# Patient Record
Sex: Male | Born: 1935 | Race: White | Hispanic: No | Marital: Married | Smoking: Former smoker
Health system: Southern US, Community
[De-identification: ages and names within clinical notes are randomized; demographics above are authoritative.]

## PROBLEM LIST (undated history)

## (undated) DIAGNOSIS — K210 Gastro-esophageal reflux disease with esophagitis: Secondary | ICD-10-CM

## (undated) DIAGNOSIS — M1990 Unspecified osteoarthritis, unspecified site: Secondary | ICD-10-CM

## (undated) DIAGNOSIS — Z8546 Personal history of malignant neoplasm of prostate: Secondary | ICD-10-CM

## (undated) DIAGNOSIS — I2510 Atherosclerotic heart disease of native coronary artery without angina pectoris: Secondary | ICD-10-CM

## (undated) DIAGNOSIS — E538 Deficiency of other specified B group vitamins: Secondary | ICD-10-CM

## (undated) DIAGNOSIS — L570 Actinic keratosis: Secondary | ICD-10-CM

## (undated) DIAGNOSIS — I10 Essential (primary) hypertension: Secondary | ICD-10-CM

## (undated) DIAGNOSIS — M1710 Unilateral primary osteoarthritis, unspecified knee: Secondary | ICD-10-CM

## (undated) DIAGNOSIS — M159 Polyosteoarthritis, unspecified: Secondary | ICD-10-CM

## (undated) DIAGNOSIS — K259 Gastric ulcer, unspecified as acute or chronic, without hemorrhage or perforation: Secondary | ICD-10-CM

## (undated) DIAGNOSIS — I872 Venous insufficiency (chronic) (peripheral): Secondary | ICD-10-CM

## (undated) DIAGNOSIS — E1159 Type 2 diabetes mellitus with other circulatory complications: Secondary | ICD-10-CM

## (undated) DIAGNOSIS — H3530 Unspecified macular degeneration: Secondary | ICD-10-CM

## (undated) DIAGNOSIS — Z86010 Personal history of colonic polyps: Secondary | ICD-10-CM

## (undated) DIAGNOSIS — G309 Alzheimer's disease, unspecified: Secondary | ICD-10-CM

## (undated) DIAGNOSIS — M542 Cervicalgia: Secondary | ICD-10-CM

## (undated) DIAGNOSIS — H409 Unspecified glaucoma: Secondary | ICD-10-CM

## (undated) DIAGNOSIS — F0280 Dementia in other diseases classified elsewhere without behavioral disturbance: Secondary | ICD-10-CM

## (undated) DIAGNOSIS — G40909 Epilepsy, unspecified, not intractable, without status epilepticus: Secondary | ICD-10-CM

## (undated) DIAGNOSIS — E785 Hyperlipidemia, unspecified: Secondary | ICD-10-CM

## (undated) DIAGNOSIS — H9190 Unspecified hearing loss, unspecified ear: Secondary | ICD-10-CM

## (undated) DIAGNOSIS — F329 Major depressive disorder, single episode, unspecified: Secondary | ICD-10-CM

## (undated) DIAGNOSIS — D649 Anemia, unspecified: Secondary | ICD-10-CM

## (undated) DIAGNOSIS — E559 Vitamin D deficiency, unspecified: Secondary | ICD-10-CM

## (undated) DIAGNOSIS — Z9181 History of falling: Secondary | ICD-10-CM

## (undated) HISTORY — DX: Epilepsy, unspecified, not intractable, without status epilepticus: G40.909

## (undated) HISTORY — DX: Unspecified hearing loss, unspecified ear: H91.90

## (undated) HISTORY — PX: MYRINGOTOMY: SHX2060

## (undated) HISTORY — DX: Gastric ulcer, unspecified as acute or chronic, without hemorrhage or perforation: K25.9

## (undated) HISTORY — DX: Essential (primary) hypertension: I10

## (undated) HISTORY — DX: Gastro-esophageal reflux disease with esophagitis: K21.0

## (undated) HISTORY — DX: Actinic keratosis: L57.0

## (undated) HISTORY — DX: Unilateral primary osteoarthritis, unspecified knee: M17.10

## (undated) HISTORY — DX: Type 2 diabetes mellitus with other circulatory complications: E11.59

## (undated) HISTORY — DX: History of falling: Z91.81

## (undated) HISTORY — DX: Cervicalgia: M54.2

## (undated) HISTORY — DX: Personal history of colonic polyps: Z86.010

## (undated) HISTORY — DX: Unspecified osteoarthritis, unspecified site: M19.90

## (undated) HISTORY — DX: Anemia, unspecified: D64.9

## (undated) HISTORY — DX: Deficiency of other specified B group vitamins: E53.8

## (undated) HISTORY — DX: Hyperlipidemia, unspecified: E78.5

## (undated) HISTORY — DX: Dementia in other diseases classified elsewhere without behavioral disturbance: F02.80

## (undated) HISTORY — DX: Polyosteoarthritis, unspecified: M15.9

## (undated) HISTORY — DX: Unspecified macular degeneration: H35.30

## (undated) HISTORY — DX: Major depressive disorder, single episode, unspecified: F32.9

## (undated) HISTORY — DX: Unspecified glaucoma: H40.9

## (undated) HISTORY — DX: Venous insufficiency (chronic) (peripheral): I87.2

## (undated) HISTORY — DX: Alzheimer's disease, unspecified: G30.9

## (undated) HISTORY — DX: Vitamin D deficiency, unspecified: E55.9

## (undated) HISTORY — DX: Atherosclerotic heart disease of native coronary artery without angina pectoris: I25.10

## (undated) HISTORY — DX: Personal history of malignant neoplasm of prostate: Z85.46

---
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
## 1996-07-22 DIAGNOSIS — M542 Cervicalgia: Secondary | ICD-10-CM

## 1996-07-22 HISTORY — DX: Cervicalgia: M54.2

## 1998-05-24 ENCOUNTER — Ambulatory Visit (HOSPITAL_COMMUNITY): Admission: RE | Admit: 1998-05-24 | Discharge: 1998-05-24 | Payer: Self-pay | Admitting: Family Medicine

## 2000-07-22 DIAGNOSIS — K2100 Gastro-esophageal reflux disease with esophagitis, without bleeding: Secondary | ICD-10-CM

## 2000-07-22 DIAGNOSIS — I2510 Atherosclerotic heart disease of native coronary artery without angina pectoris: Secondary | ICD-10-CM

## 2000-07-22 HISTORY — DX: Gastro-esophageal reflux disease with esophagitis, without bleeding: K21.00

## 2000-07-22 HISTORY — DX: Atherosclerotic heart disease of native coronary artery without angina pectoris: I25.10

## 2000-08-26 ENCOUNTER — Inpatient Hospital Stay (HOSPITAL_COMMUNITY): Admission: EM | Admit: 2000-08-26 | Discharge: 2000-08-28 | Payer: Self-pay | Admitting: Emergency Medicine

## 2000-08-26 ENCOUNTER — Encounter: Payer: Self-pay | Admitting: Emergency Medicine

## 2000-08-27 ENCOUNTER — Encounter: Payer: Self-pay | Admitting: Cardiology

## 2000-09-11 ENCOUNTER — Encounter: Payer: Self-pay | Admitting: Cardiology

## 2000-09-11 ENCOUNTER — Encounter: Admission: RE | Admit: 2000-09-11 | Discharge: 2000-09-11 | Payer: Self-pay | Admitting: Cardiology

## 2002-05-12 ENCOUNTER — Encounter: Payer: Self-pay | Admitting: Gastroenterology

## 2003-07-23 HISTORY — PX: CARPAL TUNNEL RELEASE: SHX101

## 2003-09-12 ENCOUNTER — Encounter: Admission: RE | Admit: 2003-09-12 | Discharge: 2003-09-12 | Payer: Self-pay | Admitting: Orthopedic Surgery

## 2003-09-13 ENCOUNTER — Ambulatory Visit (HOSPITAL_BASED_OUTPATIENT_CLINIC_OR_DEPARTMENT_OTHER): Admission: RE | Admit: 2003-09-13 | Discharge: 2003-09-13 | Payer: Self-pay | Admitting: Orthopedic Surgery

## 2003-09-13 ENCOUNTER — Ambulatory Visit (HOSPITAL_COMMUNITY): Admission: RE | Admit: 2003-09-13 | Discharge: 2003-09-13 | Payer: Self-pay | Admitting: Orthopedic Surgery

## 2003-11-08 ENCOUNTER — Ambulatory Visit: Admission: RE | Admit: 2003-11-08 | Discharge: 2003-11-08 | Payer: Self-pay | Admitting: Orthopedic Surgery

## 2003-11-08 ENCOUNTER — Ambulatory Visit (HOSPITAL_BASED_OUTPATIENT_CLINIC_OR_DEPARTMENT_OTHER): Admission: RE | Admit: 2003-11-08 | Discharge: 2003-11-08 | Payer: Self-pay | Admitting: Orthopedic Surgery

## 2006-07-22 DIAGNOSIS — I10 Essential (primary) hypertension: Secondary | ICD-10-CM

## 2006-07-22 HISTORY — DX: Essential (primary) hypertension: I10

## 2008-05-26 ENCOUNTER — Encounter: Admission: RE | Admit: 2008-05-26 | Discharge: 2008-05-26 | Payer: Self-pay | Admitting: Family Medicine

## 2008-07-22 DIAGNOSIS — E559 Vitamin D deficiency, unspecified: Secondary | ICD-10-CM

## 2008-07-22 HISTORY — DX: Vitamin D deficiency, unspecified: E55.9

## 2008-09-13 DIAGNOSIS — F0280 Dementia in other diseases classified elsewhere without behavioral disturbance: Secondary | ICD-10-CM

## 2008-09-13 HISTORY — DX: Dementia in other diseases classified elsewhere, unspecified severity, without behavioral disturbance, psychotic disturbance, mood disturbance, and anxiety: F02.80

## 2008-10-06 ENCOUNTER — Encounter: Payer: Self-pay | Admitting: Internal Medicine

## 2009-04-20 ENCOUNTER — Encounter: Admission: RE | Admit: 2009-04-20 | Discharge: 2009-04-20 | Payer: Self-pay | Admitting: Family Medicine

## 2009-05-18 ENCOUNTER — Ambulatory Visit: Payer: Self-pay

## 2009-05-18 ENCOUNTER — Encounter: Payer: Self-pay | Admitting: Internal Medicine

## 2009-05-18 ENCOUNTER — Ambulatory Visit (HOSPITAL_COMMUNITY): Admission: RE | Admit: 2009-05-18 | Discharge: 2009-05-18 | Payer: Self-pay | Admitting: Internal Medicine

## 2009-05-18 ENCOUNTER — Ambulatory Visit: Payer: Self-pay | Admitting: Cardiovascular Disease

## 2009-05-24 ENCOUNTER — Ambulatory Visit: Payer: Self-pay | Admitting: Internal Medicine

## 2009-05-24 DIAGNOSIS — R413 Other amnesia: Secondary | ICD-10-CM

## 2009-05-24 DIAGNOSIS — E1159 Type 2 diabetes mellitus with other circulatory complications: Secondary | ICD-10-CM

## 2009-05-24 DIAGNOSIS — R55 Syncope and collapse: Secondary | ICD-10-CM

## 2009-05-24 DIAGNOSIS — E119 Type 2 diabetes mellitus without complications: Secondary | ICD-10-CM

## 2009-05-24 DIAGNOSIS — I10 Essential (primary) hypertension: Secondary | ICD-10-CM

## 2009-05-24 HISTORY — DX: Type 2 diabetes mellitus with other circulatory complications: E11.59

## 2009-05-29 ENCOUNTER — Ambulatory Visit: Payer: Self-pay | Admitting: Internal Medicine

## 2009-06-07 ENCOUNTER — Telehealth: Payer: Self-pay | Admitting: Internal Medicine

## 2009-06-21 ENCOUNTER — Encounter (INDEPENDENT_AMBULATORY_CARE_PROVIDER_SITE_OTHER): Payer: Self-pay | Admitting: *Deleted

## 2009-07-04 ENCOUNTER — Telehealth: Payer: Self-pay | Admitting: Internal Medicine

## 2009-07-11 ENCOUNTER — Telehealth (INDEPENDENT_AMBULATORY_CARE_PROVIDER_SITE_OTHER): Payer: Self-pay | Admitting: *Deleted

## 2009-07-22 DIAGNOSIS — Z8546 Personal history of malignant neoplasm of prostate: Secondary | ICD-10-CM

## 2009-07-22 HISTORY — DX: Personal history of malignant neoplasm of prostate: Z85.46

## 2009-08-21 ENCOUNTER — Encounter: Admission: RE | Admit: 2009-08-21 | Discharge: 2009-08-21 | Payer: Self-pay | Admitting: Family Medicine

## 2009-08-21 ENCOUNTER — Encounter: Payer: Self-pay | Admitting: Gastroenterology

## 2009-10-02 ENCOUNTER — Encounter: Payer: Self-pay | Admitting: Gastroenterology

## 2009-10-11 ENCOUNTER — Encounter: Payer: Self-pay | Admitting: Gastroenterology

## 2009-10-12 ENCOUNTER — Telehealth: Payer: Self-pay | Admitting: Gastroenterology

## 2009-11-21 ENCOUNTER — Encounter: Admission: RE | Admit: 2009-11-21 | Discharge: 2009-11-21 | Payer: Self-pay | Admitting: Neurology

## 2010-01-10 ENCOUNTER — Ambulatory Visit (HOSPITAL_COMMUNITY): Admission: RE | Admit: 2010-01-10 | Discharge: 2010-01-10 | Payer: Self-pay | Admitting: Urology

## 2010-01-18 ENCOUNTER — Ambulatory Visit (HOSPITAL_COMMUNITY): Admission: RE | Admit: 2010-01-18 | Discharge: 2010-01-18 | Payer: Self-pay | Admitting: Urology

## 2010-02-12 ENCOUNTER — Ambulatory Visit: Admission: RE | Admit: 2010-02-12 | Discharge: 2010-05-13 | Payer: Self-pay | Admitting: Radiation Oncology

## 2010-02-26 ENCOUNTER — Encounter: Payer: Self-pay | Admitting: Gastroenterology

## 2010-05-14 ENCOUNTER — Ambulatory Visit
Admission: RE | Admit: 2010-05-14 | Discharge: 2010-07-12 | Payer: Self-pay | Source: Home / Self Care | Attending: Radiation Oncology | Admitting: Radiation Oncology

## 2010-07-21 ENCOUNTER — Emergency Department (HOSPITAL_COMMUNITY)
Admission: EM | Admit: 2010-07-21 | Discharge: 2010-07-22 | Payer: Self-pay | Source: Home / Self Care | Admitting: Emergency Medicine

## 2010-07-22 DIAGNOSIS — D649 Anemia, unspecified: Secondary | ICD-10-CM

## 2010-07-22 DIAGNOSIS — Z860100 Personal history of colon polyps, unspecified: Secondary | ICD-10-CM

## 2010-07-22 DIAGNOSIS — K259 Gastric ulcer, unspecified as acute or chronic, without hemorrhage or perforation: Secondary | ICD-10-CM

## 2010-07-22 DIAGNOSIS — E538 Deficiency of other specified B group vitamins: Secondary | ICD-10-CM

## 2010-07-22 HISTORY — DX: Personal history of colon polyps, unspecified: Z86.0100

## 2010-07-22 HISTORY — DX: Anemia, unspecified: D64.9

## 2010-07-22 HISTORY — DX: Deficiency of other specified B group vitamins: E53.8

## 2010-07-22 HISTORY — DX: Gastric ulcer, unspecified as acute or chronic, without hemorrhage or perforation: K25.9

## 2010-07-22 HISTORY — DX: Personal history of colonic polyps: Z86.010

## 2010-07-30 ENCOUNTER — Encounter
Admission: RE | Admit: 2010-07-30 | Discharge: 2010-07-30 | Payer: Self-pay | Source: Home / Self Care | Attending: Family Medicine | Admitting: Family Medicine

## 2010-07-30 ENCOUNTER — Encounter: Payer: Self-pay | Admitting: Gastroenterology

## 2010-08-06 ENCOUNTER — Encounter: Payer: Self-pay | Admitting: Gastroenterology

## 2010-08-13 ENCOUNTER — Encounter
Admission: RE | Admit: 2010-08-13 | Discharge: 2010-08-13 | Payer: Self-pay | Source: Home / Self Care | Attending: Family Medicine | Admitting: Family Medicine

## 2010-08-13 ENCOUNTER — Encounter: Payer: Self-pay | Admitting: Gastroenterology

## 2010-08-14 ENCOUNTER — Encounter: Payer: Self-pay | Admitting: Gastroenterology

## 2010-08-16 ENCOUNTER — Encounter: Payer: Self-pay | Admitting: Gastroenterology

## 2010-08-17 ENCOUNTER — Encounter: Payer: Self-pay | Admitting: Gastroenterology

## 2010-08-21 NOTE — Progress Notes (Signed)
Summary: ? Colonoscopy vs Hemocults - Dr. Arnoldo Morale  Phone Note From Other Clinic Call back at [phone]   Caller: Nurse-Sarah Dr. Arnoldo Morale  Summary of Call: Dr. Duaine Dredge would like to know what kind of screening Dr Jarold Motto would recommend at this time for [name]. He is not currently having any problems. Patients last colon was 2003 (chart requested to your desk). Dr. Arnoldo Morale would like to know if patient needs yearly hemocults or an office visit with Dr. Jarold Motto to discuss possible colonoscopy. Initial call taken by: Harlow Mares CMA Duncan Dull),  October 12, 2009 4:30 PM  Follow-up for Phone Call        Dr. Jarold Motto reviewed pt's chart.  Pt does not need colonoscopy unless he is having a problem. Follow-up by: Ashok Cordia RN,  October 13, 2009 4:21 PM  Additional Follow-up for Phone Call Additional follow up Details #1::        Sarah notified. Additional Follow-up by: Ashok Cordia RN,  October 13, 2009 4:25 PM

## 2010-08-22 ENCOUNTER — Ambulatory Visit: Payer: Medicare Other | Attending: Radiation Oncology | Admitting: Radiation Oncology

## 2010-08-23 NOTE — Letter (Signed)
Summary: New Patient letter  [facility]  [address] [place], [place] [address]   Phone: [phone]  Fax: [phone]       08/16/2010 MRN: [id number]  [facility] [address] [place], [place]  [address]  Dear [name],  Welcome to the Gastroenterology Division at [facility].    You are scheduled to see Dr.  Jarold Motto on 09-18-10 at 1:30pm  on the 3rd floor at [facility], [address]  We ask that you try to arrive at our office 15 minutes prior to your appointment time to allow for check-in.  We would like you to complete the enclosed self-administered evaluation form prior to your visit and bring it with you on the day of your appointment.  We will review it with you.  Also, please bring a complete list of all your medications or, if you prefer, bring the medication bottles and we will list them.  Please bring your insurance card so that we may make a copy of it.  If your insurance requires a referral to see a specialist, please bring your referral form from your primary care physician.  Co-payments are due at the time of your visit and may be paid by cash, check or credit card.     Your office visit will consist of a consult with your physician (includes a physical exam), any laboratory testing he/she may order, scheduling of any necessary diagnostic testing (e.g. x-ray, ultrasound, CT-scan), and scheduling of a procedure (e.g. Endoscopy, Colonoscopy) if required.  Please allow enough time on your schedule to allow for any/all of these possibilities.    If you cannot keep your appointment, please call [phone] to cancel or reschedule prior to your appointment date.  This allows [place] the opportunity to schedule an appointment for another patient in need of care.  If you do not cancel or reschedule by 5 p.m. the business day prior to your appointment date, you will be charged a $50.00 late cancellation/no-show fee.    Thank you for choosing  [facility] for your medical needs.  We appreciate the opportunity to care for you.  Please visit [place] at our website  to learn more about our practice.                     Sincerely,                                                             The Gastroenterology Division

## 2010-08-28 ENCOUNTER — Encounter: Payer: Self-pay | Admitting: Gastroenterology

## 2010-09-13 DIAGNOSIS — Z860100 Personal history of colon polyps, unspecified: Secondary | ICD-10-CM | POA: Insufficient documentation

## 2010-09-13 DIAGNOSIS — D649 Anemia, unspecified: Secondary | ICD-10-CM | POA: Insufficient documentation

## 2010-09-13 DIAGNOSIS — E559 Vitamin D deficiency, unspecified: Secondary | ICD-10-CM | POA: Insufficient documentation

## 2010-09-13 DIAGNOSIS — N402 Nodular prostate without lower urinary tract symptoms: Secondary | ICD-10-CM | POA: Insufficient documentation

## 2010-09-13 DIAGNOSIS — H9190 Unspecified hearing loss, unspecified ear: Secondary | ICD-10-CM | POA: Insufficient documentation

## 2010-09-13 DIAGNOSIS — R569 Unspecified convulsions: Secondary | ICD-10-CM | POA: Insufficient documentation

## 2010-09-13 DIAGNOSIS — I10 Essential (primary) hypertension: Secondary | ICD-10-CM | POA: Insufficient documentation

## 2010-09-13 DIAGNOSIS — I2510 Atherosclerotic heart disease of native coronary artery without angina pectoris: Secondary | ICD-10-CM | POA: Insufficient documentation

## 2010-09-18 ENCOUNTER — Ambulatory Visit (INDEPENDENT_AMBULATORY_CARE_PROVIDER_SITE_OTHER): Payer: Medicare Other | Admitting: Gastroenterology

## 2010-09-18 ENCOUNTER — Encounter: Payer: Self-pay | Admitting: Gastroenterology

## 2010-09-18 ENCOUNTER — Encounter (INDEPENDENT_AMBULATORY_CARE_PROVIDER_SITE_OTHER): Payer: Self-pay | Admitting: *Deleted

## 2010-09-18 DIAGNOSIS — D509 Iron deficiency anemia, unspecified: Secondary | ICD-10-CM | POA: Insufficient documentation

## 2010-09-18 NOTE — Procedures (Signed)
Summary: Colonoscopy   Colonoscopy  Procedure date:  05/12/2002  Findings:      Location:  [facility].   Patient Name: [name], Strauch MRN: [id number] Procedure Procedures: Colonoscopy CPT: [phone].    with polypectomy. CPT: [id number].  Personnel: Endoscopist: Vania Rea. Jarold Motto, MD.  Exam Location: Exam performed in Outpatient Clinic. Outpatient  Patient Consent: Procedure, Alternatives, Risks and Benefits discussed, consent obtained, from patient. Consent was obtained by the RN.  Indications  Average Risk Screening Routine.  History  Pre-Exam Physical: Performed May 12, 2002. Cardio-pulmonary exam, Rectal exam, Abdominal exam, Extremity exam, Mental status exam WNL.  Exam Exam: Extent of exam reached: Cecum, extent intended: Cecum.  The cecum was identified by appendiceal orifice and IC valve. Patient position: on left side. Duration of exam: 30 minutes. Colon retroflexion performed. Images taken. ASA Classification: I. Tolerance: excellent.  Monitoring: Pulse and BP monitoring, Oximetry used. Supplemental O2 given.  Colon Prep Used Golytely for colon prep. Prep results: excellent.  Sedation Meds: Patient assessed and found to be appropriate for moderate (conscious) sedation. Fentanyl 75 mcg. given IV. Versed 5 mg. given IV.  Instrument(s): CF 140L. Serial [id number].  Findings - MULTIPLE POLYPS: Transverse Colon to Descending Colon. minimum size 2 mm, maximum size 5 mm. Procedure:  snare with cautery, removed, Polyp retrieved, ICD9: Colon Polyps: 211.3.  - DIVERTICULOSIS: Descending Colon to Sigmoid Colon. Not bleeding. ICD9: Diverticulosis, Colon: 562.10.   Assessment  Diagnoses: 211.3: Colon Polyps.  562.10: Diverticulosis, Colon.   Events  Unplanned Interventions: No intervention was required.  Plans  Post Exam Instructions: No aspirin or non-steroidal containing medications: 2 weeks.  Medication Plan: Continue current  medications.  Patient Education: Patient given standard instructions for: Polyps. Diverticulosis. Patient instructed to get routine colonoscopy every 3 years.  Disposition: After procedure patient sent to recovery. After recovery patient sent home.  Scheduling/Referral: Follow-Up prn. Await pathology to schedule patient.    CC: Dahlia Client, MD  This report was created from the original endoscopy report, which was reviewed and signed by the above listed endoscopist.

## 2010-09-27 NOTE — Letter (Signed)
Summary: [facility] Instructions  [facility]  [address] [place], [place] [address]   Phone: [phone]  Fax: [phone]       [name]    1922-01-20    MRN: [id number]        Procedure Day /Date: Monday 10/22/2010     Arrival Time: 9:30am     Procedure Time: 10:30am     Location of Procedure:                    [facility] (4th Floor)  PREPARATION FOR COLONOSCOPY WITH MOVIPREP   Starting 5 days prior to your procedure 10/17/2010 do not eat nuts, seeds, popcorn, corn, beans, peas,  salads, or any raw vegetables.  Do not take any fiber supplements (e.g. Metamucil, Citrucel, and Benefiber).  THE DAY BEFORE YOUR PROCEDURE        Sunday 10/21/2010  1.  Drink clear liquids the entire day-NO SOLID FOOD  2.  Do not drink anything colored red or purple.  Avoid juices with pulp.  No orange juice.  3.  Drink at least 64 oz. (8 glasses) of fluid/clear liquids during the day to prevent dehydration and help the prep work efficiently.  CLEAR LIQUIDS INCLUDE: Water Jello Ice Popsicles Tea (sugar ok, no milk/cream) Powdered fruit flavored drinks Coffee (sugar ok, no milk/cream) Gatorade Juice: apple, white grape, white cranberry  Lemonade Clear bullion, consomm, broth Carbonated beverages (any kind) Strained chicken noodle soup Hard Candy                             4.  In the morning, mix first dose of MoviPrep solution:    Empty 1 Pouch A and 1 Pouch B into the disposable container    Add lukewarm drinking water to the top line of the container. Mix to dissolve    Refrigerate (mixed solution should be used within 24 hrs)  5.  Begin drinking the prep at 5:00 p.m. The MoviPrep container is divided by 4 marks.   Every 15 minutes drink the solution down to the next mark (approximately 8 oz) until the full liter is complete.   6.  Follow completed prep with 16 oz of clear liquid of your choice (Nothing red or purple).  Continue to drink  clear liquids until bedtime.  7.  Before going to bed, mix second dose of MoviPrep solution:    Empty 1 Pouch A and 1 Pouch B into the disposable container    Add lukewarm drinking water to the top line of the container. Mix to dissolve    Refrigerate  THE DAY OF YOUR PROCEDURE      Monday 10/22/2010  Beginning at 5:30am (5 hours before procedure):         1. Every 15 minutes, drink the solution down to the next mark (approx 8 oz) until the full liter is complete.  2. Follow completed prep with 16 oz. of clear liquid of your choice.    3. You may drink clear liquids until 8:30am (2 HOURS BEFORE PROCEDURE).   MEDICATION INSTRUCTIONS  Unless otherwise instructed, you should take regular prescription medications with a small sip of water   as early as possible the morning of your procedure.          OTHER INSTRUCTIONS  You will need a responsible adult at least 75 years of age to accompany you and drive you home.  This person must remain in the waiting room during your procedure.  Wear loose fitting clothing that is easily removed.  Leave jewelry and other valuables at home.  However, you may wish to bring a book to read or  an iPod/MP3 player to listen to music as you wait for your procedure to start.  Remove all body piercing jewelry and leave at home.  Total time from sign-in until discharge is approximately 2-3 hours.  You should go home directly after your procedure and rest.  You can resume normal activities the  day after your procedure.  The day of your procedure you should not:   Drive   Make legal decisions   Operate machinery   Drink alcohol   Return to work  You will receive specific instructions about eating, activities and medications before you leave.    The above instructions have been reviewed and explained to me by   _______________________    I fully understand and can verbalize these instructions _____________________________ Date  _________

## 2010-09-27 NOTE — Letter (Signed)
Summary: [facility]   Imported By: Lennie Odor 09/20/2010 12:29:07  _____________________________________________________________________  External Attachment:    Type:   Image     Comment:   External Document

## 2010-09-27 NOTE — Assessment & Plan Note (Signed)
Summary: HEM POS STOOLS/SCHED W-KRISTI [phone]/INS MEDICARE/MAILER SE...   History of Present Illness Visit Type: Initial Consult Primary GI MD: Sheryn Bison MD FACP FAGA Primary Provider: Mosetta Putt, MD Requesting Provider: Mosetta Putt, MD Chief Complaint: Anemia- hem + stool History of Present Illness:   Extremity Pleasant 77 year old Caucasian male resident of [facility]. He is referred for evaluation of guaiac positive stools, iron deficiency anemia with hemoglobin 10.7 with normal white cell count and platelet count. The patient denies any GI complaints whatsoever, specifically irregular bowel habits, melena, hematochezia, upper gastrointestinal or hepatobiliary symptoms. I did do a screening colonoscopy on this patient in October 2003, and he had diverticulosis and several small adenomatous polyps in the transverse colon that were excised. Followup colonoscopy was not completed in 2006 as requested.  Recent excellent workup by Dr. Mosetta Putt showed that the patient's hemoglobin has been as low as 9.8 normal B12 and folate levels. Patient has had prostate cancer, and was treated with external beam radiation therapy that was completed in December of 2011. He denies any genitourinary complaints at this time.He has some memory problems but does not have severe dementia. His interview and exam today was in the presence of his son Arlys John. He has not had previous abdominal surgical procedures. Family history is remarkable for longevity in both parents to the age of 78. There is some question as to whether or not his father may have had colon carcinoma.   GI Review of Systems      Denies abdominal pain, acid reflux, belching, bloating, chest pain, dysphagia with liquids, dysphagia with solids, heartburn, loss of appetite, nausea, vomiting, vomiting blood, weight loss, and  weight gain.      Reports heme positive stool.     Denies anal fissure, black tarry stools,  change in bowel habit, constipation, diarrhea, diverticulosis, fecal incontinence, hemorrhoids, irritable bowel syndrome, jaundice, light color stool, liver problems, rectal bleeding, and  rectal pain.    Current Medications (verified): 1)  Altace 10 Mg Caps (Ramipril) .... Take One Tablet Once Daily 2)  Lipitor 10 Mg Tabs (Atorvastatin Calcium) .... Take One Tablet Once Daily 3)  Toprol Xl 100 Mg Xr24h-Tab (Metoprolol Succinate) .... Once Daily 4)  Amlodipine Besylate 2.5 Mg Tabs (Amlodipine Besylate) .... Take One Tablet Once Daily 5)  Ferrous Sulfate 325 (65 Fe) Mg Tabs (Ferrous Sulfate) .... Take 1 Tablet By Mouth Once Daily  Allergies (verified): No Known Drug Allergies  Past History:  Past medical, surgical, family and social histories (including risk factors) reviewed for relevance to current acute and chronic problems.  Past Medical History: Current Problems:  VITAMIN D DEFICIENCY (ICD-268.9) ANEMIA (ICD-285.9) SEIZURE DISORDER (ICD-780.39) OSTEOARTHRITIS (ICD-715.90) NODULAR PROSTATE WITHOUT URINARY OBSTRUCTION (ICD-600.10) HYPERTENSION (ICD-401.9) COLONIC POLYPS, ADENOMATOUS, HX OF (ICD-V12.72) HEARING LOSS (ICD-389.9) DEMENTIA (ICD-294.8) CAD (ICD-414.00) SYNCOPE (ICD-780.2) MEMORY LOSS (ICD-780.93) DM (ICD-250.00) HYPERTENSION, BENIGN (ICD-401.1) Hyperlipidemia  Past Surgical History: Reviewed history from 05/24/2009 and no changes required.  History of appendectomy in the 1940s tonsillectomy Carpal Tunnel Release  Family History: Reviewed history from 09/13/2010 and no changes required. Both of his parents are deceased at the age of 76 and died of natural causes Family History of Heart Disease: mother, sister  Social History: Reviewed history from 05/18/2009 and no changes required. 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 -quit many years ago, 20+ Alcohol Use - yes-every evening 1-2 glasses of scotch at dinner Drug Use - no Daily  Caffeine Use  Review of Systems  The patient complains of confusion and hearing problems.  The patient denies allergy/sinus, anemia, anxiety-new, arthritis/joint pain, back pain, blood in urine, breast changes/lumps, change in vision, cough, coughing up blood, depression-new, fainting, fatigue, fever, headaches-new, heart murmur, heart rhythm changes, itching, menstrual pain, muscle pains/cramps, night sweats, nosebleeds, pregnancy symptoms, shortness of breath, skin rash, sleeping problems, sore throat, swelling of feet/legs, swollen lymph glands, thirst - excessive , urination - excessive , urination changes/pain, urine leakage, vision changes, and voice change.    Vital Signs:  Patient profile:   77 year old male Height:      70.5 inches Weight:      177.50 pounds BMI:     25.20 Pulse rate:   56 / minute Pulse rhythm:   regular BP sitting:   100 / 52  (left arm) Cuff size:   regular  Vitals Entered By: June McMurray CMA Duncan Dull) (September 18, 2010 1:22 PM)  Physical Exam  General:  Well developed, well nourished, no acute distress.Pale appearing male who is hard of hearing and has difficulty with ambulation. Head:  Normocephalic and atraumatic. Eyes:  PERRLA, no icterus.exam deferred to patient's ophthalmologist.   Neck:  Supple; no masses or thyromegaly. Lungs:  Clear throughout to auscultation. Heart:  Regular rate and rhythm; no murmurs, rubs,  or bruits. Abdomen:  Soft, nontender and nondistended. No masses, hepatosplenomegaly or hernias noted. Normal bowel sounds. Rectal:  Normal exam.stool guaiac negative on my exam. Prostate:  .normal size prostate.   Msk:  decreased ROM and arthritic changes.   Extremities:  No clubbing, cyanosis, edema or deformities noted.trace pedal edema.   Neurologic:  Alert and  oriented x4;  grossly normal neurologically. Cervical Nodes:  No significant cervical adenopathy. Psych:  Alert and cooperative. Normal mood and affect.   Impression &  Recommendations:  Problem # 1:  ANEMIA-IRON DEFICIENCY (ICD-280.9) Assessment New Rule Out recurrent colon polyps in an elderly patient who had removal of several adenomatous polyps in 2003. He currently denies any GI complaints and appears to be in excellent health per his age. I have scheduled colonoscopy with a balanced electrolyte solution preparation, and also plan endoscopy with appropriate biopsies if indicated to rule out H. pylori infection and/or celiac disease. The patient denies use of alcohol, cigarettes, or NSAIDs. He currently is on iron replacement therapy and his hemoglobin seems to be rising. He gives no symptoms suggestive of radiation proctitis.  Problem # 2:  VITAMIN D DEFICIENCY (ICD-268.9) Assessment: Unchanged Consider chronic malabsorption, i.e. celiac disease depending on endoscopic results.  Problem # 3:  OSTEOARTHRITIS (ICD-715.90) Assessment: Unchanged  Problem # 4:  HYPERTENSION, BENIGN (ICD-401.1) Assessment: Improved Blood pressure today 100/52 with pulse 56 and regular.Continue other medications per Dr. Duaine Dredge  Patient Instructions: 1)  Copy sent to : Mosetta Putt, MD 2)  We printed you and gave your rx for your prep to you today.  3)  [facility] Patient Information Guide given to patient.  4)  Colonoscopy and Flexible Sigmoidoscopy brochure given.  5)  Upper Endoscopy brochure given.  6)  Your procedure has been scheduled for 10/22/2010, please follow the seperate instructions.  7)  The medication list was reviewed and reconciled.  All changed / newly prescribed medications were explained.  A complete medication list was provided to the patient / caregiver. Prescriptions: MOVIPREP 100 GM  SOLR (PEG-KCL-NACL-NASULF-NA ASC-C) As per prep instructions.  #1 x 0   Entered by:   Harlow Mares CMA (AAMA)   Authorized by:  Mardella Layman MD [facility]   Signed by:   Harlow Mares CMA Duncan Dull) on 09/18/2010   Method used:   Print then Give to  Patient   RxID:   [id number]   Appended Document: Orders Update    Clinical Lists Changes  Orders: Added new Test order of Colon/Endo (Colon/Endo) - Signed

## 2010-09-27 NOTE — Letter (Signed)
Summary: [facility]   Imported By: Lennie Odor 09/20/2010 12:26:37  _____________________________________________________________________  External Attachment:    Type:   Image     Comment:   External Document

## 2010-09-27 NOTE — Letter (Signed)
Summary: [facility]   Imported By: Lennie Odor 09/20/2010 12:27:57  _____________________________________________________________________  External Attachment:    Type:   Image     Comment:   External Document

## 2010-09-27 NOTE — Letter (Signed)
Summary: [facility]   Imported By: Lennie Odor 09/20/2010 12:27:11  _____________________________________________________________________  External Attachment:    Type:   Image     Comment:   External Document

## 2010-09-27 NOTE — Letter (Signed)
Summary: [facility]   Imported By: Lennie Odor 09/20/2010 12:26:01  _____________________________________________________________________  External Attachment:    Type:   Image     Comment:   External Document

## 2010-10-01 LAB — OCCULT BLOOD, POC DEVICE: Fecal Occult Bld: NEGATIVE

## 2010-10-01 LAB — URINALYSIS, ROUTINE W REFLEX MICROSCOPIC
Glucose, UA: NEGATIVE mg/dL
Specific Gravity, Urine: 1.01 (ref 1.005–1.030)
pH: 5.5 (ref 5.0–8.0)

## 2010-10-01 LAB — URINE MICROSCOPIC-ADD ON

## 2010-10-01 LAB — POCT I-STAT, CHEM 8
Chloride: 110 mEq/L (ref 96–112)
Creatinine, Ser: 1.3 mg/dL (ref 0.4–1.5)
Hemoglobin: 9.9 g/dL — ABNORMAL LOW (ref 13.0–17.0)
Potassium: 3.5 mEq/L (ref 3.5–5.1)

## 2010-10-22 ENCOUNTER — Other Ambulatory Visit: Payer: Self-pay | Admitting: Gastroenterology

## 2010-10-22 ENCOUNTER — Ambulatory Visit (AMBULATORY_SURGERY_CENTER): Payer: Medicare Other | Admitting: Gastroenterology

## 2010-10-22 ENCOUNTER — Encounter: Payer: Self-pay | Admitting: Gastroenterology

## 2010-10-22 VITALS — BP 125/71 | HR 67 | Temp 97.8°F | Resp 18 | Ht 70.5 in | Wt 178.0 lb

## 2010-10-22 DIAGNOSIS — K254 Chronic or unspecified gastric ulcer with hemorrhage: Secondary | ICD-10-CM

## 2010-10-22 DIAGNOSIS — R6889 Other general symptoms and signs: Secondary | ICD-10-CM

## 2010-10-22 DIAGNOSIS — K2960 Other gastritis without bleeding: Secondary | ICD-10-CM

## 2010-10-22 DIAGNOSIS — K2970 Gastritis, unspecified, without bleeding: Secondary | ICD-10-CM

## 2010-10-22 DIAGNOSIS — K219 Gastro-esophageal reflux disease without esophagitis: Secondary | ICD-10-CM

## 2010-10-22 DIAGNOSIS — D126 Benign neoplasm of colon, unspecified: Secondary | ICD-10-CM

## 2010-10-22 DIAGNOSIS — K635 Polyp of colon: Secondary | ICD-10-CM

## 2010-10-22 DIAGNOSIS — D509 Iron deficiency anemia, unspecified: Secondary | ICD-10-CM

## 2010-10-22 DIAGNOSIS — K648 Other hemorrhoids: Secondary | ICD-10-CM

## 2010-10-22 DIAGNOSIS — K2980 Duodenitis without bleeding: Secondary | ICD-10-CM

## 2010-10-22 HISTORY — PX: COLONOSCOPY: SHX174

## 2010-10-22 LAB — GLUCOSE, CAPILLARY: Glucose-Capillary: 114 mg/dL — ABNORMAL HIGH (ref 70–99)

## 2010-10-22 MED ORDER — RABEPRAZOLE SODIUM 20 MG PO TBEC: 20.0000 mg | DELAYED_RELEASE_TABLET | Freq: Every day | ORAL | Status: DC

## 2010-10-22 NOTE — Patient Instructions (Signed)
Reviewed [place] Discharge Instructions with patient and son/daughter-in-law.  Discharge instructions signed by care partner.  Handouts given for Gastritis, Polyps, Hemorrhoids.  Impression/Recommendations:  Endoscopy:  Moderate gastritis, probable NSAID damage, rule out atypia, rule out H. Pylori. Avoid NSAIDS for two weeks. Await biopsy results. You have a new medication added, start Aciphex every morning.  Colonoscopy:  Multiple polyps left and right colon. Await biopsy results for rule out of adenomas.

## 2010-10-23 ENCOUNTER — Telehealth: Payer: Self-pay

## 2010-10-23 DIAGNOSIS — K2990 Gastroduodenitis, unspecified, without bleeding: Secondary | ICD-10-CM

## 2010-10-23 DIAGNOSIS — K2970 Gastritis, unspecified, without bleeding: Secondary | ICD-10-CM

## 2010-10-23 LAB — HELICOBACTER PYLORI SCREEN-BIOPSY: UREASE: NEGATIVE

## 2010-10-23 NOTE — Telephone Encounter (Signed)

## 2010-10-30 ENCOUNTER — Encounter: Payer: Self-pay | Admitting: Gastroenterology

## 2010-11-20 ENCOUNTER — Encounter: Payer: Self-pay | Admitting: Gastroenterology

## 2010-11-21 ENCOUNTER — Encounter: Payer: Self-pay | Admitting: Gastroenterology

## 2010-12-07 NOTE — Op Note (Signed)
NAMEHADDEN, STEIG                         ACCOUNT NO.:  [id number]   MEDICAL RECORD NO.:  [id number]                   PATIENT TYPE:  AMB   LOCATION:  DSC                                  FACILITY:  [facility]   PHYSICIAN:  Katy Fitch. Naaman Plummer., M.D.          DATE OF BIRTH:  1921/11/14   DATE OF PROCEDURE:  11/08/2003  DATE OF DISCHARGE:                                 OPERATIVE REPORT   PREOPERATIVE DIAGNOSIS:  Chronic entrapment neuropathy, right median nerve  at carpal tunnel, severe.   POSTOPERATIVE DIAGNOSIS:  Chronic entrapment neuropathy, right median nerve  at carpal tunnel, severe.   PROCEDURE:  Release of right transverse carpal ligament.   SURGEON:  Katy Fitch. Sypher, M.D.   ASSISTANT:  Jonni Sanger, P.A.   ANESTHESIA:  IV regional at proximal forearm level.   SUPERVISING ANESTHESIOLOGIST:  Bedelia Person, M.D.   INDICATIONS:  [name] is an 77 year old right-handed dominant  gentleman referred for evaluation and management of chronic hand numbness.   Electrodiagnostic studies revealed profound bilateral carpal tunnel  syndrome, right worse than left.   On August 24, 2003, he underwent left catheter with an excellent early  result.  He now returns for right carpal tunnel release.   DESCRIPTION OF PROCEDURE:  After informed consent he is brought to the  operating room at this time.  Mr. Siemen has type II diabetes that is well  managed.  He is brought to the operating room and placed in the supine  position on the operating room table.   Following light sedation, the right arm was prepped with Betadine soap and  solution and sterilely draped.  Following exsanguination of the right arm,  IV lidocaine was placed and after approximately 10 minutes excellent  anesthesia was achieved.  The arm was prepped with Betadine soap and  solution and sterilely draped.   The procedure commenced with a short incision in the line of the ring finger  and palm.   Subcutaneous tissues were carefully divided in the palmar fascia.  This was split longitudinally to reveal the common sensory branches of the  median nerve.  These were followed back to the transverse carpal ligament.  The ligament was gently isolated from the median nerve with a Child psychotherapist.  After isolation of the transverse carpal ligament, the ligament was released  along its ulnar border with scissors extending into the distal forearm.  This widely opened the carpal canal.  No masses or other abnormalities were  noted.   Bleeding points along the margin of the released ligament were  electrocauterized with bipolar current.  The wound was inspected for masses  or other predicaments and none were identified.   The wound was then repaired with intradermal 3-0 Prolene suture.  A  compressive dressing was applied with a volar plaster splint maintaining the  wrist in 5 degrees of dorsiflexion.  There were no apparent complications.  Mr. Leavitt tolerated the surgery and anesthesia well.  He was transferred to  the recovery room with stable vital signs.   For after care, he is given a prescription for Percocet 5 mg one or two  tablets p.o. q.4-6h. p.r.n., 20 tablets without refill.  He will return to  the office for followup in approximately seven days to have his sutures  removed and advanced through an exercise program.                                               Katy Fitch. Naaman Plummer., M.D.    RVS/MEDQ  D:  11/08/2003  T:  11/09/2003  Job:  [id number]

## 2010-12-07 NOTE — Op Note (Signed)
NAMELEGRAND, LASSER                         ACCOUNT NO.:  [id number]   MEDICAL RECORD NO.:  [id number]                   PATIENT TYPE:  AMB   LOCATION:  DSC                                  FACILITY:  [facility]   PHYSICIAN:  Katy Fitch. Naaman Plummer., M.D.          DATE OF BIRTH:  April 20, 1922   DATE OF PROCEDURE:  09/13/2003  DATE OF DISCHARGE:                                 OPERATIVE REPORT   PREOPERATIVE DIAGNOSES:  Severe bilateral carpal tunnel syndrome.   POSTOPERATIVE DIAGNOSES:  Severe bilateral carpal tunnel syndrome.   OPERATION PERFORMED:  Release of left transverse carpal ligament.   SURGEON:  Katy Fitch. Sypher, M.D.   ASSISTANT:  Jonni Sanger, P.A.   ANESTHESIA:  IV regional to proximal forearm level.   SUPERVISING ANESTHESIOLOGIST:  Kaylyn Layer. Michelle Piper, M.D.   INDICATIONS FOR PROCEDURE:  [name] is an 77 year old right-handed  dominant oil Engineer, mining who presented for evaluation and management of  severe bilateral carpal tunnel syndrome.  He has a history of type 2  diabetes.  He has had progressive numbness in his hands for years.  Clinical  examination suggested severe bilateral carpal tunnel syndrome with thenar  atrophy on the right.  Electrodiagnostic studies confirmed profound ulnar  neuropathy on the right with unrecordable motor latencies and severe  prolongation of the motor and sensory latencies on the left.  Mr. Cudworth  chose left side surgery at this time.  After informed consent he was brought  to the operating room anticipating release of the left transverse carpal  ligament.   DESCRIPTION OF PROCEDURE:  The patient was brought to the operating room and  placed in supine position upon the operating table.  Following placement of  a proximal forearm level IV regional block, the left arm was prepped with  Betadine soap and solution and sterilely draped.  When anesthesia was  satisfactory, the procedure commenced with a short incision in line  with the  ring finger in the palm.  Subcutaneous tissues are carefully divided  revealing the palmar fascia.  This was split longitudinally to reveal the  common sensory branch of the median nerve.  These were followed back to the  transverse carpal ligament which was carefully isolated from the median  nerve.  The ligament was released along its ulnar border extending to the  distal forearm.  This widely opened the carpal canal.  Bleeding points along  the margin of the released ligament were electrocauterized with bipolar  current followed by repair of the skin with intradermal 3-0 Prolene suture.  The synovium in the ulnar bursa was noted to be extremely dry and fibrotic.  There was severe compression of the nerve deep to the ligament.  No masses  or other predicaments were noted other than rather extreme varicosity of the  veins accompanying the superficial palmar arch.   Mr. Victorian was placed in a compressive dressing with a volar  plaster splint.                                               Katy Fitch Naaman Plummer., M.D.   RVS/MEDQ  D:  09/13/2003  T:  09/13/2003  Job:  [id number]

## 2010-12-07 NOTE — Discharge Summary (Signed)
[place]. [facility]  Patient:    [name], [name]                        MRN: [id number] Adm. Date:  [id number] Disc. Date: [id number] Attending:  Eleanora Neighbor Dictator:   Jennet Maduro. Earl Gala, R.N., A.N.P. CC:         Quita Skye. Artis Flock, M.D.   Discharge Summary  PRIMARY DISCHARGE DIAGNOSIS:  Chest pain with subsequent cardiac catheterization revealing 50-60% stenosis at the first diagonal.  SECONDARY DISCHARGE DIAGNOSIS:  Hiatal hernia with mild reflux noted on upper gastrointestinal series.  HISTORY OF PRESENT ILLNESS:  [name] is a very pleasant, 77 year old, white male who has basically been in very good health throughout his life, except for mild non-insulin-dependent diabetes.  He presented to the emergency department on August 26, 2000, with complaints of chest discomfort that had actually started the day before.  He was subsequently seen and admitted for further evaluation.  Please see the dictated history and physical for further patient presentation and profile.  LABORATORY DATA:  Cardiac enzymes were negative.  Troponin Is were negative.  The chest x-ray showed no acute abnormality.  The 12-lead electrocardiogram showed a normal sinus rhythm with no acute changes.  The hematocrit was stable at 37 and white count 7.7.  Glucose slightly elevated at 139.  Otherwise chemistries were unremarkable.  HOSPITAL COURSE:  The patient was admitted.  He ruled out negative for myocardial infarction with serial enzymes.  It was elected to proceed on with cardiac catheterization.  He was continuing to have mild chest discomfort actually in the emergency department.  He was subsequently referred for cardiac catheterization, which was tolerated well without any known complications.  The left ventricular function is normal.  The left main coronary is normal.  The LAD itself has irregularities.  There is a 50-60% stenosis at the first diagonal and this is  approximately a 2.5 mm vessel.  The left circumflex is normal.  The right coronary has some mild irregularities.  Overall he is felt to have primarily single-vessel disease with mild three-vessel disease with normal LV function and it is felt that he is best managed medically.  Post procedure, he was transferred to 5100 for further monitoring and evaluation.  His activity was increased and tolerated well.  It was still unclear as to if his actual chest discomfort was from a coronary etiology versus a GI etiology.  He was subsequently referred for gallbladder ultrasound, as well as an upper GI series.  This showed a hiatal hernia with mild reflux.  There is a rather complex cystic structure associated with a mid portion of the right lateral kidney measuring 4.8 x 5.7 cm.  A CT scan of the right kidney will be performed as an outpatient to further evaluate what appears to be a complex cyst.  Today on August 28, 2000, he is feeling well.  He has been up and ambulatory without problems.  He has had no recurrent chest pain.  He is stable on his current medical regimen.  The physical exam is unremarkable.  He is deemed stable for discharge today.  DISCHARGE CONDITION:  Stable.  DISCHARGE MEDICATIONS: 1. Protonix 40 mg a day. 2. Plavix 75 mg a day. 3. Baby aspirin each day. 4. Glucotrol 2.5 mg each day. 5. Lopressor 25 mg b.i.d. 6. Nitroglycerin p.r.n. for chest pain.  ACTIVITY:  He is not to engage in any strenuous activities until seen  back in follow-up.  DIET:  Low fat.  WOUND CARE:  He is to place a Band-Aid with Neosporin to the groin for the next one to two days.  SPECIAL INSTRUCTIONS:  He is given the okay to use laxatives if needed to clear the barium that he has previously had.  He is to call our office for any problems.  That number is made available to him.  FOLLOW-UP:  Will ask him to follow up with Colleen Can. Deborah Chalk, M.D., next Friday afternoon at 12:30 p.m. or  sooner if problems would arise. DD:  08/28/00 TD:  08/29/00 Job: [id number]

## 2010-12-07 NOTE — H&P (Signed)
[place]. [facility]  Patient:    [name], [name]                        MRN: [id number] Adm. Date:  [id number] Attending:  Cathren Laine Dictator:   Jennet Maduro Earl Gala, R.N., A.N.P. CC:         Dr. Penni Bombard   History and Physical  CHIEF COMPLAINT:  Chest pain.  HISTORY OF PRESENT ILLNESS:  [name] is a very pleasant 77 year old white male who really has no significant past medical history, except for mild non-insulin dependent diabetes over the last two to three years. He presents to the emergency room department earlier this morning with complaints of chest pain. He notes that he was previously in his usual state of health, without symptoms up until yesterday, midday, when he began experiencing some mild upper chest discomfort. There was no radiation of the discomfort. There was no associated nausea, vomiting, shortness of breath or diaphoresis. It somewhat waxed and waned, but persisted off and on until he went to bed last evening. He took no medications. At approximately 1:30 to 2 a.m. this morning he awoke with worsening discomfort still not associated with any other symptoms. He got up and took an extra aspirin, but basically sat up for the rest of the night. When his wife awoke this morning she brought him on to the emergency room where he was still complaining of mild chest discomfort. He was subsequently admitted for further evaluation.  PAST MEDICAL HISTORY: 1. Non-insulin dependent diabetes for the past two to three years. 2. History of appendectomy in the 1940s.  No reports of hypertension, hypercholesterolemia, prior stroke, cancer or other surgeries.  ALLERGIES:  No known drug allergies.  CURRENT MEDICATIONS: 1. Glucotrol 2.5 daily. 2. Baby aspirin daily.  FAMILY HISTORY:  Both of his parents are deceased at the age of 35 and died of natural causes.  SOCIAL HISTORY:  He is married. He is the owner of [employer]. There has been no  smoking for the past 20 years and prior to that he smoked at least one pack a day. He does engage in daily alcohol use and likes to drink one to two Bourbons a night with his evening meal.  REVIEW OF SYSTEMS:  Negative, except for as stated above. He has never had any prior chest discomfort. He has had no recent fever, flu, cough or chills. No abdominal pain, no constipation, no diarrhea, no edema, no light-headedness, no dizziness.  PHYSICAL EXAMINATION:  GENERAL:  He is a very pleasant white male who appears younger than his stated age.  VITAL SIGNS:  Blood pressure 140/60, heart rate in the mid 60s, respiratory rate 18, afebrile.  SKIN:  Warm and dry. Color is unremarkable.  NECK:  Supple.  LUNGS:  Show a few rales in the bases bilaterally.  CARDIAC:  Regular rate and rhythm. Somewhat distant heart tones.  ABDOMEN:  Obese. Soft and nontender.  EXTREMITIES:  No evidence of edema.  NEUROLOGIC:  Intact with no gross focal deficit.  LABORATORY DATA:  Chest x-ray is a portable film, shows no acute abnormality.  A 12 lead electrocardiogram showing normal sinus rhythm with no acute changes.  Other labs are currently pending. His first CK is negative. Troponin I is negative, as well.  OVERALL IMPRESSION: 1. Chest pain/unstable angina. 2. Non-insulin dependent diabetes.  PLAN:  Will go ahead and proceed on with admission to the hospital. Will place the patient  on IV nitroglycerin and heparin. Will more than likely need to proceed on with cardiac catheterization to further discern the etiology of his chest discomfort, especially with his cardiac risk factors, i.e., gender, age and known diabetes.DD:  08/26/00 TD:  08/26/00 Job: [id number]

## 2010-12-07 NOTE — Cardiovascular Report (Signed)
[place]. [facility]  Patient:    [name], [name]                        MRN: [id number] Proc. Date: 08/26/00 Adm. Date:  [id number] Attending:  Eleanora Neighbor CC:         J.D. Penni Bombard, M.D.   Cardiac Catheterization  PROCEDURE:  Left heart catheterization with selective coronary angiography, left ventricular angiography with Perclose.  TYPE AND SITE OF ENTRY:  Percutaneous right femoral artery.  CATHETERS:  The 6-French 4-curved Judkins right and left coronary catheterizations, 6-French pigtail ventriculographic catheter.  CONTRAST:  Omnipaque.  MEDICATIONS GIVEN PRIOR TO PROCEDURE:  Heparin, IV nitroglycerin, and aspirin.  MEDICATIONS GIVEN DURING PROCEDURE:  Versed 2 mg IV, Ancef 1 g IV.  COMMENTS:  Patient tolerated the procedure well.  HEMODYNAMIC DATA:  The aortic pressure was 106/53.  LV was 106/10.  There was no aortic valve gradient noted on pullback.  ANGIOGRAPHIC DATA: 1. The left main coronary artery is short and normal. 2. The left anterior descending has a large first diagonal vessel.  There is a    50-60% narrowing in the first diagonal vessel.  This is about a 2.5-mm    vessel at this point.  The left anterior descending artery has mild    irregularities as it courses toward the apex. 3. The left circumflex has irregularities; essentially normal otherwise. 4. The right coronary artery has irregularities but no greater than 30%    narrowing in scattered locations.  It is a large dominant vessel.  A left ventricular angiogram was performed in the RAO position.  Overall cardiac size and silhouette are normal.  Left ventricular function is normal. There is no mitral regurgitation, intracardiac calcification, or intracavitary filling defect.  OVERALL IMPRESSION: 1. Normal left ventricular function. 2. Primarily single-vessel disease with a 50-60% first diagonal vessel with    mild three-vessel disease (30% right coronary  artery, irregularities in the    left circumflex, and irregularities in the left anterior descending with    above described lesion in the first diagonal vessel).  We will proceed on with medical management.  ADDENDUM:  Perclose was used to close the artery successfully. DD:  08/26/00 TD:  08/27/00 Job: [id number]

## 2011-03-08 ENCOUNTER — Other Ambulatory Visit: Payer: Self-pay | Admitting: Family Medicine

## 2011-03-08 ENCOUNTER — Ambulatory Visit
Admission: RE | Admit: 2011-03-08 | Discharge: 2011-03-08 | Disposition: A | Discharge status: Home / Self Care | Payer: Medicare Other | Source: Ambulatory Visit | Attending: Family Medicine | Admitting: Family Medicine

## 2011-03-08 DIAGNOSIS — R0989 Other specified symptoms and signs involving the circulatory and respiratory systems: Secondary | ICD-10-CM

## 2011-03-22 ENCOUNTER — Telehealth: Payer: Self-pay | Admitting: Gastroenterology

## 2011-03-22 ENCOUNTER — Telehealth: Payer: Self-pay | Admitting: *Deleted

## 2011-03-22 NOTE — Telephone Encounter (Signed)
Dr Jarold Motto spoke to Dr Duaine Dredge.

## 2011-03-22 NOTE — Telephone Encounter (Signed)
Dr. Duaine Dredge.Marland KitchenMarland Kitchen

## 2011-03-22 NOTE — Telephone Encounter (Signed)
Sarah from short stay called back and we have schedule iron infusion for 03/26/2011 he needs to arrive at admitting at 8:15am for a 8:30am appt. I have faxed over a short stay order and medication list. I went over all instructions with patients daughter in law. She will let Judie Grieve know all info and call back if she has questions. She wanted to know after he has iron infusion does he needs follow up with Dr Duaine Dredge or Dr Jarold Motto or any follow up labs that [facility] can draw?

## 2011-03-22 NOTE — Telephone Encounter (Signed)
Per Dr Jarold Motto pt needs to have Iron infusion I have called Dr Polly Cobia office and his last HGB on 03/06/2011 was 9.2 and HCT was 27.2. Called short stay at [facility] and they will call back to set it up. I will notify patients son at [phone] his name is Judie Grieve and pt resides in [place].

## 2011-03-22 NOTE — Telephone Encounter (Signed)
Advised pts daughter in law

## 2011-03-26 ENCOUNTER — Encounter (HOSPITAL_COMMUNITY): Payer: Medicare Other | Attending: Gastroenterology

## 2011-03-26 DIAGNOSIS — D509 Iron deficiency anemia, unspecified: Secondary | ICD-10-CM | POA: Insufficient documentation

## 2011-04-02 ENCOUNTER — Telehealth: Payer: Self-pay | Admitting: Gastroenterology

## 2011-04-02 NOTE — Telephone Encounter (Signed)
Pt had IV Iron infusion on 03/26/11 in Short Stay and per notes Dr Jarold Motto would like for Dr Duaine Dredge to f/u infusion's efficacy . They have already done a CBC and the infusion didn't work d/t the levels so they would like it repeated. Explained it may take more than a month for H&H to rise- IS THIS CORRECT? Dr Duaine Dredge would like to know: 1) How long after an infusion does Dr Jarold Motto wait to check labs?                                                      2) What f/u labs does Dr Jarold Motto order?                                                      3) Does Dr Jarold Motto ever order a Rapid Iron infusion? Thanks.

## 2011-04-02 NOTE — Telephone Encounter (Signed)
Informed Christy at Dr [facility] ofc that he should wait > 1 month to recheck pt's labs- maybe mid October. Dr Jarold Motto stated he's not sure the pt is  Iron deficient, he ordered the infusion as a favor to Dr Duaine Dredge. Labs Dr Jarold Motto would repeat include: IBC, Ferritin, Serum B12, Folic Acid/Folate and a CBC. Dr Jarold Motto does not do rapid iron infusions. Neysa Bonito stated understanding.

## 2011-04-02 NOTE — Telephone Encounter (Signed)
I am not sure our deficiency is the whole problem. I would order repeat CBC iron levels and ferritin B12 and folate acid if not done. May need hematology referral. If he is having any GI issues he needs to followup with me in the office.

## 2011-04-17 ENCOUNTER — Encounter: Payer: Medicare Other | Admitting: Oncology

## 2011-05-14 ENCOUNTER — Encounter: Payer: Medicare Other | Admitting: Oncology

## 2011-05-26 ENCOUNTER — Other Ambulatory Visit: Payer: Self-pay | Admitting: Oncology

## 2011-05-26 DIAGNOSIS — D649 Anemia, unspecified: Secondary | ICD-10-CM

## 2011-05-28 ENCOUNTER — Ambulatory Visit (HOSPITAL_BASED_OUTPATIENT_CLINIC_OR_DEPARTMENT_OTHER): Payer: Medicare Other | Admitting: Oncology

## 2011-05-28 ENCOUNTER — Ambulatory Visit: Payer: Medicare Other

## 2011-05-28 ENCOUNTER — Other Ambulatory Visit: Payer: Self-pay | Admitting: Oncology

## 2011-05-28 ENCOUNTER — Telehealth: Payer: Self-pay | Admitting: Oncology

## 2011-05-28 ENCOUNTER — Other Ambulatory Visit (HOSPITAL_BASED_OUTPATIENT_CLINIC_OR_DEPARTMENT_OTHER): Payer: Medicare Other | Admitting: Lab

## 2011-05-28 DIAGNOSIS — D649 Anemia, unspecified: Secondary | ICD-10-CM

## 2011-05-28 DIAGNOSIS — D539 Nutritional anemia, unspecified: Secondary | ICD-10-CM

## 2011-05-28 LAB — CBC & DIFF AND RETIC
Eosinophils Absolute: 0.2 10*3/uL (ref 0.0–0.5)
HGB: 10.3 g/dL — ABNORMAL LOW (ref 13.0–17.1)
Immature Retic Fract: 6.1 % (ref 3.00–10.60)
MCV: 96 fL (ref 79.3–98.0)
MONO%: 9.6 % (ref 0.0–14.0)
NEUT#: 4.1 10*3/uL (ref 1.5–6.5)
RBC: 3.25 10*6/uL — ABNORMAL LOW (ref 4.20–5.82)
RDW: 14.7 % — ABNORMAL HIGH (ref 11.0–14.6)
Retic %: 1.63 % (ref 0.80–1.80)
Retic Ct Abs: 52.98 10*3/uL (ref 34.80–93.90)
WBC: 5.7 10*3/uL (ref 4.0–10.3)
lymph#: 0.9 10*3/uL (ref 0.9–3.3)

## 2011-05-28 LAB — MORPHOLOGY: PLT EST: ADEQUATE

## 2011-05-28 LAB — CHCC SMEAR

## 2011-05-28 NOTE — Progress Notes (Signed)
  This office note has been dictated. #[id number]

## 2011-05-28 NOTE — Telephone Encounter (Signed)
gve the pt's dtr the jan 2013 appt calendar °

## 2011-05-28 NOTE — Progress Notes (Signed)
CC:   [name], M.D.  HISTORY OF PRESENT ILLNESS:  [name] is an 77 year old white married male whom I am asked see in consultation by Dr. Mosetta Byrd for evaluation of anemia felt to be due to iron deficiency.  The patient is accompanied by his son, [name], [name].  [name]'s cell phone number is [phone].  Home number for [name] and [name] is [phone].  The patient is now living in assisted living.  He has some memory impairment or early dementia, although he is quite functional and capable of carrying out tasks of daily living.  The history is largely obtained from the patient's son and daughter-in-law.  He is not a reliable historian although he is quite interactive with a very nice sense of humor.  He does, however, tend to repeat himself.  According to the patient's son [name], [name] apparently developed anemia within the past year or so.  He has never had any blood transfusions.  The patient denies pagophagia.  He has had apparently some GI bleeding.  I am not sure he actually had positive stools, but on endoscopy that was carried out in April of this year by Dr. Jarold Byrd, he was noted to have an ulcer with some hemorrhage.  The patient also had a colonoscopy on April 2nd.  He was found to have polyps and some diverticulosis.  He had been treated with oral iron without response and then received iron dextran 1600 mg IV on March 26, 2011.  This order was placed by Dr. Jarold Byrd.  The patient apparently did not have an obvious response to this iron infusion.   We have laboratories from 04/25/2011.  At that time, white count was 6.0, hemoglobin 10.4, hematocrit 31.1, platelets 180,000.  Differential was normal.  We have studies from 04/04/2011.  Serum folate was 14.3, vitamin B12 level 319.  TSH 1.565.  On 03/28/2011, hemoglobin was 10.2, hematocrit 30.3.  It will be recalled that the iron infusion was given on 03/26/2011.  On 08/15, hemoglobin  was 9.2, hematocrit 27.7.  BUN was 30, creatinine 1.21.  On 02/28/2011, hemoglobin was 10.0, hematocrit 30.3.  Reticulocyte count was 1.4 which was not increased.  On 02/01/2011, ferritin was 282, with an iron saturation of 29%.  Iron and TIBC were in the normal range, 76 and 258, respectively.  On 01/29/2011, hemoglobin was 9.5, hematocrit 28.2.  On 12/27/2010, hemoglobin was 10.4, hematocrit 31.2.  On 12/13/2010, hemoglobin was 9.4, hematocrit 28.5.  On 11/02/2010, hemoglobin was 9.5, hematocrit 29.8.  On 10/10/2010, hemoglobin was 10.8, hematocrit 32.0.  On 10/02/2010, hemoglobin was 9.4, hematocrit 28.3.  On 08/13/2010, ferritin was 213.  Iron saturation was 11%, hemoglobin 10.7, hematocrit 31.7.  On 07/30/2010, ferritin was 303, iron saturation 20%.  On 07/30/2010, hemoglobin was 9.8, hematocrit 30.4, BUN 27, creatinine 1.23.  Once again, vitamin B12 level and serum folate were normal.  On careful questioning of the patient's family, the patient feels fine. He does not give any strong indications that his mild to moderate anemia has been deleterious.  The family does note, however, that the patient seems to be a little bit more feeble with decreasing memory.  He seems to be generally active although I note that he is on a walker. Apparently, he had some falls.  Apparently, he has been using a walker for the past year.  The patient has been on oral iron, one 3 times a day for at least the past 6 months, perhaps longer.  His mental functioning may have improved over the past few weeks since he was started on Aricept and Namenda.  He had been having sundowning, and that seems to be improved.  PAST MEDICAL HISTORY: 1. Notable for memory loss. 2. Some arthritis in his hands. 3. Diabetes mellitus type 2 for the past 10 years. 4. Hypertension for the past 10 years. 5. Dyslipidemia. 6. History of prostate cancer diagnosed in December 2011 treated by     Dr. Chipper Byrd  with radiation. 7. The patient had a seizure 4 or 5 years ago, also when he was much     younger. 8. He has had hearing loss with bilateral hearing aids. 9. He has atopic dermatitis. 10.Apparently, coronary artery disease.  PAST SURGICAL HISTORY:  His had a tonsillectomy as a child and an appendectomy many years ago.  He has had laser surgery on his left eye.  INJURIES:  No history of any injuries.  ALLERGIES:  No history of any allergies to any medicines.  MEDICATIONS:  Medicines are as follows: 1. Lipitor 10 mg daily. 2. Vitamin D3 1000 units daily. 3. Aricept 5 mg daily. 4. Ferrous sulfate 325 mg 3 times a day. 5. Lisinopril 10 mg tablets, a half a tablet equal to 5 mg daily. 6. Namenda 20 mg daily. 7. Metformin 500 mg with breakfast. 8. Toprol-XL 50 mg daily. 9. Omega-3 fatty acids 1000 mg daily. 10.Prilosec 20 mg twice daily. 11.Senokot 1 tab daily.  TRANSFUSIONS:  No history of blood transfusions.  FAMILY HISTORY:  No history of blood disorders.  His father apparently had abdominal cancer, died in his 52s.  A sister died of heart disease. Another sister is alive with dementia.  The patient has 2 sons.  [name] apparently is in good health.  His brother who is now 22 had lung cancer and colon cancer several years ago.  There are 2 grandchildren who are in good health.  SOCIAL HISTORY:  The patient smoked a pack cigarettes a day for 10 years when he was much younger.  He has not smoked in over 40 years.  He has 1 drink a day.  He is a lifelong native of [place].  He was in the [employer] for a while.  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  He retired a few years ago. He had been working until that time.  His wife has had several strokes and is in the skilled care area at [facility].  They have been married 64 years.  The patient is currently in assisted living since February of this year.  The patient  and his wife have been at [facility] for the past 7 years.  He last drove in 2010.  He used to play golf.  REVIEW OF SYSTEMS:  The patient is quite spry and alert but with obvious memory impairment.  He says he feels fine.  As stated, he uses a walker and did have some falls.  There is no history of stroke or any complaints of weakness.  The patient has had laser surgery.  He wears glasses.  He has decreased vision in his left eye.  He may have had a retinal bleed.  He sees Dr. Elmer Picker.  He has bilateral hearing loss with hearing aids.  No sinus problems or hay fever.  The patient's normal weight is around 180 pounds, and his weight today is 176 pounds.  Height is 5 feet,  7-1/2 inches.  He does have some constipation.  No obvious blood in the stool.  It is not clear whether he has ever had documented GI bleeding.  No history of liver problems, dyspeptic symptoms.  No cardiac or respiratory symptoms.  The patient denies any urinary symptoms despite his diagnosis of rectal cancer.  He has had swelling of the legs in the past.  No recent swelling.  No history of blood clots. Intermittent claudication.  No arthritis in the major joints, back or neck.  No fever or night sweats.  The patient sleeps well.  He apparently has dry skin with atopic dermatitis on the problem list.  He uses a topical cream.  The patient has had some depression.  Apparently, he has been treated recently with Prozac.  He has recently been started on Namenda and Aricept.  PHYSICAL EXAMINATION:  General:  [name] is a well-developed, well- nourished gentleman who has some difficulty with his gait and needs help getting up to the examining table but actually looks somewhat younger than his stated age.  Physically with sitting he does not look feeble at all.  Vital Signs:  Weight is 176 pounds, height 5 feet, 7-1/2 inches, blood pressure 128/70.  Other vital signs are normal.  HEENT:  There is no scleral icterus.  He  wears glasses.  Pupillary and extraocular movements are normal.  Mouth and pharynx are benign.  He has a partial upper plate.  Neck:  Without adenopathy, thyroid enlargement or bruit. Lungs:  Some bibasilar rales.  Cardiac:  Regular rhythm without murmur or rub.  Abdomen:  Benign with no organomegaly or masses palpable.  No axillary or inguinal adenopathy.  Extremities:  No peripheral edema, clubbing, palmar erythema, petechiae, or purpura.  The patient does have degenerative changes in his fingers.  Skin:  Dry and flaking. Neurologic:  Exam seemed to be grossly normal with excellent strength. The patient does seem a little unsteady on his feet, needs a walker and needed help getting up and down from the examining table.  Mental status:  He was quite animated, interactive but clearly has some memory impairment.  He tended to repeat himself.  LABORATORY DATA:  Today white count 5.7, ANC 4.1, hemoglobin 10.3, hematocrit 31.2, and platelets are 190,000.  Reticulocyte count was not elevated at 1.63 with absolute of 53.  Chemistries:  Inspection of the peripheral smear really did not disclose any remarkable findings.  The red cells were fairly round and did not suggest to me hypochromia or iron deficiency.  His MCV was 96.0.  MCH 31.7.  No myeloid or erythroid precursors.  No dyspoietic changes.  Platelets looked fairly normal. Chemistries were notable for a BUN of 32.  Creatinine was 1.3.  Glucose 112.  Sedimentation rate was 28.  Ferritin came back at 896, iron saturation 44%, iron 94, TIBC 212.  Pending are red cell folate, vitamin B12, immunofixation electrophoresis and quantitative immunoglobulins.  IMPRESSION AND PLAN:  The patient has a mild to moderate anemia which I do not think is clinically significant for him.  At age 54, he seems to be getting along fairly well.  His major problem at this time  seems to be his dementia.  As stated, he has been moved to assisted living.  He seems  to be functioning fairly well in that environment.  The etiology for this patient's anemia is not clear at this time.  I would say on the basis of the peripheral smear, history and iron  studies, that the patient is not iron deficient at this time.  It is not clear to me whether he has ever been iron deficient in the labs that I reviewed.  My recommendation to the family was to have the patient stop his iron at this time, pending review of his iron studies, which are available now, and therefore, I would have the family continue to hold on the ferrous sulfate.  I gave [name] Hemoccult cards.  Hopefully we can get these back.  It would be interesting to see if he does have any documented GI bleeding.  [name] may have anemia of renal insufficiency, possibly a refractory anemia associated with myelodysplastic syndrome.  His albumin and his liver function tests are normal.  If his labs ordered today come back normal, I would not be inclined to pursue any additional workup or specifically any treatment for his anemia.  I am doubtful that treatment with erythropoietin stimulating agents or even if we gave [name] some blood transfusions that it would result in any clinical benefit.  It seems that his hemoglobin and hematocrit have been stable over the past year running in the 9-10 range.  Today his hemoglobin was 10.3.  The patient and his family were reassured.  As stated, we gave him stool cards for occult blood, and the patient was told to stop his iron.  This was written on the orders presented to [place] from [facility].  I have asked [name] to return in 2 months at which time we will check CBC, chemistries, and repeat iron studies.  It was my pleasure to see [name] today in consultation.    ______________________________ Samul Dada, M.D. DSM/MEDQ  D:  05/28/2011  T:  05/28/2011  Job:  [id number]

## 2011-05-29 LAB — COMPREHENSIVE METABOLIC PANEL
ALT: 12 U/L (ref 0–53)
Albumin: 3.9 g/dL (ref 3.5–5.2)
CO2: 23 mEq/L (ref 19–32)
Calcium: 9 mg/dL (ref 8.4–10.5)
Chloride: 108 mEq/L (ref 96–112)
Creatinine, Ser: 1.3 mg/dL (ref 0.50–1.35)
Total Protein: 6.4 g/dL (ref 6.0–8.3)

## 2011-05-29 LAB — LACTATE DEHYDROGENASE: LDH: 159 U/L (ref 94–250)

## 2011-05-29 LAB — IRON AND TIBC: Iron: 94 ug/dL (ref 42–165)

## 2011-05-31 LAB — IMMUNOFIXATION ELECTROPHORESIS
IgA: 397 mg/dL — ABNORMAL HIGH (ref 68–379)
IgG (Immunoglobin G), Serum: 886 mg/dL (ref 650–1600)
Total Protein, Serum Electrophoresis: 6.3 g/dL (ref 6.0–8.3)

## 2011-05-31 LAB — FOLATE RBC: RBC Folate: 559 ng/mL (ref >=366)

## 2011-05-31 LAB — VITAMIN B12: Vitamin B-12: 291 pg/mL (ref 211–911)

## 2011-06-03 ENCOUNTER — Telehealth: Payer: Self-pay

## 2011-06-03 LAB — POC HEMOCCULT BLD/STL (HOME/3-CARD/SCREEN)

## 2011-06-03 NOTE — Telephone Encounter (Signed)
Aggie Cosier was asking if the order for hemocult stools could be resulted there or mailed to [facility]. She was told that hemocult could be done there and results faxed to [phone] - Dr Murinson's nurses' desk.

## 2011-06-04 ENCOUNTER — Telehealth: Payer: Self-pay

## 2011-06-04 NOTE — Telephone Encounter (Signed)
Judie Grieve called looking for lab results, I told him my [facility] was down so I could not look up old lab for comparisons.  He stated he will try again tomorrow

## 2011-06-07 ENCOUNTER — Telehealth: Payer: Self-pay

## 2011-06-07 NOTE — Telephone Encounter (Signed)
Reviewed labs w/son. Told him hemocult was positive x2. Pt is taking meds Dr Duaine Dredge prescibed after endoscopy/colonoscopy. Chart note and hemocult results placed on Dr Murinson's desk.

## 2011-06-07 NOTE — Telephone Encounter (Signed)
Son calling to review labs.

## 2011-06-17 ENCOUNTER — Encounter: Payer: Self-pay | Admitting: Medical Oncology

## 2011-06-17 NOTE — Progress Notes (Signed)
Hemoccult results from [facility] received and reviewed by Dr. Arline Asp. I faxed to Dr. Duaine Dredge and Dr. Jarold Motto per Dr. Mamie Levers request.

## 2011-06-17 NOTE — Progress Notes (Signed)
CHART NOTE:  This is a chart note only.  We got 3 stools sent to [place] from [facility] on 11/10, 11/11 and 06/03/2011.  The last 2 stools were positive, so we have 2 positive stools out of 3.  These results will be forwarded to Dr. Mosetta Putt and Dr. Sheryn Bison.  I had seen the patient for his initial visit on 07/27/2010.  In that new patient consultation note, I stated that Dr. Sheryn Bison had done endoscopy and colonoscopy on the patient in April 2012.  He had also noted some hemorrhage.  The patient does not appear to be iron deficient.  His hemoglobin on the day I saw him was 10.3, hematocrit 31.2.  The patient has some dementia and is being transferred to assisted living.  We are going to see him again in 2 months at which time we are going to check CBC and chemistries and repeat his iron studies.  I believe we had reviewed the patient's recent CBCs and hemoglobin dating back to July 2012.  It was fairly stable, running in the 9.5-10.8 range.    ______________________________ Samul Dada, M.D. DSM/MEDQ  D:  06/14/2011  T:  06/14/2011  Job:  [id number]

## 2011-07-05 IMAGING — CR DG SHOULDER 2+V*L*
4 series · 4 of 4 positions shown · non-contrast
Comparison: None

CLINICAL DATA: Pain

LEFT SHOULDER - 2+ VIEW

[view not recorded (1 of 4)]
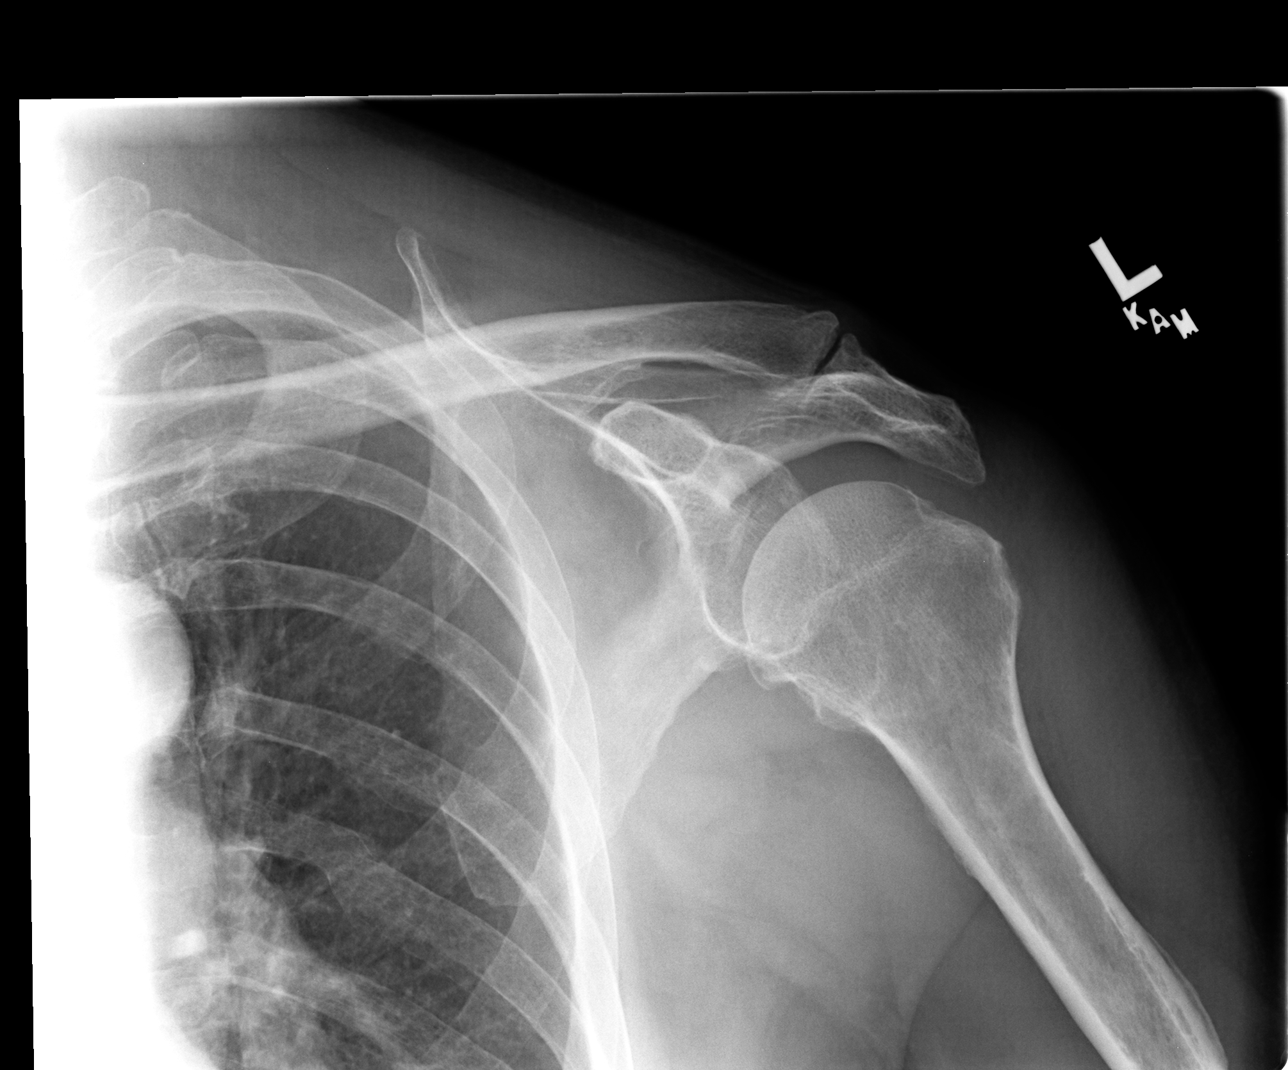

[view not recorded (2 of 4)]
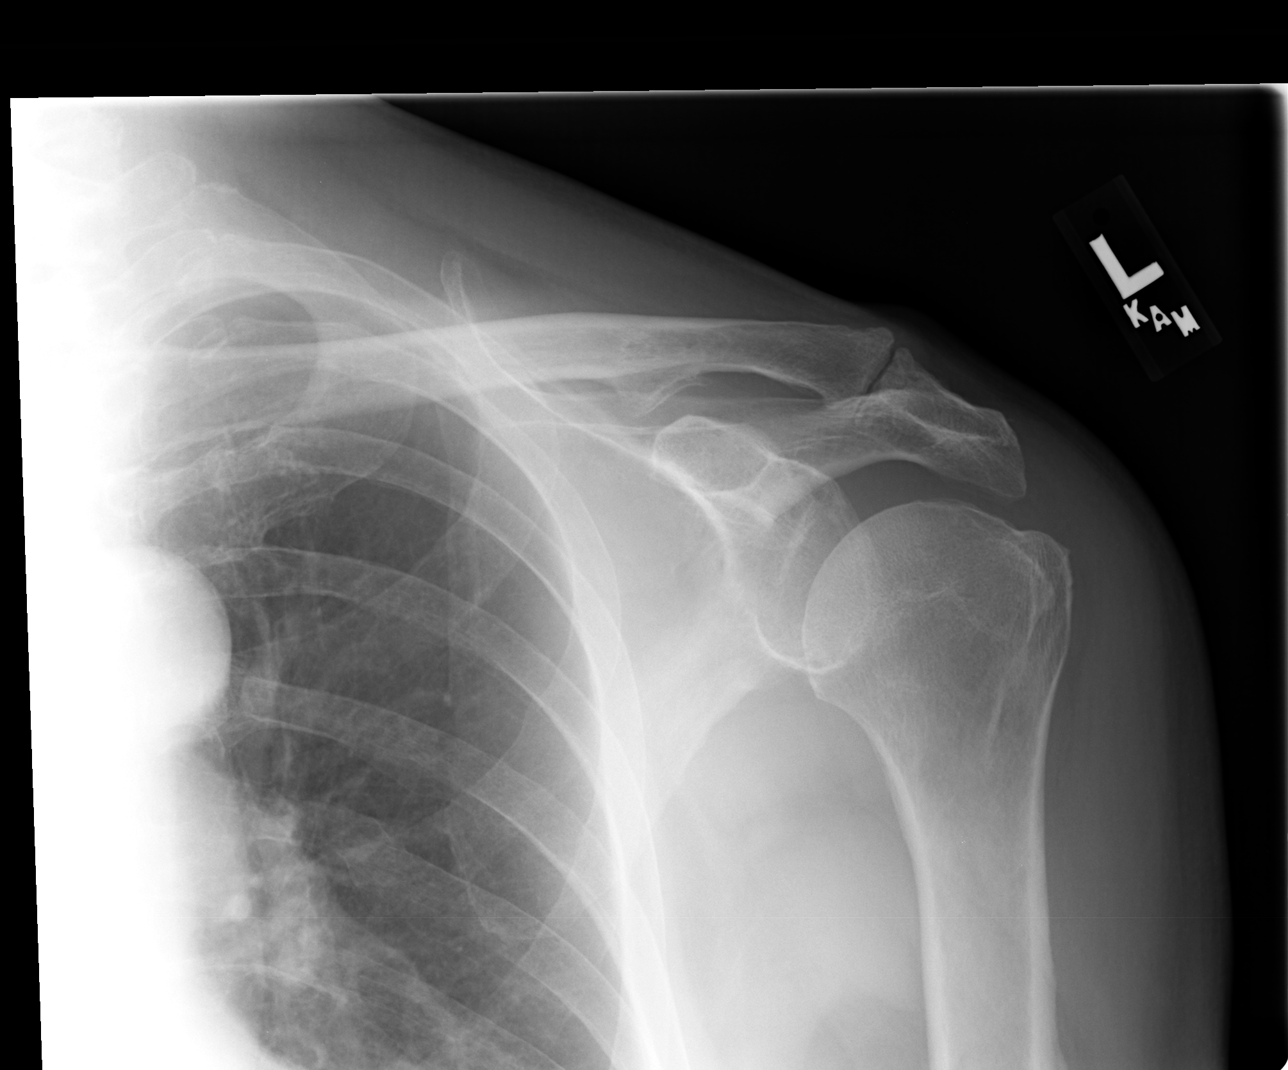

[view not recorded (3 of 4)]
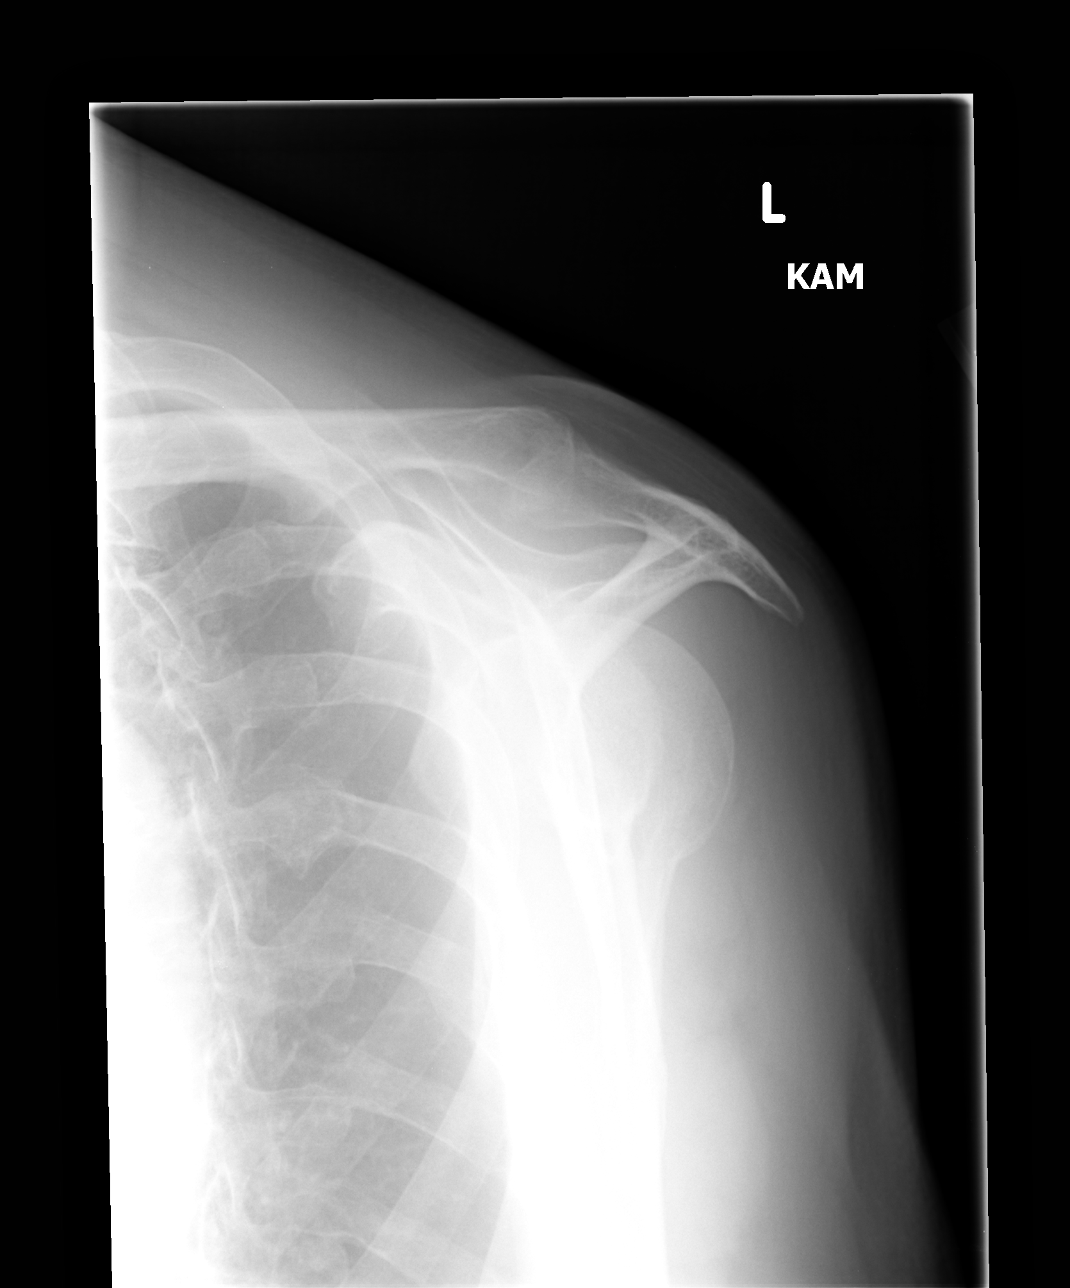

[view not recorded (4 of 4)]
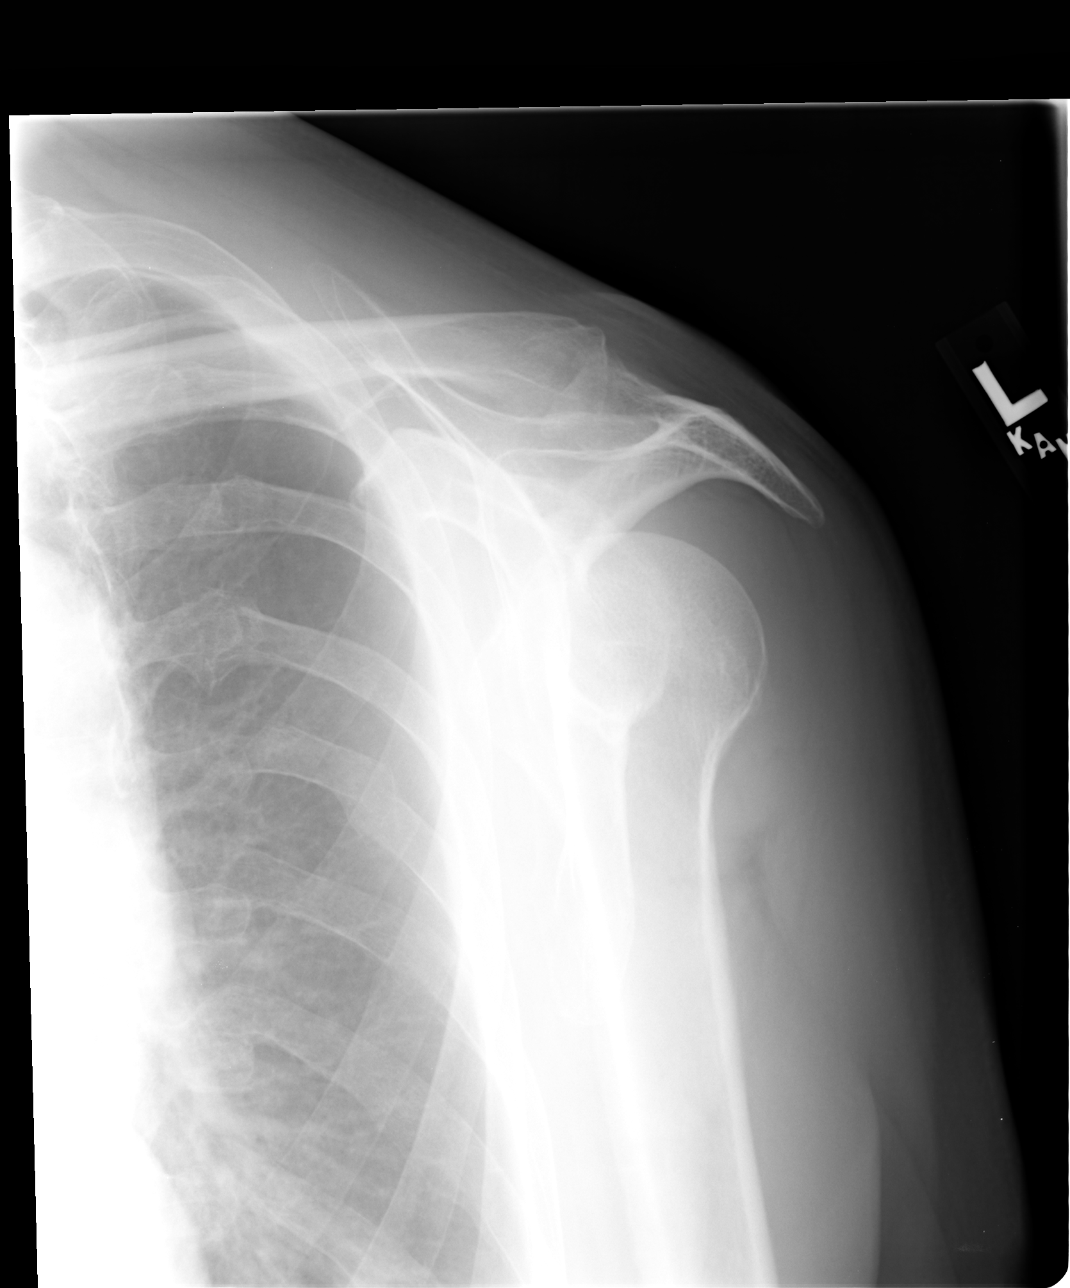

[4 of 4 positions shown; findings below may reference images not displayed]

FINDINGS: No acute fracture.  No dislocation.  Prominent inferior
osteophytes at the AC joint.  Unremarkable soft tissues.
IMPRESSION: No acute bony pathology.  Degenerative change.

## 2011-07-22 ENCOUNTER — Telehealth: Payer: Self-pay

## 2011-07-22 NOTE — Telephone Encounter (Signed)
We received fax of positive hemocult X2 from 06/02/11 and 06/03/11. [facility] had faxed this to [place] in Nov.  We had faxed to Dr Duaine Dredge and Dr Jarold Motto at that time per Dr Murinson's instruction. [facility] has gotten no response and were following up.  I told Arline Asp RN at [facility] to call Dr Geoffery Lyons office.

## 2011-07-23 DIAGNOSIS — Z9181 History of falling: Secondary | ICD-10-CM

## 2011-07-23 HISTORY — DX: History of falling: Z91.81

## 2011-07-24 ENCOUNTER — Encounter (HOSPITAL_COMMUNITY): Payer: Self-pay | Admitting: Oncology

## 2011-07-24 ENCOUNTER — Other Ambulatory Visit: Payer: Self-pay | Admitting: Oncology

## 2011-07-24 DIAGNOSIS — E538 Deficiency of other specified B group vitamins: Secondary | ICD-10-CM | POA: Insufficient documentation

## 2011-07-24 DIAGNOSIS — I10 Essential (primary) hypertension: Secondary | ICD-10-CM

## 2011-07-24 DIAGNOSIS — D649 Anemia, unspecified: Secondary | ICD-10-CM

## 2011-07-24 NOTE — Progress Notes (Unsigned)
Spoke with Dr. Ok Anis today.  Stools were positive several weeks ago.  Vit B12 level was slightly low.  Will plan to repeat Vit B12 level when I see patient again on 07/30/11.  We are not very enthusiastic about having pt undergo colonoscopy or a BM.  Pt does have some renal insufficiency.  He is falling and having impaired memory.  I do no think these problems are due to his mild anemia.  Could consider a trial of Aranesp if pt and family are anxious about his decline.  Will fax Dr. Duaine Dredge lab results as he has requested.

## 2011-07-30 ENCOUNTER — Encounter: Payer: Self-pay | Admitting: Oncology

## 2011-07-30 ENCOUNTER — Other Ambulatory Visit: Payer: Medicare Other | Admitting: Lab

## 2011-07-30 ENCOUNTER — Ambulatory Visit (HOSPITAL_BASED_OUTPATIENT_CLINIC_OR_DEPARTMENT_OTHER): Payer: Medicare Other | Admitting: Oncology

## 2011-07-30 VITALS — BP 120/63 | HR 56 | Temp 97.6°F | Ht 65.5 in | Wt 173.6 lb

## 2011-07-30 DIAGNOSIS — D539 Nutritional anemia, unspecified: Secondary | ICD-10-CM | POA: Diagnosis not present

## 2011-07-30 DIAGNOSIS — F0390 Unspecified dementia without behavioral disturbance: Secondary | ICD-10-CM

## 2011-07-30 DIAGNOSIS — D649 Anemia, unspecified: Secondary | ICD-10-CM | POA: Diagnosis not present

## 2011-07-30 DIAGNOSIS — E538 Deficiency of other specified B group vitamins: Secondary | ICD-10-CM | POA: Diagnosis not present

## 2011-07-30 LAB — IRON AND TIBC
%SAT: 37 % (ref 20–55)
Iron: 78 ug/dL (ref 42–165)

## 2011-07-30 LAB — CBC WITH DIFFERENTIAL/PLATELET
BASO%: 0.2 % (ref 0.0–2.0)
EOS%: 3.3 % (ref 0.0–7.0)
MCH: 33.5 pg — ABNORMAL HIGH (ref 27.2–33.4)
MCV: 98.3 fL — ABNORMAL HIGH (ref 79.3–98.0)
MONO%: 10.7 % (ref 0.0–14.0)
RBC: 2.98 10*6/uL — ABNORMAL LOW (ref 4.20–5.82)
RDW: 14.9 % — ABNORMAL HIGH (ref 11.0–14.6)

## 2011-07-30 LAB — COMPREHENSIVE METABOLIC PANEL
AST: 16 U/L (ref 0–37)
Albumin: 4 g/dL (ref 3.5–5.2)
Alkaline Phosphatase: 47 U/L (ref 39–117)
Potassium: 4.7 mEq/L (ref 3.5–5.3)
Sodium: 139 mEq/L (ref 135–145)
Total Protein: 6.6 g/dL (ref 6.0–8.3)

## 2011-07-30 LAB — VITAMIN B12: Vitamin B-12: 337 pg/mL (ref 211–911)

## 2011-07-30 NOTE — Progress Notes (Signed)
CC:   Mosetta Putt, M.D.  HISTORY:  I saw Zerita Boers today for followup of his anemia.  It will be recalled that Mr. Leighty was seen by me as a new patient on 05/28/2011.  He is accompanied today by his son, Arlys John, and Brian's wife, Misty Stanley.  It will be recalled that the patient resides at [facility] in assisted living.  His overall health has generally been quite good. His major problem seems to be some dementia.  When I first saw the patient, there were some concerns about the patient's declining state and whether the anemia could be contributing to that.  In my opinion, I do not think there is any connection between the patient's mild anemia and his clinical state.  Patient's son, Arlys John, was telling me that some adjustments have been made in the patient's medicines over the past couple of months and that his condition may have improved somewhat.  PROBLEM LIST:   1. Notable for memory loss/dementia.  2. Some arthritis in his hands.  3. Diabetes mellitus type 2 for the past 10 years.  4. Hypertension for the past 10 years.  5. Dyslipidemia.  6. History of prostate cancer diagnosed in December 2011 treated by  Dr. Chipper Herb with radiation.  7. The patient had a seizure 4 or 5 years ago, also when he was much  younger.  8. He has had hearing loss with bilateral hearing aids.  9. He has atopic dermatitis.  10.Apparently, coronary artery disease.  11.Anemia possibly due to renal insufficiency and generally stable during the past year 12.Renal insufficiency.  ALLERGIES: No history of any allergies to any medicines.   MEDICATIONS: Medicines are as follows:  1. Lipitor 10 mg daily.  2. Vitamin D3 1000 units daily.  3. Aricept 5 mg daily.  4. Ferrous sulfate 325 mg 3 times a day.  5. Lisinopril 10 mg tablets, a half a tablet equal to 5 mg daily.  6. Namenda 20 mg daily.  7. Metformin 500 mg with breakfast.  8. Toprol-XL 50 mg daily.  9. Omega-3 fatty acids 1000 mg daily.    10.Prilosec 20 mg twice daily.  11.Senokot 1 tab daily.  MEDICATIONS:  Reviewed and recorded.  We are going to discontinue ferrous sulfate.  I have recommended the patient start vitamin B12 1000 mcg p.o. daily.  PHYSICAL EXAM:  General:  Patient in general looks well.  He is in no acute distress.  He is pleasantly interactive but again tends to repeat himself.  Vital signs:  Weight is 173 pounds 9.6 ounces, height 5 feet 5- 1/2 inches, body surface area 1.91 meters squared.  Blood pressure 120/63, pulse 56 and regular, respirations regular and unlabored.  He is afebrile.  If there is no peripheral adenopathy palpable.  Heart and lungs:  Normal.  Abdomen:  With the patient sitting is benign. Extremities:  No peripheral edema.  LABORATORY DATA:  Today, white count 6.1, ANC 4.3, hemoglobin 10.0, hematocrit 29.3, platelets 164,000.  Chemistries, iron studies and vitamin B12 level are pending.  Lab data from 05/28/2011 notable for a hemoglobin of 10.3, hematocrit 31.2.  Inspection of the peripheral smear was benign.  There were no dyspoietic changes.  Chemistries were notable for BUN of 32, creatinine 1.30 suggesting some degree of renal insufficiency which would not be unexpected in this gentleman who was soon to be 78 years old and who has a history of diabetes mellitus as well as hypertension.  Serum immunofixation electrophoresis was negative.  IgG level was normal, IgA level was slightly elevated, 397, IgM slightly low at 29.  Ferritin was 896 with an iron saturation of 44%.  RBC folate was 559 and the vitamin B12 level was 291 with normal being 211-911.  Stools were checked at [facility] and 2/3 stools were positive.  Sedimentation rate was 28, reticulocyte count was not elevated.  IMPRESSION AND PLAN:  This patient has anemia which is nonspecific and I believe clinically benign for him at his advanced age and current level of functioning.  As stated in my note and as discussed  with Dr. Duaine Dredge by phone on January 2nd, I think the most likely explanation for this patient's anemia is mild renal insufficiency.  I do not think it is a clinically significant problem for him.  I do not think he requires further workup.  We had noted that he had undergone endoscopy and colonoscopy by Dr. Jarold Motto on 10/22/2010.  Apparently there was an ulcer noted with some hemorrhage.  It is my understanding that Dr. Jarold Motto is also not very enthusiastic about repeating the GI workup even with the patient's positive stools.  I do not think that Mr. Amble would benefit from a bone marrow aspirate and biopsy at this time.  I think the likelihood of finding anything significant on a bone marrow is unlikely.  I cannot entirely exclude the possibility of a refractory anemia associated with myelodysplastic syndrome.  Certainly if the patient were to be come more anemic and particularly if this had clinical consequences, then we could consider him for a trial of erythropoietin-stimulating agents.  All of this was explained to the family in detail.  I recommended stopping the iron.  We certainly could try giving him some vitamin B12 1000 mcg daily and follow his vitamin B12 levels and hemoglobin, hematocrit to see if there is any benefit from that.  I do not think there is any harm in giving him some extra vitamin B12.  I offered to continue to follow the patient.  I believe the family would like Dr. Duaine Dredge to follow the patient.  I think it is certainly reasonable to check CBCs every 3 or 4 months at this point.  Return appointment was not made for the patient.  I will be happy to see him again should any questions or concerns arise in the future.    ______________________________ Samul Dada, M.D. DSM/MEDQ  D:  07/30/2011  T:  07/30/2011  Job:  [id number]

## 2011-07-30 NOTE — Progress Notes (Signed)
This office note has been dictated.  #[id number]

## 2011-07-31 ENCOUNTER — Encounter: Payer: Self-pay | Admitting: Medical Oncology

## 2011-07-31 DIAGNOSIS — R489 Unspecified symbolic dysfunctions: Secondary | ICD-10-CM | POA: Diagnosis not present

## 2011-07-31 NOTE — Progress Notes (Signed)
Labs from 07/30/11 faxed to Dr. Mosetta Putt per Dr. Mamie Levers office.

## 2011-08-02 DIAGNOSIS — R489 Unspecified symbolic dysfunctions: Secondary | ICD-10-CM | POA: Diagnosis not present

## 2011-08-05 DIAGNOSIS — R489 Unspecified symbolic dysfunctions: Secondary | ICD-10-CM | POA: Diagnosis not present

## 2011-08-07 DIAGNOSIS — R489 Unspecified symbolic dysfunctions: Secondary | ICD-10-CM | POA: Diagnosis not present

## 2011-08-08 DIAGNOSIS — M25539 Pain in unspecified wrist: Secondary | ICD-10-CM | POA: Diagnosis not present

## 2011-08-08 DIAGNOSIS — M25549 Pain in joints of unspecified hand: Secondary | ICD-10-CM | POA: Diagnosis not present

## 2011-08-09 DIAGNOSIS — R489 Unspecified symbolic dysfunctions: Secondary | ICD-10-CM | POA: Diagnosis not present

## 2011-08-13 DIAGNOSIS — R489 Unspecified symbolic dysfunctions: Secondary | ICD-10-CM | POA: Diagnosis not present

## 2011-08-14 DIAGNOSIS — R489 Unspecified symbolic dysfunctions: Secondary | ICD-10-CM | POA: Diagnosis not present

## 2011-08-15 DIAGNOSIS — R489 Unspecified symbolic dysfunctions: Secondary | ICD-10-CM | POA: Diagnosis not present

## 2011-08-19 DIAGNOSIS — R489 Unspecified symbolic dysfunctions: Secondary | ICD-10-CM | POA: Diagnosis not present

## 2011-08-21 DIAGNOSIS — R489 Unspecified symbolic dysfunctions: Secondary | ICD-10-CM | POA: Diagnosis not present

## 2011-08-22 DIAGNOSIS — R489 Unspecified symbolic dysfunctions: Secondary | ICD-10-CM | POA: Diagnosis not present

## 2011-08-23 DIAGNOSIS — C61 Malignant neoplasm of prostate: Secondary | ICD-10-CM | POA: Diagnosis not present

## 2011-08-23 DIAGNOSIS — R489 Unspecified symbolic dysfunctions: Secondary | ICD-10-CM | POA: Diagnosis not present

## 2011-08-27 DIAGNOSIS — M25519 Pain in unspecified shoulder: Secondary | ICD-10-CM | POA: Diagnosis not present

## 2011-08-27 DIAGNOSIS — R489 Unspecified symbolic dysfunctions: Secondary | ICD-10-CM | POA: Diagnosis not present

## 2011-08-29 DIAGNOSIS — R293 Abnormal posture: Secondary | ICD-10-CM | POA: Diagnosis not present

## 2011-08-29 DIAGNOSIS — M25519 Pain in unspecified shoulder: Secondary | ICD-10-CM | POA: Diagnosis not present

## 2011-08-30 DIAGNOSIS — M25519 Pain in unspecified shoulder: Secondary | ICD-10-CM | POA: Diagnosis not present

## 2011-08-30 DIAGNOSIS — R293 Abnormal posture: Secondary | ICD-10-CM | POA: Diagnosis not present

## 2011-09-02 DIAGNOSIS — M25519 Pain in unspecified shoulder: Secondary | ICD-10-CM | POA: Diagnosis not present

## 2011-09-02 DIAGNOSIS — R293 Abnormal posture: Secondary | ICD-10-CM | POA: Diagnosis not present

## 2011-09-04 DIAGNOSIS — R293 Abnormal posture: Secondary | ICD-10-CM | POA: Diagnosis not present

## 2011-09-04 DIAGNOSIS — M25519 Pain in unspecified shoulder: Secondary | ICD-10-CM | POA: Diagnosis not present

## 2011-09-05 DIAGNOSIS — D509 Iron deficiency anemia, unspecified: Secondary | ICD-10-CM | POA: Diagnosis not present

## 2011-09-06 DIAGNOSIS — R293 Abnormal posture: Secondary | ICD-10-CM | POA: Diagnosis not present

## 2011-09-06 DIAGNOSIS — M25519 Pain in unspecified shoulder: Secondary | ICD-10-CM | POA: Diagnosis not present

## 2011-09-19 DIAGNOSIS — D509 Iron deficiency anemia, unspecified: Secondary | ICD-10-CM | POA: Diagnosis not present

## 2011-10-03 DIAGNOSIS — D509 Iron deficiency anemia, unspecified: Secondary | ICD-10-CM | POA: Diagnosis not present

## 2011-10-04 DIAGNOSIS — E119 Type 2 diabetes mellitus without complications: Secondary | ICD-10-CM | POA: Diagnosis not present

## 2011-10-07 DIAGNOSIS — H40019 Open angle with borderline findings, low risk, unspecified eye: Secondary | ICD-10-CM | POA: Diagnosis not present

## 2011-12-12 DIAGNOSIS — N390 Urinary tract infection, site not specified: Secondary | ICD-10-CM | POA: Diagnosis not present

## 2011-12-31 DIAGNOSIS — D509 Iron deficiency anemia, unspecified: Secondary | ICD-10-CM | POA: Diagnosis not present

## 2011-12-31 DIAGNOSIS — N390 Urinary tract infection, site not specified: Secondary | ICD-10-CM | POA: Diagnosis not present

## 2012-03-06 DIAGNOSIS — C61 Malignant neoplasm of prostate: Secondary | ICD-10-CM | POA: Diagnosis not present

## 2012-04-16 DIAGNOSIS — F0390 Unspecified dementia without behavioral disturbance: Secondary | ICD-10-CM | POA: Diagnosis not present

## 2012-04-28 DIAGNOSIS — Z23 Encounter for immunization: Secondary | ICD-10-CM | POA: Diagnosis not present

## 2012-05-11 DIAGNOSIS — M25539 Pain in unspecified wrist: Secondary | ICD-10-CM | POA: Diagnosis not present

## 2012-07-31 DIAGNOSIS — J069 Acute upper respiratory infection, unspecified: Secondary | ICD-10-CM | POA: Diagnosis not present

## 2012-07-31 DIAGNOSIS — E119 Type 2 diabetes mellitus without complications: Secondary | ICD-10-CM | POA: Diagnosis not present

## 2012-07-31 DIAGNOSIS — D638 Anemia in other chronic diseases classified elsewhere: Secondary | ICD-10-CM | POA: Diagnosis not present

## 2012-07-31 DIAGNOSIS — R509 Fever, unspecified: Secondary | ICD-10-CM | POA: Diagnosis not present

## 2012-07-31 DIAGNOSIS — F0281 Dementia in other diseases classified elsewhere with behavioral disturbance: Secondary | ICD-10-CM | POA: Diagnosis not present

## 2012-07-31 DIAGNOSIS — R5381 Other malaise: Secondary | ICD-10-CM | POA: Diagnosis not present

## 2012-07-31 DIAGNOSIS — R05 Cough: Secondary | ICD-10-CM | POA: Diagnosis not present

## 2012-07-31 DIAGNOSIS — R5383 Other fatigue: Secondary | ICD-10-CM | POA: Diagnosis not present

## 2012-08-03 DIAGNOSIS — F0281 Dementia in other diseases classified elsewhere with behavioral disturbance: Secondary | ICD-10-CM | POA: Diagnosis not present

## 2012-08-03 DIAGNOSIS — D638 Anemia in other chronic diseases classified elsewhere: Secondary | ICD-10-CM | POA: Diagnosis not present

## 2012-08-03 DIAGNOSIS — E538 Deficiency of other specified B group vitamins: Secondary | ICD-10-CM | POA: Diagnosis not present

## 2012-08-03 DIAGNOSIS — J069 Acute upper respiratory infection, unspecified: Secondary | ICD-10-CM | POA: Diagnosis not present

## 2012-08-27 DIAGNOSIS — D649 Anemia, unspecified: Secondary | ICD-10-CM | POA: Diagnosis not present

## 2012-09-24 DIAGNOSIS — F0390 Unspecified dementia without behavioral disturbance: Secondary | ICD-10-CM | POA: Diagnosis not present

## 2012-10-02 DIAGNOSIS — C61 Malignant neoplasm of prostate: Secondary | ICD-10-CM | POA: Diagnosis not present

## 2012-10-05 IMAGING — CT CT HEAD W/O CM
1 series · 10 of 12 positions shown, 13 images · non-contrast
Comparison: None

CT HEAD

CLINICAL DATA: Fall, neck pain,

CT HEAD WITHOUT CONTRAST
CT CERVICAL SPINE WITHOUT CONTRAST
TECHNIQUE: Multidetector CT imaging of the head and cervical spine
was performed following the standard protocol without intravenous
contrast.  Multiplanar CT image reconstructions of the cervical
spine were also generated.

[Series 12: head trauma 2.4 h60s · axial · 0.43mm/px · z∈[+1309,+1331]mm · 10 of 12 slices shown, 13 images]
[im 2/12  brain]
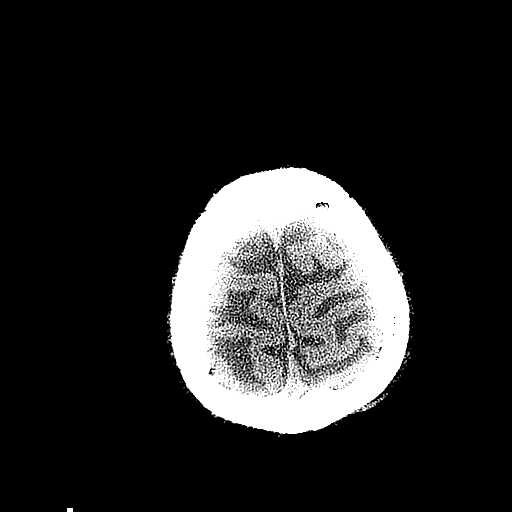
[im 2/12  bone]
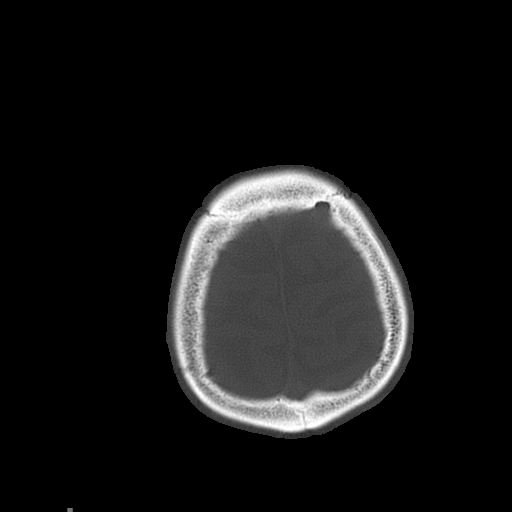
[im 3/12  brain]
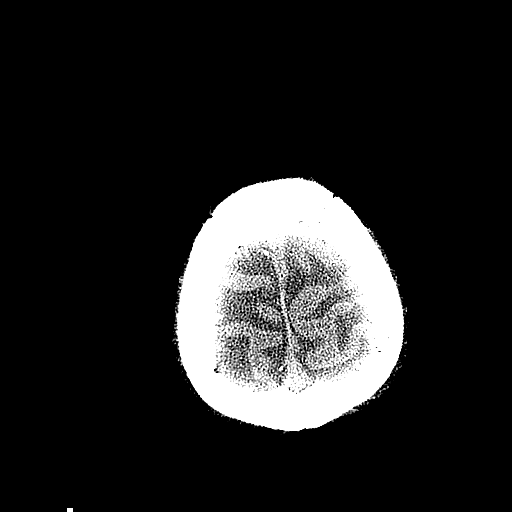
[im 4/12  brain]
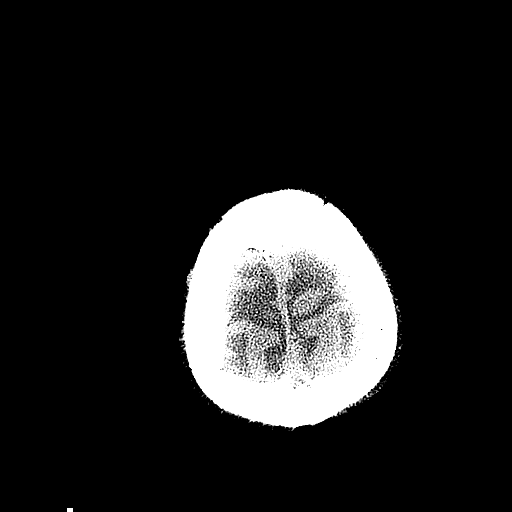
[im 5/12  brain]
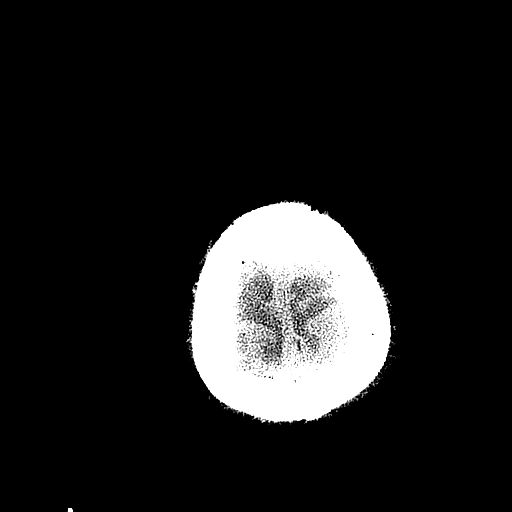
[im 6/12  brain]
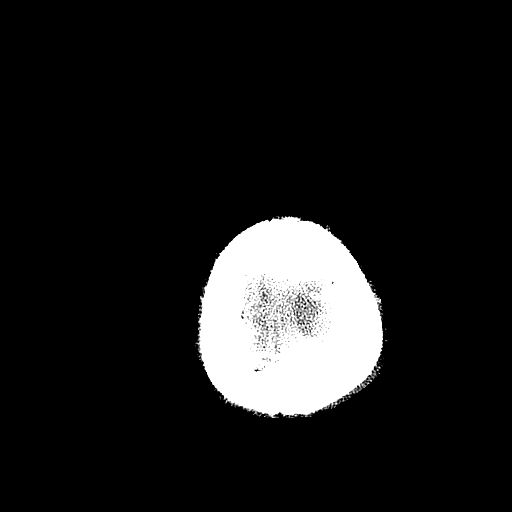
[im 6/12  bone]
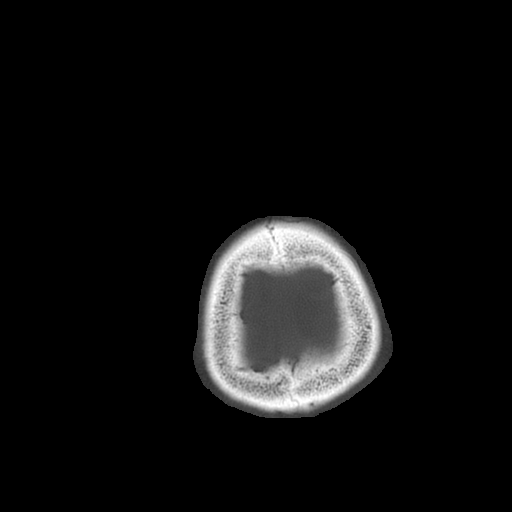
[im 7/12  brain]
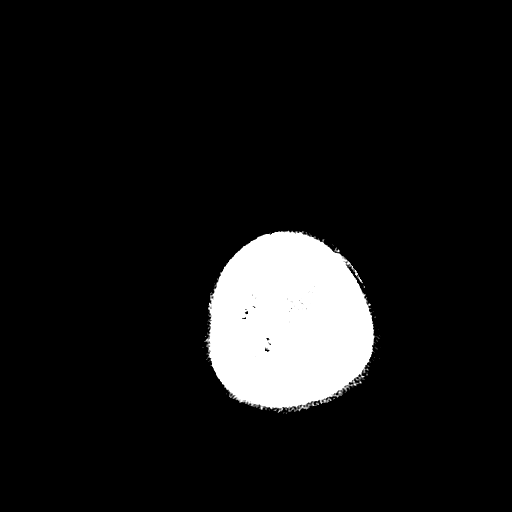
[im 8/12  brain]
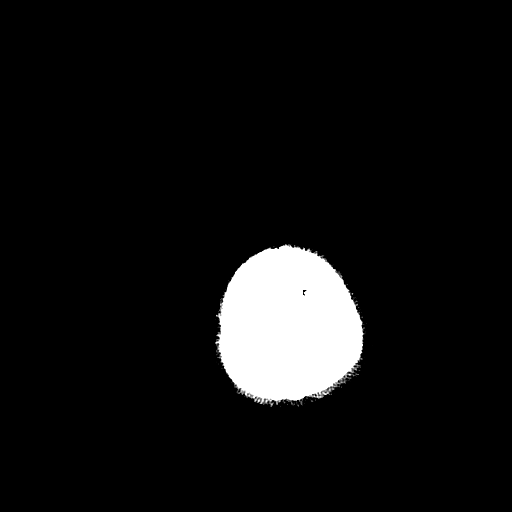
[im 9/12  brain]
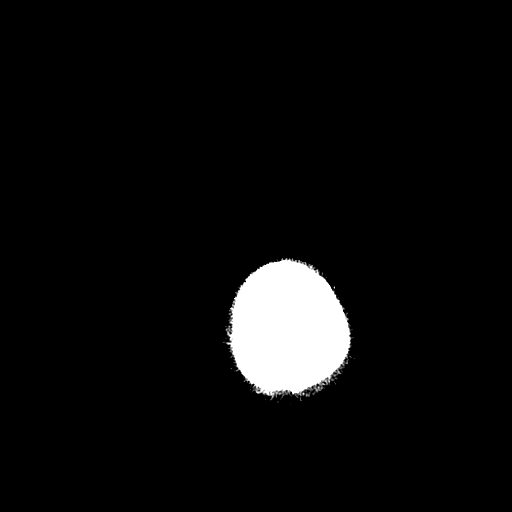
[im 10/12  brain]
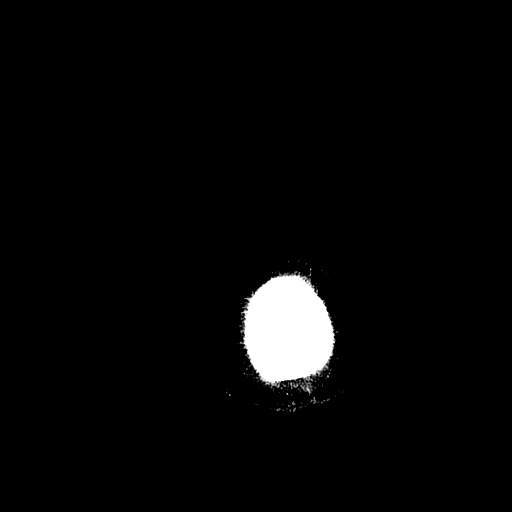
[im 10/12  bone]
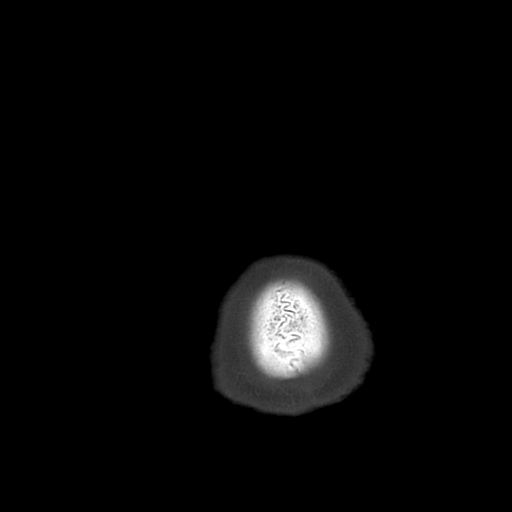
[im 11/12  brain]
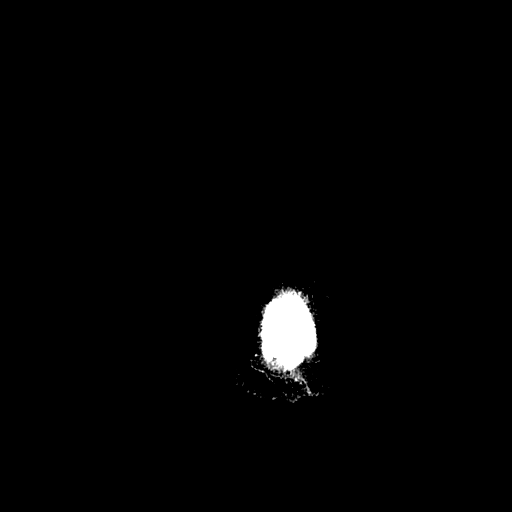

[10 of 12 positions shown; findings below may reference images not displayed]

FINDINGS: No intracranial hemorrhage.  No parenchymal contusion.

No focal mass lesion.  No CT evidence of acute infarction.  There
is generalized cortical atrophy and mild ventricular dilatation.

There is opacification of the right mastoid air cells.  Paranasal
sinuses are clear.  Orbits are normal.
IMPRESSION: 1.  No evidence of intracranial trauma.
2.  Generalized cortical atrophy.
3.  Unilateral right mastoiditis.

CT CERVICAL SPINE
FINDINGS: No prevertebral soft tissue swelling.  There is loss of
disc space and endplate osteophytosis at C3 through C6.  No acute
loss vertebral body height.  No evidence subluxation.  Normal facet
articulation.  There is multilevel facet hypertrophy and
uncovertebral hypertrophy.  Craniocervical junction is tacked.
There is calcification and small amount of pannus posterior to the
tip of the dens.

No evidence epidural paraspinal hematoma.
IMPRESSION: 1.  No evidence of cervical spine fracture.
2.  Multilevel disc osteophytic disease, facet hypertrophy,
uncovertebral hypertrophy.
3.  Calcification of the alar ligaments  and small amount of pannus
posterior to the dens.

## 2012-10-06 DIAGNOSIS — E119 Type 2 diabetes mellitus without complications: Secondary | ICD-10-CM | POA: Diagnosis not present

## 2012-10-06 DIAGNOSIS — E785 Hyperlipidemia, unspecified: Secondary | ICD-10-CM | POA: Diagnosis not present

## 2012-10-06 DIAGNOSIS — D649 Anemia, unspecified: Secondary | ICD-10-CM | POA: Diagnosis not present

## 2012-10-15 DIAGNOSIS — Z0000 Encounter for general adult medical examination without abnormal findings: Secondary | ICD-10-CM | POA: Diagnosis not present

## 2012-10-16 DIAGNOSIS — Z0000 Encounter for general adult medical examination without abnormal findings: Secondary | ICD-10-CM | POA: Diagnosis not present

## 2012-11-26 ENCOUNTER — Other Ambulatory Visit: Payer: Self-pay | Admitting: Dermatology

## 2012-11-26 DIAGNOSIS — D485 Neoplasm of uncertain behavior of skin: Secondary | ICD-10-CM | POA: Diagnosis not present

## 2012-11-26 DIAGNOSIS — Z85828 Personal history of other malignant neoplasm of skin: Secondary | ICD-10-CM | POA: Diagnosis not present

## 2012-11-26 DIAGNOSIS — L570 Actinic keratosis: Secondary | ICD-10-CM | POA: Diagnosis not present

## 2012-11-26 DIAGNOSIS — D0460 Carcinoma in situ of skin of unspecified upper limb, including shoulder: Secondary | ICD-10-CM | POA: Diagnosis not present

## 2012-11-26 DIAGNOSIS — Z85820 Personal history of malignant melanoma of skin: Secondary | ICD-10-CM | POA: Diagnosis not present

## 2012-12-09 DIAGNOSIS — H40019 Open angle with borderline findings, low risk, unspecified eye: Secondary | ICD-10-CM | POA: Diagnosis not present

## 2012-12-09 DIAGNOSIS — H2510 Age-related nuclear cataract, unspecified eye: Secondary | ICD-10-CM | POA: Diagnosis not present

## 2012-12-09 DIAGNOSIS — H4011X0 Primary open-angle glaucoma, stage unspecified: Secondary | ICD-10-CM | POA: Diagnosis not present

## 2012-12-09 DIAGNOSIS — H348192 Central retinal vein occlusion, unspecified eye, stable: Secondary | ICD-10-CM | POA: Diagnosis not present

## 2012-12-09 DIAGNOSIS — H409 Unspecified glaucoma: Secondary | ICD-10-CM | POA: Diagnosis not present

## 2012-12-09 DIAGNOSIS — H353190 Nonexudative age-related macular degeneration, unspecified eye, stage unspecified: Secondary | ICD-10-CM | POA: Diagnosis not present

## 2012-12-09 DIAGNOSIS — H02139 Senile ectropion of unspecified eye, unspecified eyelid: Secondary | ICD-10-CM | POA: Diagnosis not present

## 2012-12-09 DIAGNOSIS — E119 Type 2 diabetes mellitus without complications: Secondary | ICD-10-CM | POA: Diagnosis not present

## 2013-01-06 DIAGNOSIS — D649 Anemia, unspecified: Secondary | ICD-10-CM | POA: Diagnosis not present

## 2013-01-08 ENCOUNTER — Telehealth: Payer: Self-pay | Admitting: *Deleted

## 2013-01-08 ENCOUNTER — Other Ambulatory Visit: Payer: Self-pay

## 2013-01-08 ENCOUNTER — Other Ambulatory Visit: Payer: Self-pay | Admitting: Oncology

## 2013-01-08 NOTE — Telephone Encounter (Signed)
Received call from Dr.Blomgren stating that pt was seen by Dr Arline Asp before & wonders if it would be of benefit to see him again.  He reports that pt is symptomatic with a hgb of 8.5.  Looking for suggestions.  Cell # is [id number].  Note to Dr. Arline Asp.

## 2013-01-10 ENCOUNTER — Encounter: Payer: Self-pay | Admitting: Oncology

## 2013-01-10 NOTE — Progress Notes (Signed)
I received a call from Dr. Mosetta Putt on 01/08/2013. This patient has become lethargic and has experienced a decrease in his performance status. Recent hemoglobin was 8.5. Dr. Duaine Dredge and I discussed a trial of Aranesp.  We will forego a bone marrow in view of the patient's age and dementia. We will start with Aranesp 300 mcg subcutaneous every 2 weeks as a therapeutic trial to see if we can achieve a hematologic response that would be accompanied by a clinical response. If these objectives cannot be met after a satisfactory trial period, then Aranesp should not be continued. If however the patient does seem to benefit from Aranesp, and the plan would be to continue it.   The patient will need an appointment with baseline labs. We will probably need to obtain informed consent from one of his family members.

## 2013-01-11 ENCOUNTER — Telehealth: Payer: Self-pay | Admitting: Oncology

## 2013-01-12 ENCOUNTER — Other Ambulatory Visit: Payer: Self-pay | Admitting: Medical Oncology

## 2013-01-12 DIAGNOSIS — D649 Anemia, unspecified: Secondary | ICD-10-CM

## 2013-01-13 ENCOUNTER — Other Ambulatory Visit (HOSPITAL_BASED_OUTPATIENT_CLINIC_OR_DEPARTMENT_OTHER): Payer: Medicare Other

## 2013-01-13 ENCOUNTER — Ambulatory Visit (HOSPITAL_BASED_OUTPATIENT_CLINIC_OR_DEPARTMENT_OTHER): Payer: Medicare Other | Admitting: Physician Assistant

## 2013-01-13 ENCOUNTER — Ambulatory Visit: Payer: Medicare Other

## 2013-01-13 VITALS — BP 131/51 | HR 59 | Temp 97.3°F | Resp 18 | Ht 65.5 in | Wt 182.3 lb

## 2013-01-13 DIAGNOSIS — D649 Anemia, unspecified: Secondary | ICD-10-CM

## 2013-01-13 DIAGNOSIS — N289 Disorder of kidney and ureter, unspecified: Secondary | ICD-10-CM | POA: Diagnosis not present

## 2013-01-13 LAB — CBC WITH DIFFERENTIAL/PLATELET
Basophils Absolute: 0 10*3/uL (ref 0.0–0.1)
EOS%: 5.7 % (ref 0.0–7.0)
Eosinophils Absolute: 0.3 10*3/uL (ref 0.0–0.5)
HGB: 9.5 g/dL — ABNORMAL LOW (ref 13.0–17.1)
MCV: 94.6 fL (ref 79.3–98.0)
MONO%: 10.6 % (ref 0.0–14.0)
NEUT#: 4.1 10*3/uL (ref 1.5–6.5)
RBC: 2.95 10*6/uL — ABNORMAL LOW (ref 4.20–5.82)
RDW: 15.1 % — ABNORMAL HIGH (ref 11.0–14.6)
lymph#: 1 10*3/uL (ref 0.9–3.3)

## 2013-01-13 LAB — COMPREHENSIVE METABOLIC PANEL (CC13)
ALT: 8 U/L (ref 0–55)
Albumin: 3.3 g/dL — ABNORMAL LOW (ref 3.5–5.0)
Alkaline Phosphatase: 57 U/L (ref 40–150)
CO2: 23 mEq/L (ref 22–29)
Potassium: 4.9 mEq/L (ref 3.5–5.1)
Sodium: 141 mEq/L (ref 136–145)
Total Bilirubin: 0.4 mg/dL (ref 0.20–1.20)
Total Protein: 7.2 g/dL (ref 6.4–8.3)

## 2013-01-13 LAB — LACTATE DEHYDROGENASE (CC13): LDH: 167 U/L (ref 125–245)

## 2013-01-14 ENCOUNTER — Encounter: Payer: Self-pay | Admitting: Physician Assistant

## 2013-01-14 NOTE — Patient Instructions (Addendum)
Follow up as needed

## 2013-01-14 NOTE — Progress Notes (Signed)
CC:   Mosetta Putt, M.D.  HISTORY: [name] returns today for followup of his anemia. Dr. Arline Asp was contacted by Dr. Mosetta Putt regarding Mr. Bober. A recent hemoglobin by Dr. Duaine Dredge was 8.5 g/dL. There was also concerns about Mr. McBain becoming more lethargic with a decrease in his performance status. A trial of Aranesp was discussed and Mr. Welcher, his son Arlys John and Brian's wife Misty Stanley presented today to discuss this further.  It will be recalled that the patient resides at [facility] in assisted living.  His overall health has generally been quite good. Arlys John states that overall Mr. Jacklynn Lewis seems to be doing well in terms of his overall health and energy. There was a concern with the recent low hemoglobin of 8.5 and we have obtain repeat labs at our facility today. In addition to his mild anemia he also has mild renal insufficiency and repeat chemistries were drawn today as well.His major problem continues to be some dementia. There have been some medication changes however this specifics are unavailable at the time of this dictation. Either Arlys John or his wife Misty Stanley will have the assisted living facility faxed the current medication list to our office.  PROBLEM LIST:   1. Notable for memory loss/dementia.  2. Some arthritis in his hands.  3. Diabetes mellitus type 2 for the past 11 years.  4. Hypertension for the past 11 years.  5. Dyslipidemia.  6. History of prostate cancer diagnosed in December 2011 treated by  Dr. Chipper Herb with radiation.  7. The patient had a seizure 4 or 5 years ago, also when he was much  younger.  8. He has had hearing loss with bilateral hearing aids.  9. He has atopic dermatitis.  10.Apparently, coronary artery disease.  11.Anemia possibly due to renal insufficiency and generally stable during the past year 12.Renal insufficiency.  ALLERGIES: No history of any allergies to any medicines.   MEDICATIONS: Medicines are as follows:  1. Lipitor 10  mg daily.  2. Vitamin D3 1000 units daily.  3. Aricept 5 mg daily.  4. Ferrous sulfate 325 mg 3 times a day.  5. Lisinopril 10 mg tablets, a half a tablet equal to 5 mg daily.  6. Namenda 20 mg daily.  7. Metformin 500 mg with breakfast.  8. Toprol-XL 50 mg daily.  9. Omega-3 fatty acids 1000 mg daily.  10.Prilosec 20 mg twice daily.  11.Senokot 1 tab daily.  MEDICATIONS:  Reviewed and recorded.  As stated above the patient's family would insure that the most recent updated medication list is faxed to our office so that we may update our medication less.   PHYSICAL EXAM:  General:  Patient in general looks well.  He is in no acute distress.  He is pleasantly interactive but tends to repeat himself. Is able to get up upon the exam table with minimal assistance.  Vital signs:  Weight is 182 pounds 3 ounces, height 5 feet 5- 1/2 inches, body surface area 1.90 meters squared.  Blood pressure 131/51, pulse 59 and regular, O2 sat 90% on room air respirations regular and unlabored.  He is afebrile.  There is no peripheral adenopathy palpable.  Heart: Regular rate and rhythm, no appreciable murmurs gallops or rubs  lungs: Clear to auscultation without wheezes rales or rhonchi   Abdomen:  Soft nontender, no appreciable hepatosplenomegaly. Bowel sounds present normally active in all quadrants Extremities:  No peripheral edema.  LABORATORY DATA:  Review of his laboratory data here reveals  a white count of 6.1 with an ANC of 4.1 hemoglobin 9.5 hematocrit 27.9 platelets 189 chemistries revealed a sodium of 141, potassium 4.9 chloride 110 CO2 23 glucose 97 BUN 37.2 creatinine 1.6 total bilirubin 0.40 alkaline phosphatase 57 AST 14 ALT 8 total protein 7.2 albumin 3.3 calcium 9.6.after careful review of his laboratory data from our lab his anemia is relatively stable with a loss of only a half a gram of hemoglobin over the past year and a half from 10.0 in January 2013 29.5 today. Similarly his renal  insufficiency he is also quite stable with a creatinine a year and a half ago of 1.55 and a creatinine today 1.6. Today, white count 6.1, ANC 4.3, hemoglobin 10.0, hematocrit 29.3, platelets 164,000.  Chemistries, iron studies and vitamin B12 level are pending.  Lab data from 05/28/2011 notable for a hemoglobin of 10.3, hematocrit 31.2.  Inspection of the peripheral smear was benign.  There were no dyspoietic changes.  Chemistries were notable for BUN of 32, creatinine 1.30 suggesting some degree of renal insufficiency which would not be unexpected in this gentleman who was soon to be 79 years old and who has a history of diabetes mellitus as well as hypertension.  Serum immunofixation electrophoresis was negative.  IgG level was normal, IgA level was slightly elevated, 397, IgM slightly low at 29.  Ferritin was 896 with an iron saturation of 44%.  RBC folate was 559 and the vitamin B12 level was 291 with normal being 211-911.  Stools were checked at [facility] and 2/3 stools were positive.  Sedimentation rate was 28, reticulocyte count was not elevated.  IMPRESSION AND PLAN:  Mild anemia and mild renal insufficiency both of which are stable by our records. Patient was discussed with Dr. Arline Asp who also had a long discussion with the patient and his family. At this time, given that his anemia and renal insufficiency are both relatively stable it is felt that we will hold off on initiating a trial therapy with Aranesp. Mr. Rawlinson son Arlys John will request that his hemoglobin be checked at intermittent intervals to see if it is trending downward. As documented in Dr. Corrin Parker note of 1 8 2013 Mr. Vanroekel previously had some heme positive stools. He had undergone endoscopy and colonoscopy by Dr. Jarold Motto on 10/22/2010 and there had been an ulcer with some hemorrhage. We would recommend that Mr. McBain have his stool checked for occult blood as this may be the cause for the downward shift in his  hemoglobin. Should no evidence of bleeding be present and he still has a downward shift in his hemoglobin we will reconsider a trial of Aranesp for his anemia of chronic disease/renal insufficiency. Dr. Arline Asp does not think that Mr. Trapp would benefit from a bone marrow aspirate and biopsy at this time.  He  thinks the likelihood of finding anything significant on a bone marrow is unlikely.  The possibility of a refractory anemia associated with myelodysplastic syndrome cannot be ruled out.     It would be reasonable to check CBCs every 2 to 4 months at this point depending on his symptoms.  Return appointment was not made for the patient.  We will be happy to see him again should any questions or concerns arise in the future.    Laural Benes, Terissa Haffey E, PA-C

## 2013-01-18 ENCOUNTER — Encounter: Payer: Self-pay | Admitting: Medical Oncology

## 2013-02-24 ENCOUNTER — Encounter: Payer: Self-pay | Admitting: Internal Medicine

## 2013-04-16 DIAGNOSIS — C61 Malignant neoplasm of prostate: Secondary | ICD-10-CM | POA: Diagnosis not present

## 2013-07-28 DIAGNOSIS — L820 Inflamed seborrheic keratosis: Secondary | ICD-10-CM | POA: Diagnosis not present

## 2013-07-28 DIAGNOSIS — Z85828 Personal history of other malignant neoplasm of skin: Secondary | ICD-10-CM | POA: Diagnosis not present

## 2013-07-28 DIAGNOSIS — L259 Unspecified contact dermatitis, unspecified cause: Secondary | ICD-10-CM | POA: Diagnosis not present

## 2013-07-28 DIAGNOSIS — L821 Other seborrheic keratosis: Secondary | ICD-10-CM | POA: Diagnosis not present

## 2013-07-28 DIAGNOSIS — D485 Neoplasm of uncertain behavior of skin: Secondary | ICD-10-CM | POA: Diagnosis not present

## 2013-07-28 DIAGNOSIS — L570 Actinic keratosis: Secondary | ICD-10-CM | POA: Diagnosis not present

## 2013-07-28 DIAGNOSIS — R21 Rash and other nonspecific skin eruption: Secondary | ICD-10-CM | POA: Diagnosis not present

## 2013-09-06 DIAGNOSIS — H7290 Unspecified perforation of tympanic membrane, unspecified ear: Secondary | ICD-10-CM | POA: Diagnosis not present

## 2013-09-06 DIAGNOSIS — H9110 Presbycusis, unspecified ear: Secondary | ICD-10-CM | POA: Diagnosis not present

## 2013-09-06 DIAGNOSIS — H6120 Impacted cerumen, unspecified ear: Secondary | ICD-10-CM | POA: Diagnosis not present

## 2013-09-06 DIAGNOSIS — H60399 Other infective otitis externa, unspecified ear: Secondary | ICD-10-CM | POA: Diagnosis not present

## 2013-09-15 ENCOUNTER — Encounter: Payer: Self-pay | Admitting: Geriatric Medicine

## 2013-09-15 ENCOUNTER — Non-Acute Institutional Stay: Payer: Medicare Other | Admitting: Geriatric Medicine

## 2013-09-15 VITALS — BP 138/68 | HR 74 | Temp 96.9°F | Ht 65.5 in | Wt 195.0 lb

## 2013-09-15 DIAGNOSIS — H9190 Unspecified hearing loss, unspecified ear: Secondary | ICD-10-CM

## 2013-09-15 DIAGNOSIS — I10 Essential (primary) hypertension: Secondary | ICD-10-CM

## 2013-09-15 DIAGNOSIS — E538 Deficiency of other specified B group vitamins: Secondary | ICD-10-CM

## 2013-09-15 DIAGNOSIS — Z66 Do not resuscitate: Secondary | ICD-10-CM

## 2013-09-15 DIAGNOSIS — E1159 Type 2 diabetes mellitus with other circulatory complications: Secondary | ICD-10-CM

## 2013-09-15 DIAGNOSIS — R569 Unspecified convulsions: Secondary | ICD-10-CM

## 2013-09-15 DIAGNOSIS — Z8546 Personal history of malignant neoplasm of prostate: Secondary | ICD-10-CM

## 2013-09-15 DIAGNOSIS — F0280 Dementia in other diseases classified elsewhere without behavioral disturbance: Secondary | ICD-10-CM

## 2013-09-15 DIAGNOSIS — G309 Alzheimer's disease, unspecified: Secondary | ICD-10-CM

## 2013-09-15 DIAGNOSIS — D509 Iron deficiency anemia, unspecified: Secondary | ICD-10-CM

## 2013-09-15 NOTE — Progress Notes (Signed)
Patient ID: [name], male   DOB: 17-Feb-1922, 80 y.o.   MRN: [id number]   [facility] [phone])  Code Status: DNR, MOST form (03/2013)  Contact Information   Name Relation Home Work [place] Son [phone]  [phone]       Chief Complaint  Patient presents with  . Medical Managment of Chronic Issues    New Patient.  Switching to [facility] care.     HPI: This is a 80 y.o. male resident of [facility],  Assisted Living section evaluated today to establish care with [employer]. Change of Primary care services from Dr.Blomgren to [facility] was recommended as it is increasingly difficult for the patient to leave the facility for appointments. Patient's son Gaspar Bidding is present today and provides much of the patient's history and resent events. Facility record has been reviewed as well as recent progress notes from Dr. Sandi Mariscal. Patient's main issues are very poor hearing and dementia. Other problems include diabetes mellitus type 2, hypertension, anemia, eczema. He has remote history of seizures, last one probably 2009 or 2010, no medications. Also with a history of prostate cancer treated with RT, followed by Dr. Alinda Money, last visit 03/2013, PSA undetectable  Most recent lab studies are from June 2014. Facilty VS record reviewed, recent BP in low range, weight stable last few months. Patient evaluated at ENT 09/06/13 for hearing loss. Ear cerumen was removed, he was noted to have mild otitis externa.  Current functional status: Requires assist with bathing, dressing, grooming. Ambulates with walker. Requires medication management by nursing staff    Allergies  Allergen Reactions  . Aspirin   . Nsaids       Medication List       This list is accurate as of: 09/15/13 11:59 PM.  Always use your most recent med list.               atorvastatin 10 MG tablet  Commonly known as:  LIPITOR  Take 10 mg by mouth daily.     dextromethorphan 30 MG/5ML liquid  Commonly known as:  DELSYM  Take by mouth. 1 tsp every 12 hours as needed for cough     donepezil 5 MG tablet  Commonly known as:  ARICEPT  Take 5 mg by mouth at bedtime.     Fish Oil 1000 MG Caps  Take by mouth. Take one each morning     fluocinonide-emollient 0.05 % cream  Commonly known as:  LIDEX-E  Apply 1 application topically 2 (two) times daily.     ICAPS MV Tabs  Take by mouth.     latanoprost 0.005 % ophthalmic solution  Commonly known as:  XALATAN  Place 1 drop into the right eye at bedtime.     loratadine 10 MG tablet  Commonly known as:  CLARITIN  Take 10 mg by mouth daily as needed for allergies.     metFORMIN 500 MG 24 hr tablet  Commonly known as:  GLUCOPHAGE-XR  Take 1,000 mg by mouth 2 (two) times daily.     metoprolol succinate 50 MG 24 hr tablet  Commonly known as:  TOPROL-XL  Take 50 mg by mouth daily.     NAMENDA XR 21 MG Cp24  Generic drug:  Memantine HCl ER  Take by mouth. Take one daily     omeprazole 20 MG capsule  Commonly known as:  PRILOSEC  Take 20 mg by mouth 2 (two) times daily.  PARoxetine 20 MG tablet  Commonly known as:  PAXIL  Take 20 mg by mouth every morning.     polyvinyl alcohol 1.4 % ophthalmic solution  Commonly known as:  LIQUIFILM TEARS  Place 1 drop into both eyes. One drop four times daily as needed     senna 8.6 MG tablet  Commonly known as:  SENOKOT  Take 1 tablet by mouth daily.     triamcinolone cream 0.1 %  Commonly known as:  KENALOG  Apply topically 2 (two) times daily.     VITAMIN B 12 PO  Take 1,000 mcg by mouth daily.     Vitamin D3 1000 UNITS Caps  Take by mouth.        DATA REVIEWED  Radiologic Exams:   Cardiovascular Exams:   Laboratory Studies: Lab Results  Component Value Date   WBC 6.1 01/13/2013   HGB 9.5* 01/13/2013   HCT 27.9* 01/13/2013   MCV 94.6 01/13/2013   PLT 189 01/13/2013     Component Value Date   NA 141 01/13/2013   K 4.9  01/13/2013   CL 110* 01/13/2013   CO2 23 01/13/2013   GLUCOSE 97 01/13/2013   BUN 37.2* 01/13/2013   CREATININE 1.6* 01/13/2013   CALCIUM 9.6 01/13/2013   ALBUMIN 3.3* 01/13/2013   AST 14 01/13/2013   ALT 8 01/13/2013   ALKPHOS 57 01/13/2013   BILITOT 0.40 01/13/2013   Lab Results  Component Value Date   VITAMINB12 337 07/30/2011        Past Medical History  Diagnosis Date  . Anemia 2012  . Arthritis   . Diabetes mellitus 1997  . Hyperlipidemia   . Hypertension 2008  . Cancer     prostate  . Dementia in conditions classified elsewhere with behavioral disturbance   . Actinic keratosis   . Macular degeneration (senile) of retina, unspecified   . Epilepsy 1943  . Unspecified venous (peripheral) insufficiency   . Glaucoma   . Malignant neoplasm of prostate 2011    treated with radiation  . Senile dementia, uncomplicated   . Major depressive disorder, single episode, unspecified   . Other B-complex deficiencies 2012  . Other specified forms of hearing loss   . Unspecified vitamin D deficiency 2010  . Reflux esophagitis   . Generalized osteoarthrosis, unspecified site   . Personal history of colonic polyps   . Coronary atherosclerosis of native coronary artery    Past Surgical History  Procedure Laterality Date  . Appendectomy  1947  . Colonoscopy  10/22/10    multiple adenomatous polyps  . Tonsillectomy  1930  . Carpal tunnel release Bilateral 2005  . Myringotomy Right    Family Status  Relation Status Death Age  . Son Alive   . Mother Deceased 80    dementia, heart disease  . Father Deceased 46  . Sister Deceased   . Sister Deceased   . Son Deceased 48    mental illness   History   Social History Narrative   Patient is Married, Physicist, medical.  Retired Human resources officer. Lives in  [place] section at [place] since 2004, spouse lives in [place] unit in same community.   Quit Smoking 1983, no alcohol history   Patient has  Advanced  planning documents: Living Will, DNR, MOST form           REVIEW OF SYSTEMS  DATA OBTAINED: from patient, nurse, medical record, family member GENERAL: Feels well  No recent fever, fatigue, change in appetite or weight SKIN: No itch,  open wounds. Has recurrent scaly patches (eczema) EYES: No eye pain, dryness or itching  No change in vision EARS: No earache, tinnitus, change in hearing. Hearing is very poor even with aides.  NOSE: No congestion, drainage or bleeding MOUTH/THROAT: No mouth or tooth pain  No sore throat   No difficulty chewing or swallowing RESPIRATORY: No cough, wheezing, SOB CARDIAC: No chest pain, palpitations  No edema. GI: No abdominal pain  No nausea, vomiting,diarrhea or constipation (daily laxative)  No heartburn or reflux  GU: No dysuria, frequency or urgency  No change in urine volume or character  No nocturia or change in stream  Occ. incontinence MUSCULOSKELETAL: No joint pain, swelling or stiffness  No back pain  No muscle ache, pain, weakness  Gait is unsteady  No recent falls.  NEUROLOGIC: No dizziness, fainting, headache, numbness  No change in mental status (dementia).  PSYCHIATRIC: No feelings of anxiety, depression  Sleeps well.  No recent behavior issue.    PHYSICAL EXAM Filed Vitals:   09/15/13 1027  BP: 138/68  Pulse: 74  Temp: 96.9 F (36.1 C)  TempSrc: Oral  Height: 5' 5.5" (1.664 m)  Weight: 195 lb (88.451 kg)  SpO2: 92%   Body mass index is 31.94 kg/(m^2).  GENERAL APPEARANCE: No acute distress, appropriately groomed, Overweight body habitus. Alert, pleasant, conversant, jovial SKIN: No diaphoresis, unusual lesions, wounds. Few scattered patches of red, dry skin UE/LE HEAD: Normocephalic, atraumatic EYES: Conjunctiva/lids clear. Pupils round, reactive. EOMs intact.  EARS:  Hearing is diminished, bilateral aides in place. NOSE: No deformity or discharge. MOUTH/THROAT: Lips w/o lesions. Oral mucosa, tongue moist, w/o lesion.  Oropharynx w/o redness or lesions.  NECK: Supple, full ROM. No thyroid tenderness, enlargement or nodule LYMPHATICS: No head, neck or supraclavicular adenopathy RESPIRATORY: Breathing is even, unlabored. Lung sounds are clear and full.  CHEST/BREASTS: No chest deformity.  CARDIOVASCULAR: Heart RRR. No murmur or extra heart sounds  ARTERIAL: No carotid bruit. Carotid pulse 2+, Femoral absent, Popliteal 1+, Absent DP,PT  VENOUS: No varicosities. No venous stasis skin changes  EDEMA: No peripheral edema.  GASTROINTESTINAL: Abdomen is obese, soft, non-tender, not distended w/ normal bowel sounds. No hepatic or splenic enlargement. No mass, ventral or inguinal hernia. MUSCULOSKELETAL: Moves all extremities with full ROM, strength and tone. Back with kyphosis, No scoliosis or spinal process tenderness. Gait is unsteady, better with walker NEUROLOGIC: Not oriented to time, place, Oriented to person. Cranial nerves 2-12 grossly intact, speech clear, no tremor. Patella, brachial DTR diminished PSYCHIATRIC: Mood and affect appropriate to situation   ASSESSMENT/PLAN  HYPERTENSION Recnret BP range 100-105/61-65. D/C lisinopril. Update lab  Vitamin B12 deficiency Continues supplement, update lab  Type II or unspecified type diabetes mellitus with peripheral circulatory disorders, not stated as uncontrolled(250.70) No recent CBG or A1c available today. Most recent eGFR 28. Continue metformin for now, update lab  Alzheimer disease Most recent MMSE score of 21/30 along with functional deficits in ADLs, including occasional incontinence are consistent with probable Alzheimer type dementia. No significant behavior issues recently. Continue donepezil and memantine. He remains appropriate for AL setting  HEARING LOSS Significant hearing loss persists despite hearing aides . This contributes to cognitive difficulties. He benfits from supportive environment of AL.  ANEMIA-IRON DEFICIENCY Evaluated by  Dr.Murinson 2014. Was taking iron and B12. Iron supplement not on current MAR. Update lab   H/O prostate cancer Followed by Dr.Borden, last visit 03/2013. PSA undetectable.  Family/ staff Communication:  Son present for interview/ exam. Agrees with plan for lab studies. Explained timing of routine visits with Dr.Green and myself. [place] office number for any questions or concerns   Goals of care: Maintain current quality of life, avoid aggressive interventions/ or hospitalization if possible  Labs/tests ordered: CBC,CMP, lipid panel, A1C, TSH, Vit D, Vit B12  Time: 60 minutes, >50% spent counseling/or care coordination   Follow up: Return in about 12 days (around 09/27/2013) for F/U new pt w/Dr.Green.  [name], NP-C [place] [phone]  09/15/2013

## 2013-09-16 ENCOUNTER — Encounter: Payer: Self-pay | Admitting: Geriatric Medicine

## 2013-09-16 DIAGNOSIS — Z8546 Personal history of malignant neoplasm of prostate: Secondary | ICD-10-CM | POA: Insufficient documentation

## 2013-09-16 NOTE — Assessment & Plan Note (Signed)
Evaluated by Dr.Murinson 2014. Was taking iron and B12. Iron supplement not on current MAR. Update lab

## 2013-09-16 NOTE — Assessment & Plan Note (Addendum)
Most recent MMSE score of 21/30 along with functional deficits in ADLs, including occasional incontinence are consistent with probable Alzheimer type dementia. No significant behavior issues recently. Continue donepezil and memantine. He remains appropriate for AL setting

## 2013-09-16 NOTE — Assessment & Plan Note (Signed)
Continues supplement, update lab

## 2013-09-16 NOTE — Assessment & Plan Note (Signed)
No recent CBG or A1c available today. Most recent eGFR 28. Continue metformin for now, update lab

## 2013-09-16 NOTE — Assessment & Plan Note (Signed)
Followed by Dr.Borden, last visit 03/2013. PSA undetectable.

## 2013-09-16 NOTE — Assessment & Plan Note (Signed)
Recnret BP range 100-105/61-65. D/C lisinopril. Update lab

## 2013-09-16 NOTE — Assessment & Plan Note (Signed)
Significant hearing loss persists despite hearing aides . This contributes to cognitive difficulties. He benfits from supportive environment of AL.

## 2013-09-17 DIAGNOSIS — I10 Essential (primary) hypertension: Secondary | ICD-10-CM | POA: Diagnosis not present

## 2013-09-17 DIAGNOSIS — E538 Deficiency of other specified B group vitamins: Secondary | ICD-10-CM | POA: Diagnosis not present

## 2013-09-17 DIAGNOSIS — E559 Vitamin D deficiency, unspecified: Secondary | ICD-10-CM | POA: Diagnosis not present

## 2013-09-17 DIAGNOSIS — D509 Iron deficiency anemia, unspecified: Secondary | ICD-10-CM | POA: Diagnosis not present

## 2013-09-17 DIAGNOSIS — F068 Other specified mental disorders due to known physiological condition: Secondary | ICD-10-CM | POA: Diagnosis not present

## 2013-09-17 DIAGNOSIS — E119 Type 2 diabetes mellitus without complications: Secondary | ICD-10-CM | POA: Diagnosis not present

## 2013-09-17 DIAGNOSIS — E785 Hyperlipidemia, unspecified: Secondary | ICD-10-CM | POA: Diagnosis not present

## 2013-09-17 LAB — LIPID PANEL
Cholesterol: 150 mg/dL (ref 0–200)
HDL: 57 mg/dL (ref 35–70)
LDL CALC: 72 mg/dL
Triglycerides: 107 mg/dL (ref 40–160)

## 2013-09-17 LAB — HEPATIC FUNCTION PANEL
ALK PHOS: 48 U/L (ref 25–125)
ALT: 9 U/L — AB (ref 10–40)
AST: 13 U/L — AB (ref 14–40)
Bilirubin, Total: 0.5 mg/dL

## 2013-09-17 LAB — CBC AND DIFFERENTIAL
HCT: 25 % — AB (ref 41–53)
HEMOGLOBIN: 8.5 g/dL — AB (ref 13.5–17.5)
PLATELETS: 196 10*3/uL (ref 150–399)
WBC: 6.3 10*3/mL

## 2013-09-17 LAB — BASIC METABOLIC PANEL
BUN: 35 mg/dL — AB (ref 4–21)
Creatinine: 1.6 mg/dL — AB (ref 0.6–1.3)
GLUCOSE: 99 mg/dL
Potassium: 4.9 mmol/L (ref 3.4–5.3)
Sodium: 138 mmol/L (ref 137–147)

## 2013-09-17 LAB — HEMOGLOBIN A1C: Hgb A1c MFr Bld: 6.4 % — AB (ref 4.0–6.0)

## 2013-09-17 LAB — TSH: TSH: 2.51 u[IU]/mL (ref 0.41–5.90)

## 2013-09-27 ENCOUNTER — Non-Acute Institutional Stay: Payer: Medicare Other | Admitting: Internal Medicine

## 2013-09-27 ENCOUNTER — Encounter: Payer: Self-pay | Admitting: Internal Medicine

## 2013-09-27 VITALS — BP 104/62 | HR 68 | Ht 65.5 in | Wt 193.0 lb

## 2013-09-27 DIAGNOSIS — E785 Hyperlipidemia, unspecified: Secondary | ICD-10-CM | POA: Insufficient documentation

## 2013-09-27 DIAGNOSIS — R569 Unspecified convulsions: Secondary | ICD-10-CM

## 2013-09-27 DIAGNOSIS — E1159 Type 2 diabetes mellitus with other circulatory complications: Secondary | ICD-10-CM

## 2013-09-27 DIAGNOSIS — H9190 Unspecified hearing loss, unspecified ear: Secondary | ICD-10-CM

## 2013-09-27 DIAGNOSIS — Z8546 Personal history of malignant neoplasm of prostate: Secondary | ICD-10-CM

## 2013-09-27 DIAGNOSIS — I2510 Atherosclerotic heart disease of native coronary artery without angina pectoris: Secondary | ICD-10-CM

## 2013-09-27 DIAGNOSIS — Z9181 History of falling: Secondary | ICD-10-CM

## 2013-09-27 DIAGNOSIS — G309 Alzheimer's disease, unspecified: Secondary | ICD-10-CM | POA: Diagnosis not present

## 2013-09-27 DIAGNOSIS — M1990 Unspecified osteoarthritis, unspecified site: Secondary | ICD-10-CM

## 2013-09-27 DIAGNOSIS — F0280 Dementia in other diseases classified elsewhere without behavioral disturbance: Secondary | ICD-10-CM

## 2013-09-27 DIAGNOSIS — D649 Anemia, unspecified: Secondary | ICD-10-CM | POA: Insufficient documentation

## 2013-09-27 DIAGNOSIS — I10 Essential (primary) hypertension: Secondary | ICD-10-CM | POA: Diagnosis not present

## 2013-09-27 DIAGNOSIS — E538 Deficiency of other specified B group vitamins: Secondary | ICD-10-CM

## 2013-09-27 NOTE — Progress Notes (Signed)
Patient ID: [name], male   DOB: 1922-04-27, 80 y.o.   MRN: [id number]    Location:  [facility] (12)  PCP: Estill Dooms, MD  Code Status: DNR  Extended Emergency Contact Information Primary Emergency Contact: Allsbrook,[name] Address: [address]          [place], [place] [address] [name] of [place] Phone: [phone] Mobile Phone: [phone] Relation: Son  Allergies  Allergen Reactions  . Aspirin   . Nsaids     Chief Complaint  Patient presents with  . Medical Managment of Chronic Issues    blood pressure, blood sugar, Alzheimer, anemia    HPI:  Prior PCP Dr. Sandi Mariscal. Switching to [facility].Lives on AL mainly due to dementia.  Alzheimer disease: getting worse  HYPERTENSION: controlled  Type II or unspecified type diabetes mellitus with peripheral circulatory disorders, not stated as uncontrolled(250.70): controlled  CAD: asymptomatic  HEARING LOSS: severe and interferes with communication. Has hearing aides, but they do not help much  OSTEOARTHRITIS: no focal pain today  ANEMIA: chronic. Bone marrow failure is most likely  SEIZURE DISORDER: none recently  H/O prostate cancer: no evidence for relapse      Past Medical History  Diagnosis Date  . Anemia 2012  . Arthritis   . Diabetes mellitus 1997  . Hyperlipidemia   . Hypertension 2008  . Dementia in conditions classified elsewhere with behavioral disturbance   . Actinic keratosis   . Macular degeneration (senile) of retina, unspecified   . Epilepsy 1943  . Unspecified venous (peripheral) insufficiency   . Glaucoma   . H/O prostate cancer 2011    treated with radiation  . Senile dementia, uncomplicated   . Major depressive disorder, single episode, unspecified   . Other B-complex deficiencies 2012  . Other specified forms of hearing loss   . Unspecified vitamin D deficiency 2010  . Reflux esophagitis   . Generalized  osteoarthrosis, unspecified site   . Personal history of colonic polyps   . Coronary atherosclerosis of native coronary artery   . Type II or unspecified type diabetes mellitus with peripheral circulatory disorders, not stated as uncontrolled(250.70) 05/24/2009    Qualifier: Diagnosis of  By: Caryl Comes, MD, Remus Blake   . Alzheimer disease 09/13/2010    Qualifier: Diagnosis of  By: Nils Pyle CMA ([place]), Mearl Latin    . Alzheimer disease 09/15/2013    Past Surgical History  Procedure Laterality Date  . Appendectomy  1947  . Colonoscopy  10/22/10    multiple adenomatous polyps  . Tonsillectomy  1930  . Carpal tunnel release Bilateral 2005  . Myringotomy Right     CONSULTANTS Urol: Borden Heme: Murinson GI: D. Jaquita FoldsHerbert Deaner ENT: Elie Goody Derm: Jarome Matin Psch: Plovsky   Social History: History   Social History  . Marital Status: Married    Spouse Name: N/A    Number of Children: N/A  . Years of Education: N/A   Social History Main Topics  . Smoking status: Former Smoker    Types: Cigarettes    Quit date: 10/22/1963  . Smokeless tobacco: None  . Alcohol Use: 7.0 oz/week    14 drink(s) per week  . Drug Use: No  . Sexual Activity: No   Other Topics Concern  . None   Social History Narrative   Patient is Married, Physicist, medical.  Retired Human resources officer. Lives in  [place] section at [facility] since  2004, spouse lives in [place] unit in same community.   Quit Smoking 1983, no alcohol history   Patient has  Advanced planning documents: Living Will, DNR, MOST form          Family History Family Status  Relation Status Death Age  . Son Alive   . Mother Deceased 55    dementia, heart disease  . Father Deceased 63  . Sister Deceased   . Sister Deceased   . Son Deceased 35    mental illness   Family History  Problem Relation Age of Onset  . Heart disease Mother   . Dementia Mother      Medications: Patient's  Medications  New Prescriptions   No medications on file  Previous Medications   ACETAMINOPHEN (TYLENOL) 325 MG TABLET    Take 650 mg by mouth. Take 2 tablets every 6 hours as needed for pain   ATORVASTATIN (LIPITOR) 10 MG TABLET    Take 10 mg by mouth daily.   CHOLECALCIFEROL (VITAMIN D3) 1000 UNITS CAPS    Take by mouth.    CYANOCOBALAMIN (VITAMIN B 12 PO)    Take 1,000 mcg by mouth daily.   DEXTROMETHORPHAN (DELSYM) 30 MG/5ML LIQUID    Take by mouth. 1 tsp every 12 hours as needed for cough   DONEPEZIL (ARICEPT) 5 MG TABLET    Take 5 mg by mouth at bedtime.   FLUOCINONIDE-EMOLLIENT (LIDEX-E) 0.05 % CREAM    Apply 1 application topically 2 (two) times daily.   LATANOPROST (XALATAN) 0.005 % OPHTHALMIC SOLUTION    Place 1 drop into the right eye at bedtime.   LORATADINE (CLARITIN) 10 MG TABLET    Take 10 mg by mouth daily as needed for allergies.   MEMANTINE HCL ER (NAMENDA XR) 21 MG CP24    Take by mouth. Take one daily   METFORMIN (GLUCOPHAGE-XR) 500 MG 24 HR TABLET    Take 1,000 mg by mouth 2 (two) times daily.    METOPROLOL (TOPROL-XL) 50 MG 24 HR TABLET    Take 50 mg by mouth daily.     MULTIPLE VITAMINS-MINERALS (ICAPS MV) TABS    Take by mouth.     OMEGA-3 FATTY ACIDS (FISH OIL) 1000 MG CAPS    Take by mouth. Take one each morning   OMEPRAZOLE (PRILOSEC) 20 MG CAPSULE    Take 20 mg by mouth 2 (two) times daily.    PAROXETINE (PAXIL) 20 MG TABLET    Take 20 mg by mouth every morning.   POLYVINYL ALCOHOL (LIQUIFILM TEARS) 1.4 % OPHTHALMIC SOLUTION    Place 1 drop into both eyes. One drop four times daily as needed   SENNA (SENOKOT) 8.6 MG TABLET    Take 1 tablet by mouth daily.    TRIAMCINOLONE CREAM (KENALOG) 0.1 %    Apply topically 2 (two) times daily.  Modified Medications   No medications on file  Discontinued Medications   No medications on file    Immunization History  Administered Date(s) Administered  . Pneumococcal Polysaccharide-23 10/11/2009  . Td 10/11/2011  . Zoster  10/17/2010     Review of Systems  Constitutional: Negative for fever, chills, diaphoresis, activity change, appetite change and fatigue.  HENT: Positive for hearing loss. Negative for congestion, drooling, sore throat and tinnitus.   Eyes: Negative.   Respiratory: Negative for cough, shortness of breath and wheezing.   Cardiovascular: Negative for chest pain, palpitations and leg swelling.  Gastrointestinal: Positive for constipation. Negative for  nausea, abdominal pain, diarrhea, abdominal distention and rectal pain.       Heartburn  Endocrine: Negative.   Genitourinary: Negative for urgency, frequency and difficulty urinating.  Musculoskeletal: Positive for gait problem (unstable on standing. Needs assistance to walk.). Negative for arthralgias and back pain.  Skin: Negative for pallor, rash and wound.       Dry skin.  Allergic/Immunologic: Negative.   Neurological: Negative for dizziness, tremors, syncope and weakness.       Alzheimer's disease. Ataxic.  Hematological: Negative.   Psychiatric/Behavioral: Negative.       Filed Vitals:   09/27/13 1516  BP: 104/62  Pulse: 68  Height: 5' 5.5" (1.664 m)  Weight: 193 lb (87.544 kg)   Physical Exam  Constitutional: He appears well-nourished. No distress.  HENT:  Right Ear: External ear normal.  Left Ear: External ear normal.  Nose: Nose normal.  Mouth/Throat: Oropharynx is clear and moist.  Severe hearing loss compromises communication.  Eyes: Conjunctivae and EOM are normal. Pupils are equal, round, and reactive to light.  Left retinopathy and impaired vision  Neck: No JVD present. No tracheal deviation present. No thyromegaly present.  Cardiovascular: Normal rate, regular rhythm, normal heart sounds and intact distal pulses.  Exam reveals no gallop and no friction rub.   No murmur heard. Respiratory: No respiratory distress. He has no wheezes. He has no rales. He exhibits no tenderness.  GI: He exhibits no distension and  no mass. There is no tenderness.  Musculoskeletal: Normal range of motion. He exhibits no edema and no tenderness.  Very unstable on standing.  Lymphadenopathy:    He has no cervical adenopathy.  Neurological: He is alert. No cranial nerve deficit. Coordination normal.  Demented 01/11/13 MMSE 22/30. Failed clock drawing 07/02/13 MMSE 21/30. Failed clock drawing  Skin: No rash noted. No erythema. No pallor.  Psychiatric: He has a normal mood and affect.       Labs reviewed:  10/06/12 Hgb 8.6, MCV 93.1  CMP: glu 105, BUN 36, creat 1.43  Appointment on 01/13/2013  Component Date Value Ref Range Status  . WBC 01/13/2013 6.1  4.0 - 10.3 10e3/uL Final  . NEUT# 01/13/2013 4.1  1.5 - 6.5 10e3/uL Final  . HGB 01/13/2013 9.5* 13.0 - 17.1 g/dL Final  . HCT 01/13/2013 27.9* 38.4 - 49.9 % Final  . Platelets 01/13/2013 189  140 - 400 10e3/uL Final  . MCV 01/13/2013 94.6  79.3 - 98.0 fL Final  . MCH 01/13/2013 32.2  27.2 - 33.4 pg Final  . MCHC 01/13/2013 34.0  32.0 - 36.0 g/dL Final  . RBC 01/13/2013 2.95* 4.20 - 5.82 10e6/uL Final  . RDW 01/13/2013 15.1* 11.0 - 14.6 % Final  . lymph# 01/13/2013 1.0  0.9 - 3.3 10e3/uL Final  . MONO# 01/13/2013 0.6  0.1 - 0.9 10e3/uL Final  . Eosinophils Absolute 01/13/2013 0.3  0.0 - 0.5 10e3/uL Final  . Basophils Absolute 01/13/2013 0.0  0.0 - 0.1 10e3/uL Final  . NEUT% 01/13/2013 66.3  39.0 - 75.0 % Final  . LYMPH% 01/13/2013 16.6  14.0 - 49.0 % Final  . MONO% 01/13/2013 10.6  0.0 - 14.0 % Final  . EOS% 01/13/2013 5.7  0.0 - 7.0 % Final  . BASO% 01/13/2013 0.8  0.0 - 2.0 % Final  . LDH 01/13/2013 167  125 - 245 U/L Final  . Sodium 01/13/2013 141  136 - 145 mEq/L Final  . Potassium 01/13/2013 4.9  3.5 - 5.1 mEq/L Final  .  Chloride 01/13/2013 110* 98 - 107 mEq/L Final  . CO2 01/13/2013 23  22 - 29 mEq/L Final  . Glucose 01/13/2013 97  70 - 99 mg/dl Final  . BUN 01/13/2013 37.2* 7.0 - 26.0 mg/dL Final  . Creatinine 01/13/2013 1.6* 0.7 - 1.3 mg/dL  Final  . Total Bilirubin 01/13/2013 0.40  0.20 - 1.20 mg/dL Final  . Alkaline Phosphatase 01/13/2013 57  40 - 150 U/L Final  . AST 01/13/2013 14  5 - 34 U/L Final  . ALT 01/13/2013 8  0 - 55 U/L Final  . Total Protein 01/13/2013 7.2  6.4 - 8.3 g/dL Final  . Albumin 01/13/2013 3.3* 3.5 - 5.0 g/dL Final  . Calcium 01/13/2013 9.6  8.4 - 10.4 mg/dL Final    09/07/13 A1c 6.4  Vit D 35  Lipids: TC 150. Trig 107, HDL 57, LDL 72  CBC: Hgb 8.5, MCV 93.0, WBC 6.3, Plt 196  CMP: glu 99, BUN 35, creat 1.59  TSH 2.519  B12 899  Assessment/Plan  1. Alzheimer disease Worse. May need placement in skilled care in the next several months  2. HYPERTENSION Controlled  3. Type II or unspecified type diabetes mellitus with peripheral circulatory disorders, not stated as uncontrolled(250.70) controlled  4. CAD asymptomatic  5. HEARING LOSS severe  6. OSTEOARTHRITIS asymptomatic  7. ANEMIA Chronic. Possibly related to renal disease or to bone marrow failure.  8. SEIZURE DISORDER None recently  9. H/O prostate cancer In remission  10. Deaf severe  11. History of fall At risk for future falls  12. Other B-complex deficiencies B12  13. Hyperlipidemia controlled

## 2013-09-27 NOTE — Progress Notes (Deleted)
Patient ID: [name], male   DOB: [birth year], 80 y.o.   MRN: [id number]    Location:  [facility] (12)    Allergies  Allergen Reactions  . Aspirin   . Nsaids     Chief Complaint  Patient presents with  . Medical Managment of Chronic Issues    blood pressure, blood sugar, Alzheimer, anemia    HPI:  ***  Medications: Patient's Medications  New Prescriptions   No medications on file  Previous Medications   ACETAMINOPHEN (TYLENOL) 325 MG TABLET    Take 650 mg by mouth. Take 2 tablets every 6 hours as needed for pain   ATORVASTATIN (LIPITOR) 10 MG TABLET    Take 10 mg by mouth daily.   CHOLECALCIFEROL (VITAMIN D3) 1000 UNITS CAPS    Take by mouth.    CYANOCOBALAMIN (VITAMIN B 12 PO)    Take 1,000 mcg by mouth daily.   DEXTROMETHORPHAN (DELSYM) 30 MG/5ML LIQUID    Take by mouth. 1 tsp every 12 hours as needed for cough   DONEPEZIL (ARICEPT) 5 MG TABLET    Take 5 mg by mouth at bedtime.   FLUOCINONIDE-EMOLLIENT (LIDEX-E) 0.05 % CREAM    Apply 1 application topically 2 (two) times daily.   LATANOPROST (XALATAN) 0.005 % OPHTHALMIC SOLUTION    Place 1 drop into the right eye at bedtime.   LORATADINE (CLARITIN) 10 MG TABLET    Take 10 mg by mouth daily as needed for allergies.   MEMANTINE HCL ER (NAMENDA XR) 21 MG CP24    Take by mouth. Take one daily   METFORMIN (GLUCOPHAGE-XR) 500 MG 24 HR TABLET    Take 1,000 mg by mouth 2 (two) times daily.    METOPROLOL (TOPROL-XL) 50 MG 24 HR TABLET    Take 50 mg by mouth daily.     MULTIPLE VITAMINS-MINERALS (ICAPS MV) TABS    Take by mouth.     OMEGA-3 FATTY ACIDS (FISH OIL) 1000 MG CAPS    Take by mouth. Take one each morning   OMEPRAZOLE (PRILOSEC) 20 MG CAPSULE    Take 20 mg by mouth 2 (two) times daily.    PAROXETINE (PAXIL) 20 MG TABLET    Take 20 mg by mouth every morning.   POLYVINYL ALCOHOL (LIQUIFILM TEARS) 1.4 % OPHTHALMIC SOLUTION    Place 1 drop into both eyes. One drop four  times daily as needed   SENNA (SENOKOT) 8.6 MG TABLET    Take 1 tablet by mouth daily.    TRIAMCINOLONE CREAM (KENALOG) 0.1 %    Apply topically 2 (two) times daily.  Modified Medications   No medications on file  Discontinued Medications   No medications on file     Review of Systems  Filed Vitals:   09/27/13 1516  BP: 104/62  Pulse: 68  Height: 5' 5.5" (1.664 m)  Weight: 193 lb (87.544 kg)   Physical Exam   Labs reviewed: No visits with results within 3 Month(s) from this visit. Latest known visit with results is:  Appointment on 01/13/2013  Component Date Value Ref Range Status  . WBC 01/13/2013 6.1  4.0 - 10.3 10e3/uL Final  . NEUT# 01/13/2013 4.1  1.5 - 6.5 10e3/uL Final  . HGB 01/13/2013 9.5* 13.0 - 17.1 g/dL Final  . HCT 01/13/2013 27.9* 38.4 - 49.9 % Final  . Platelets 01/13/2013 189  140 - 400 10e3/uL Final  . MCV 01/13/2013 94.6  79.3 -  98.0 fL Final  . MCH 01/13/2013 32.2  27.2 - 33.4 pg Final  . MCHC 01/13/2013 34.0  32.0 - 36.0 g/dL Final  . RBC 01/13/2013 2.95* 4.20 - 5.82 10e6/uL Final  . RDW 01/13/2013 15.1* 11.0 - 14.6 % Final  . lymph# 01/13/2013 1.0  0.9 - 3.3 10e3/uL Final  . MONO# 01/13/2013 0.6  0.1 - 0.9 10e3/uL Final  . Eosinophils Absolute 01/13/2013 0.3  0.0 - 0.5 10e3/uL Final  . Basophils Absolute 01/13/2013 0.0  0.0 - 0.1 10e3/uL Final  . NEUT% 01/13/2013 66.3  39.0 - 75.0 % Final  . LYMPH% 01/13/2013 16.6  14.0 - 49.0 % Final  . MONO% 01/13/2013 10.6  0.0 - 14.0 % Final  . EOS% 01/13/2013 5.7  0.0 - 7.0 % Final  . BASO% 01/13/2013 0.8  0.0 - 2.0 % Final  . LDH 01/13/2013 167  125 - 245 U/L Final  . Sodium 01/13/2013 141  136 - 145 mEq/L Final  . Potassium 01/13/2013 4.9  3.5 - 5.1 mEq/L Final  . Chloride 01/13/2013 110* 98 - 107 mEq/L Final  . CO2 01/13/2013 23  22 - 29 mEq/L Final  . Glucose 01/13/2013 97  70 - 99 mg/dl Final  . BUN 01/13/2013 37.2* 7.0 - 26.0 mg/dL Final  . Creatinine 01/13/2013 1.6* 0.7 - 1.3 mg/dL Final  . Total  Bilirubin 01/13/2013 0.40  0.20 - 1.20 mg/dL Final  . Alkaline Phosphatase 01/13/2013 57  40 - 150 U/L Final  . AST 01/13/2013 14  5 - 34 U/L Final  . ALT 01/13/2013 8  0 - 55 U/L Final  . Total Protein 01/13/2013 7.2  6.4 - 8.3 g/dL Final  . Albumin 01/13/2013 3.3* 3.5 - 5.0 g/dL Final  . Calcium 01/13/2013 9.6  8.4 - 10.4 mg/dL Final      Assessment/Plan

## 2013-10-06 ENCOUNTER — Non-Acute Institutional Stay: Payer: Medicare Other | Admitting: Geriatric Medicine

## 2013-10-06 ENCOUNTER — Encounter: Payer: Self-pay | Admitting: Geriatric Medicine

## 2013-10-06 VITALS — BP 122/60 | HR 64

## 2013-10-06 DIAGNOSIS — M25569 Pain in unspecified knee: Secondary | ICD-10-CM

## 2013-10-06 DIAGNOSIS — I2510 Atherosclerotic heart disease of native coronary artery without angina pectoris: Secondary | ICD-10-CM

## 2013-10-06 NOTE — Assessment & Plan Note (Signed)
Left knee pian/ swelling. Possible effusion. No recent fall or other injury reported. Obtain 2 view X-ray

## 2013-10-06 NOTE — Progress Notes (Signed)
Patient ID: [name], male   DOB: [birth year], 80 y.o.   MRN: [id number]   [facility] [phone] Information   Name Relation Home Work [place] Son [phone]  [phone]       Chief Complaint  Patient presents with  . Knee Pain    HPI: This is a 80 y.o. male resident of [facility], Assisted Living section.  Evaluation is requested today due to knee pain and buckling.  Patient today is unable to stand and walk.    Allergies  Allergen Reactions  . Aspirin   . Nsaids     MEDICATIONS -  Reviewed  DATA REVIEWED  Radiologic Exams:   Laboratory Studies:   REVIEW OF SYSTEMS  DATA OBTAINED: from patient, nurse, medical record GENERAL: Feels well   No recent fever, fatigue, change in appetite or weight RESPIRATORY: No cough, wheezing, SOB CARDIAC: No chest pain, palpitations  No edema. MUSCULOSKELETAL: Patient denies knee pain - nurse reports he cannot stand or bear much weight on left NEUROLOGIC:   No change in mental status (dementia) PSYCHIATRIC: No sign of anxiety, depression  Sleeps well.  No behavior issue.    PHYSICAL EXAM Filed Vitals:   10/06/13 1311  BP: 122/60  Pulse: 64   There is no weight on file to calculate BMI.  GENERAL APPEARANCE: No acute distress, normal body habitus. Alert, pleasant, conversation very difficult due to [facility] SKIN: No diaphoresis, rash, RESPIRATORY: Breathing is even, unlabored. MUSCULOSKELETAL:  Left knee is swollen, tender medial/lateral, no redness or warmth.  Able to stand with assistance but knee buckles when he attempts full weight bearing NEUROLOGIC: Not Oriented to time, place, person (not new). PSYCHIATRIC: Mood and affect appropriate to situation   ASSESSMENT/PLAN  Knee pain, acute Left knee pian/ swelling. Possible effusion. No recent fall or other injury reported. Obtain 2 view X-ray    Family/ staff Communication:  Discussed today's  findings and plan with son, Gaspar Bidding. He recalls patient having "water drawn off the knee" 5-6 years ago but no other significant knee problem   Labs/tests ordered: 2 view Xray left knee   Follow up: Return if symptoms worsen or fail to improve, for As scheduled.  [name], NP-C [place] [phone]  10/06/2013

## 2013-10-07 ENCOUNTER — Non-Acute Institutional Stay (SKILLED_NURSING_FACILITY): Payer: Medicare Other | Admitting: Geriatric Medicine

## 2013-10-07 ENCOUNTER — Encounter: Payer: Self-pay | Admitting: Geriatric Medicine

## 2013-10-07 DIAGNOSIS — IMO0002 Reserved for concepts with insufficient information to code with codable children: Secondary | ICD-10-CM | POA: Diagnosis not present

## 2013-10-07 DIAGNOSIS — M1710 Unilateral primary osteoarthritis, unspecified knee: Secondary | ICD-10-CM

## 2013-10-07 DIAGNOSIS — M179 Osteoarthritis of knee, unspecified: Secondary | ICD-10-CM | POA: Insufficient documentation

## 2013-10-07 HISTORY — DX: Unilateral primary osteoarthritis, unspecified knee: M17.10

## 2013-10-07 HISTORY — DX: Osteoarthritis of knee, unspecified: M17.9

## 2013-10-07 NOTE — Progress Notes (Signed)
Patient ID: [name], male   DOB: [birth year], 80 y.o.   MRN: [id number]   [facility] [phone])   Contact Information   Name Relation Home Work [name] Son [phone]  [phone]       Chief Complaint  Patient presents with  . Knee Pain    HPI: This is a 80 y.o. male resident of [facility], Assisted Living section who was transferred to the rehabilitation section yesterday due to decreased functional status related to left knee pain and swelling. The patient was evaluated yesterday in the clinic due to this problem. He was started on a short course of NSAID and ice treatment. An x-ray was ordered and returned with evidence of osteoarthritis and small joint effusion.    Allergies  Allergen Reactions  . Aspirin   . Nsaids     MEDICATIONS -  Reviewed  DATA REVIEWED  Radiologic Exams  [facility] X-ray 10/06/2013 X-ray left knee, 3 views: Moderate to prominent osteoarthritic changes present primarily in the medial compartment and patellofemoral joint. On lateral view a small to moderate quantity of joint effusion is present in the suprapatellar area. Hypertrophic spur formation is noted in the anterior proximal patella. No definite fractures can be seen  Laboratory Studies:   REVIEW OF SYSTEMS  DATA OBTAINED: from patient, nurse, medical record GENERAL: Feels well   No recent fever, fatigue, change in appetite or weight RESPIRATORY: No cough, wheezing, SOB CARDIAC: No chest pain, palpitations  No edema. MUSCULOSKELETAL: Patient denies knee pain today, nursing staff reports he has been getting up and ambulating with walker today NEUROLOGIC:   No change in mental status (dementia) PSYCHIATRIC: No sign of anxiety, depression  Sleeps well.  No behavior issue.    PHYSICAL EXAM Filed Vitals:   10/07/13 1400  BP: 154/80  Pulse: 65  Temp: 98 F (36.7 C)  Resp: 20  SpO2: 93%   There is no weight on file to  calculate BMI.  GENERAL APPEARANCE: No acute distress, normal body habitus. Alert, pleasant, conversation very difficult due to [facility] SKIN: No diaphoresis, rash, RESPIRATORY: Breathing is even, unlabored. MUSCULOSKELETAL:  Left knee is non tender, not swollen, with full ROM. Able to stand to walker w/o assistance, ambulates across room and back to chair safely.  NEUROLOGIC: Not Oriented to time, place, person (not new). PSYCHIATRIC: Mood and affect appropriate to situation   ASSESSMENT/PLAN  Osteoarthritis of knee Yesterday with acute knee pain and mild swelling, significantly improved after treatment with ice and Aleve. Patient is now ambulating at his prior level. Recommend return to assisted living. Have asked physical therapy for safety evaluation    Family/ staff Communication:  Discussed today's findings and plan with son, Gaspar Bidding. He recalls patient having "water drawn off the knee" 5-6 years ago but no other significant knee problem   Labs/tests ordered:     Follow up: As scheduled in [facility] clinic, or as needed  [name], NP-C [place] [phone]  10/07/2013

## 2013-10-07 NOTE — Assessment & Plan Note (Signed)
Yesterday with acute knee pain and mild swelling, significantly improved after treatment with ice and Aleve. Patient is now ambulating at his prior level. Recommend return to assisted living. Have asked physical therapy for safety evaluation

## 2013-10-11 DIAGNOSIS — R269 Unspecified abnormalities of gait and mobility: Secondary | ICD-10-CM | POA: Diagnosis not present

## 2013-10-11 DIAGNOSIS — M6281 Muscle weakness (generalized): Secondary | ICD-10-CM | POA: Diagnosis not present

## 2013-10-11 DIAGNOSIS — M25569 Pain in unspecified knee: Secondary | ICD-10-CM | POA: Diagnosis not present

## 2013-10-12 ENCOUNTER — Encounter: Payer: Self-pay | Admitting: Geriatric Medicine

## 2013-10-19 ENCOUNTER — Non-Acute Institutional Stay: Payer: Medicare Other | Admitting: Geriatric Medicine

## 2013-10-19 ENCOUNTER — Encounter: Payer: Self-pay | Admitting: Geriatric Medicine

## 2013-10-19 DIAGNOSIS — M1990 Unspecified osteoarthritis, unspecified site: Secondary | ICD-10-CM | POA: Insufficient documentation

## 2013-10-19 DIAGNOSIS — M25559 Pain in unspecified hip: Secondary | ICD-10-CM | POA: Diagnosis not present

## 2013-10-19 DIAGNOSIS — M25569 Pain in unspecified knee: Secondary | ICD-10-CM | POA: Diagnosis not present

## 2013-10-19 NOTE — Progress Notes (Signed)
Patient ID: [name], male   DOB: 1922/01/27, 80 y.o.   MRN: [id number]   [place] [facility] [phone])  Contact Information   Name Relation Home Work [place] Son [phone]  [phone]       Chief Complaint  Patient presents with  . knee/ hip pain    HPI: This is a 80 y.o. male resident of [facility], Assisted Living section.  Evaluation is requested today due to right hip/ knee pain.  This patient was found on the floor last night in his apartment during routine wound check. He complains of bilateral leg pain was able to move both legs but unable to bear weight. He required assistance 3 to return to his recliner chair. He was given a dose of p.r.n. Tylenol. This morning patient still unable to stand or ambulate, he was given a dose of Aleve. X-ray of hip and knee hip femur and knee were ordered. Review of facility record shows nurse's note yesterday patient was complaining of right hip pain, he was given a dose of Tylenol.     Allergies  Allergen Reactions  . Aspirin   . Nsaids     MEDICATIONS -  Reviewed  DATA REVIEWED  Radiologic Exams  [facility] X-ray  10/06/2013 X-ray left knee, 3 views: Moderate to prominent osteoarthritic changes present primarily in the medial compartment and patellofemoral joint. On lateral view a small to moderate quantity of joint effusion is present in the suprapatellar area. Hypertrophic spur formation is noted in the anterior proximal patella. No definite fractures can be seen   Laboratory Studies:   REVIEW OF SYSTEMS  DATA OBTAINED: from patient, nurse, medical record GENERAL: Feels well  No recent fever, change in activity status, appetite, or weight. Falls asleep often at nurse's station RESPIRATORY: No cough, wheezing, SOB CARDIAC: No chest pain, palpitations. No edema GI: No abdominal pain  No Nausea,vomiting,diarrhea or constipation  No heartburn or reflux  MUSCULOSKELETAL:  Complaining of legs hurting, unable to elaborate - see HPI  NEUROLOGIC: No dizziness, fainting, headache, No change in mental status (moderate dementia) PSYCHIATRIC: No sign of anxiety, depression  Sleeps well   No behavior issue   PHYSICAL EXAM Filed Vitals:   10/19/13 1542  BP: 150/70  Pulse: 81  Temp: 97.8 F (36.6 C)  Resp: 18  SpO2: 93%   There is no weight on file to calculate BMI. GENERAL APPEARANCE: No acute distress, appropriately groomed, normal body habitus Sleepy, pleasant, minimally conversant. SKIN: No diaphoresis, rash, wound HEAD: Normocephalic, atraumatic EYES: Conjunctiva/lids clear  EARS: Very HOH RESPIRATORY: Breathing is even, unlabored  Lung sounds are clear and full  CARDIOVASCULAR: Heart RRR   No murmur or extra heart sounds   EDEMA: No peripheral edema   MUSCULOSKELETAL.  Bilateral knees are stiff and painful with flexion. No joint erythema or edema evident. No point tenderness in either hip. Tender medial knee joints bilaterally   PSYCHIATRIC: Mood and affect appropriate to situation    ASSESSMENT/PLAN  Osteoarthritis Pain bilateral knees/ rt hip today. No sign of acute injury, have requested x-ray due to unwitnessed fall last night. Likely pain is due to OA; scheduled acetaminophen, use naprosyn prn. (want to avoid daily use of NSAID due to pt's age, h/o anemia, HTN)    Family/ staff Communication:     Labs/tests ordered: Xray right hip and knee   Follow up: Return if symptoms worsen or fail to improve, for As scheduled.  Sheriann Newmann T.Kasem Mozer, NP-C  [place] [phone]  10/19/2013

## 2013-10-19 NOTE — Assessment & Plan Note (Addendum)
Pain bilateral knees/ rt hip today. No sign of acute injury, have requested x-ray due to unwitnessed fall last night. Likely pain is due to OA; scheduled acetaminophen, use naprosyn prn. (want to avoid daily use of NSAID due to pt's age, h/o anemia, HTN)

## 2013-10-27 DIAGNOSIS — R279 Unspecified lack of coordination: Secondary | ICD-10-CM | POA: Diagnosis not present

## 2013-10-27 DIAGNOSIS — M25569 Pain in unspecified knee: Secondary | ICD-10-CM | POA: Diagnosis not present

## 2013-10-27 DIAGNOSIS — R269 Unspecified abnormalities of gait and mobility: Secondary | ICD-10-CM | POA: Diagnosis not present

## 2013-11-09 ENCOUNTER — Encounter: Payer: Self-pay | Admitting: Geriatric Medicine

## 2013-11-09 ENCOUNTER — Non-Acute Institutional Stay (SKILLED_NURSING_FACILITY): Payer: Medicare Other | Admitting: Geriatric Medicine

## 2013-11-09 DIAGNOSIS — G309 Alzheimer's disease, unspecified: Secondary | ICD-10-CM | POA: Diagnosis not present

## 2013-11-09 DIAGNOSIS — IMO0002 Reserved for concepts with insufficient information to code with codable children: Secondary | ICD-10-CM | POA: Diagnosis not present

## 2013-11-09 DIAGNOSIS — M1710 Unilateral primary osteoarthritis, unspecified knee: Secondary | ICD-10-CM

## 2013-11-09 DIAGNOSIS — D649 Anemia, unspecified: Secondary | ICD-10-CM

## 2013-11-09 DIAGNOSIS — E538 Deficiency of other specified B group vitamins: Secondary | ICD-10-CM | POA: Diagnosis not present

## 2013-11-09 DIAGNOSIS — E1159 Type 2 diabetes mellitus with other circulatory complications: Secondary | ICD-10-CM

## 2013-11-09 DIAGNOSIS — F0280 Dementia in other diseases classified elsewhere without behavioral disturbance: Secondary | ICD-10-CM

## 2013-11-09 DIAGNOSIS — M179 Osteoarthritis of knee, unspecified: Secondary | ICD-10-CM

## 2013-11-09 MED ORDER — DICLOFENAC SODIUM 1 % TD GEL: 4.0000 g | Freq: Three times a day (TID) | TRANSDERMAL | Status: DC

## 2013-11-09 NOTE — Assessment & Plan Note (Signed)
A1C <7.0, eGFR <30. Reduce dose of metformin, follow fasting CBGs

## 2013-11-09 NOTE — Assessment & Plan Note (Signed)
Recent CBC with low but stable H/H. Repeat 12/09/2013

## 2013-11-09 NOTE — Progress Notes (Signed)
Patient ID: [name], male   DOB: 1922/05/25, 80 y.o.   MRN: [id number]   [facility] SNF (31)  Code Status: DNR, MOST form Contact Information   Name [name] [phone]  [phone]       Chief Complaint  Patient presents with  . Medical Management of Chronic Issues    Transfer to [facility] section    HPI: This is a 80 y.o. male resident of [place], transferred from assisted living to the Memory Care section on 10/22/2013 due to decreased functional status related to intermittent knee pain. Prior to this move the patient had a brief stay in the rehabilitation section for the same problem. X-rays of the knees have revealed osteoarthritis, no acute injuries. Patient also has known cognitive deficit which have been worsening over the last few months. Patient has adjusted very well to the memory care environment, he is friendly and cooperative with care. Medication is very difficult to to his profound hearing loss, but he does respond very well to one-to-one interactions.   Recent  Visits: 09/15/2013 HYPERTENSION Recnret BP range 100-105/61-65. D/C lisinopril. Update lab Vitamin B12 deficiency Continues supplement, update lab Type II or unspecified type diabetes mellitus with peripheral circulatory disorders, not stated as uncontrolled(250.70) No recent CBG or A1c available today. Most recent eGFR 28. Continue metformin for now, update lab Alzheimer disease Most recent MMSE score of 21/30 along with functional deficits in ADLs, including occasional incontinence are consistent with probable Alzheimer type dementia. No significant behavior issues recently. Continue donepezil and memantine. He remains appropriate for AL setting HEARING LOSS Significant hearing loss persists despite hearing aides . This contributes to cognitive difficulties. He benfits from supportive environment of AL. ANEMIA-IRON  DEFICIENCY Evaluated by Dr.Murinson 2014. Was taking iron and B12. Iron supplement not on current MAR. Update lab H/O prostate cancer Followed by Dr.Borden, last visit 03/2013. PSA undetectable.   10/19/2013 Osteoarthritis Pain bilateral knees/ rt hip today. No sign of acute injury, have requested x-ray due to unwitnessed fall last night. Likely pain is due to OA; scheduled acetaminophen, use naprosyn prn. (want to avoid daily use of NSAID due to pt's age, h/o anemia, HTN)      Allergies  Allergen Reactions  . Aspirin     MEDICATIONS -  Reviewed  DATA REVIEWED  Radiologic Exams   Quality mobile x-ray   10/06/2013 X-ray left knee, 3 views: Moderate to prominent osteoarthritic changes present primarily in the medial compartment and patellofemoral joint. On lateral view a small to moderate quantity of joint effusion is present in the suprapatellar area. Hypertrophic spur formation is noted in the anterior proximal patella. No definite fractures can be seen 10/19/2013  X-ray right hip: Moderate diffuse osteopenia. No acute fracture, malalignment or lytic destructive lesion. Degenerative irregularity of the right greater trochanter X-ray right knee: Moderate osteoarthritis. Mild diffuse osteopenia. No acute fracture, malalignment or lytic destructive lesion. Small degenerative spur the superior patella. Small to moderate-sized the joint effusion   Laboratory Studies Lab Results  Component Value Date   WBC 6.3 09/17/2013   HGB 8.5* 09/17/2013   HCT 25* 09/17/2013   MCV 93 01/13/2013   PLT 196 09/17/2013   Lab Results  Component Value Date   NA 138 09/17/2013   K 4.9 09/17/2013   GLU 99 09/17/2013   BUN 35* 09/17/2013   CREATININE 1.6* 09/17/2013   eGFR  28.8      09/17/2013   Lab  Results  Component Value Date   NA  01/13/2013   K  01/13/2013   BUN  01/13/2013   CREATININE  01/13/2013   Lab Results  Component Value Date   ALBUMIN 3.6 09/17/2013   AST 13* 09/17/2013   ALT 9*  09/17/2013   ALKPHOS 48 09/17/2013   BILITOT 0.40 01/13/2013   Lab Results  Component Value Date   VITAMINB12 899 09/18/2103    REVIEW OF SYSTEMS  DATA OBTAINED: from patient, nurse, medical record GENERAL: Feels well   No recent fever, fatigue, change in appetite or weight SKIN: No itch, rash or open wounds EYES: No eye pain, dryness or itching  No change in vision EARS: No earache, tinnitus, very poor hearing  NOSE: No congestion, drainage or bleeding MOUTH/THROAT: No mouth or tooth pain  No sore throat   No difficulty chewing or swallowing RESPIRATORY: No cough, wheezing, SOB CARDIAC: No chest pain, palpitations  No edema. GI: No abdominal pain  No nausea, vomiting,diarrhea or constipation  No heartburn or reflux  GU: No dysuria, frequency or urgency  No change in urine volume or character No nocturia or change in stream  occasionally incontinent MUSCULOSKELETAL: Complains of knee pain, some days both knees, sometimes one or the other. Gait is very unsteady, requires assist x2 for transfers, no ambulation  No recent falls.  NEUROLOGIC: No dizziness, fainting, headache,  No change in mental status(dementia)  PSYCHIATRIC: No feelings of anxiety, depression  Sleeps well.   Sometimes has inappropriate remarks to male staff and residents    PHYSICAL EXAM Filed Vitals:   11/09/13 1102  BP: 124/73  Pulse: 66  Temp: 97 F (36.1 C)  Resp: 20  Weight: 193 lb (87.544 kg)  SpO2: 96%   Body mass index is 31.62 kg/(m^2).  GENERAL APPEARANCE: No acute distress, appropriately groomed, normal body habitus. Alert, pleasant, conversant, though conversation is difficult due to hearing loss SKIN: No diaphoresis, rash, unusual lesions, wounds HEAD: Normocephalic, atraumatic EYES: Conjunctiva/lids clear. Pupils round, reactive.   EARS: Very poor hearing, speak into his right ear to be understood NOSE: No deformity or discharge. MOUTH/THROAT: Lips w/o lesions. Oral mucosa, tongue moist, w/o  lesion. Oropharynx w/o redness or lesions.  NECK: Supple, full ROM. No thyroid tenderness, enlargement or nodule LYMPHATICS: No head, neck or supraclavicular adenopathy RESPIRATORY: Breathing is even, unlabored. Lung sounds are clear and full.  CARDIOVASCULAR: Heart RRR. No murmur or extra heart sounds   EDEMA: No peripheral edema.  GASTROINTESTINAL: Abdomen is soft, non-tender, not distended w/ normal bowel sounds.  MUSCULOSKELETAL: Moves UE extremities with full ROM, strength and tone. Has difficulty following directions but today he has full range of motion left knee, decreased extension right knee. Mild, medial patellar tenderness bilaterally. No sign of joint effusion NEUROLOGIC: Not Oriented to time, place,Speech clear, no tremor.  PSYCHIATRIC: Mood and affect appropriate to situation   ASSESSMENT/PLAN  Osteoarthritis of knee Knee pain persists despite scheduled acetaminophen, has had ggod response to naprosyn. Will add topical NSAID to avoid systemic adverse effects of NSAID  Type II or unspecified type diabetes mellitus with peripheral circulatory disorders, not stated as uncontrolled(250.70) A1C <7.0, eGFR <30. Reduce dose of metformin, follow fasting CBGs  Vitamin B12 deficiency B12 level adequate, continue supplement (chronic PPI use)  Alzheimer disease Declin e in cognitive as well as functinal function have resulted in transfer to Memory Care section at [place]. Patient has made th eadjustment easily, participation in activitires is limited due to hearing deficit. He  does benefit from 1:1 interactions.   ANEMIA Recent CBC with low but stable H/H. Repeat 12/09/2013    Family/ staff Communication:    Time:   Labs/tests ordered: 12/09/2013 CBC   Follow up: Return for Routine/as needed.  [name], NP-C [place] [phone]  11/09/2013

## 2013-11-09 NOTE — Assessment & Plan Note (Signed)
Declin e in cognitive as well as functinal function have resulted in transfer to Memory Care section at [place]. Patient has made th eadjustment easily, participation in activitires is limited due to hearing deficit. He does benefit from 1:1 interactions.

## 2013-11-09 NOTE — Assessment & Plan Note (Signed)
Knee pain persists despite scheduled acetaminophen, has had ggod response to naprosyn. Will add topical NSAID to avoid systemic adverse effects of NSAID

## 2013-11-09 NOTE — Assessment & Plan Note (Signed)
B12 level adequate, continue supplement (chronic PPI use)

## 2013-11-12 ENCOUNTER — Encounter: Payer: Self-pay | Admitting: Gastroenterology

## 2013-11-19 DIAGNOSIS — F0281 Dementia in other diseases classified elsewhere with behavioral disturbance: Secondary | ICD-10-CM | POA: Diagnosis not present

## 2013-11-19 DIAGNOSIS — F02818 Dementia in other diseases classified elsewhere, unspecified severity, with other behavioral disturbance: Secondary | ICD-10-CM | POA: Diagnosis not present

## 2013-11-19 DIAGNOSIS — R488 Other symbolic dysfunctions: Secondary | ICD-10-CM | POA: Diagnosis not present

## 2013-11-19 DIAGNOSIS — R279 Unspecified lack of coordination: Secondary | ICD-10-CM | POA: Diagnosis not present

## 2013-11-19 DIAGNOSIS — M6281 Muscle weakness (generalized): Secondary | ICD-10-CM | POA: Diagnosis not present

## 2013-11-19 DIAGNOSIS — R41841 Cognitive communication deficit: Secondary | ICD-10-CM | POA: Diagnosis not present

## 2013-11-19 DIAGNOSIS — R269 Unspecified abnormalities of gait and mobility: Secondary | ICD-10-CM | POA: Diagnosis not present

## 2013-11-19 DIAGNOSIS — M25569 Pain in unspecified knee: Secondary | ICD-10-CM | POA: Diagnosis not present

## 2013-11-19 DIAGNOSIS — H547 Unspecified visual loss: Secondary | ICD-10-CM | POA: Diagnosis not present

## 2013-12-02 ENCOUNTER — Other Ambulatory Visit: Payer: Self-pay | Admitting: Geriatric Medicine

## 2013-12-02 DIAGNOSIS — E1159 Type 2 diabetes mellitus with other circulatory complications: Secondary | ICD-10-CM

## 2013-12-02 MED ORDER — METFORMIN HCL 1000 MG PO TABS: 1000.0000 mg | ORAL_TABLET | Freq: Two times a day (BID) | ORAL | Status: DC

## 2013-12-02 NOTE — Progress Notes (Signed)
Recent fasting CBGs >185 multiple mornings. PO intake most often>75% of meals. Resume metformin 1000mg  BID.

## 2013-12-09 DIAGNOSIS — D509 Iron deficiency anemia, unspecified: Secondary | ICD-10-CM | POA: Diagnosis not present

## 2013-12-10 ENCOUNTER — Non-Acute Institutional Stay (SKILLED_NURSING_FACILITY): Payer: Medicare Other | Admitting: Geriatric Medicine

## 2013-12-10 ENCOUNTER — Encounter: Payer: Self-pay | Admitting: Geriatric Medicine

## 2013-12-10 DIAGNOSIS — D509 Iron deficiency anemia, unspecified: Secondary | ICD-10-CM | POA: Diagnosis not present

## 2013-12-10 DIAGNOSIS — K921 Melena: Secondary | ICD-10-CM | POA: Diagnosis not present

## 2013-12-10 DIAGNOSIS — R509 Fever, unspecified: Secondary | ICD-10-CM | POA: Diagnosis not present

## 2013-12-10 DIAGNOSIS — R109 Unspecified abdominal pain: Secondary | ICD-10-CM | POA: Diagnosis not present

## 2013-12-10 LAB — CBC AND DIFFERENTIAL
HEMATOCRIT: 23 % — AB (ref 41–53)
Hemoglobin: 7.5 g/dL — AB (ref 13.5–17.5)
Platelets: 340 10*3/uL (ref 150–399)
WBC: 9.2 10^3/mL

## 2013-12-10 NOTE — Progress Notes (Signed)
Patient ID: [name], male   DOB: [birth year], 81 y.o.   MRN: [id number]   [facility] SN[facility] (31)  Code Status: DNR, MOST form  Contact Information   Name [name] [phone]  [phone]       Chief Complaint  Patient presents with  . Fatigue  . hypoxemia    HPI: This is a 80 y.o. male resident of [place], Memory Care section.  Evaluation is requested today due to lethargy and hypemia. Nurse called to report patient with abrupt change, very lethargic, O2 Saturation 70%.  OS quickly improved to 98% with supplemental O2. Review of record shows patietn has been noted lethargic during the day today also c/o back pain. Lab yesterday with decreased Hgb. Patient with h/o Fe deficient anemia, supplement started, Stool guaiac was ordered, no BM today.    Allergies  Allergen Reactions  . Aspirin     MEDICATIONS -  Reviewed  DATA REVIEWED  Radiologic Exams  Cardiovascular Exams  Laboratory Studies Lab Results  Component Value Date   WBC 6.3 09/17/2013   HGB 8.5* 09/17/2013   HCT 25* 09/17/2013   MCV 94.6 01/13/2013   PLT 196 09/17/2013   Lab Results  Component Value Date   NA 138 09/17/2013   K 4.9 09/17/2013   GLU 99 09/17/2013   BUN 35* 09/17/2013   CREATININE 1.6* 09/17/2013   Lab Results  Component Value Date   CALCIUM 9.6 01/13/2013   ALBUMIN 3.3* 01/13/2013   AST 13* 09/17/2013   ALT 9* 09/17/2013   ALKPHOS 48 09/17/2013   BILITOT 0.40 01/13/2013   Lab Results  Component Value Date   VITAMINB12 337 07/30/2011   Lab Results  Component Value Date   WBC 9.2 12/10/2013   HGB 7.5* 12/10/2013   HCT 23* 12/10/2013   MCV 94.6 01/13/2013   PLT 340 12/10/2013     REVIEW OF SYSTEMS  DATA OBTAINED: from patient, nurse, medical record GENERAL: See HPI.  SKIN: No itch, rash or open wounds MOUTH/THROAT: No mouth or tooth pain  No sore throat   RESPIRATORY: No cough, wheezing, SOB CARDIAC: No  chest pain,   No edema. GI: No abdominal pain  No nausea, vomiting,diarrhea   Ate breakfast and lunch today    LBM 12/07/13 GU: No dysuria, frequency or urgency  No change in urine volume or character  MUSCULOSKELETAL: Knee joint pain, stiffness  C/O back pain, indicates right flank NEUROLOGIC: No dizziness, fainting, headache,     PHYSICAL EXAM Filed Vitals:   12/10/13 1735  BP: 157/81  Pulse: 87  Temp: 101.9 F (38.8 C)  TempSrc: Rectal  SpO2: 91%   There is no weight on file to calculate BMI.  GENERAL APPEARANCE: Sitting in WC, easily awakened Alert, pleasant, conversation very difficult due to hearing losss. SKIN: No diaphoresis, rash,  HEAD: Normocephalic, atraumatic EYES: Conjunctiva/lids clear, pale Pupils round, reactive. Marland Kitchen  EARS: External exam WNL   Very poor hearing   NOSE: No deformity or discharge. MOUTH/THROAT: Lips w/o lesions. Oral mucosa, tongue moist, w/o lesion. Oropharynx w/o redness or lesions.  NECK: Supple, full ROM. No thyroid tenderness, enlargement or nodule LYMPHATICS: No head, neck or supraclavicular adenopathy RESPIRATORY: Breathing is even, unlabored. Lung sounds are clear and full.  CARDIOVASCULAR: Heart RRR. No murmur or extra heart sounds GASTROINTESTINAL: Abdomen is distended, non-tender,w/ decreased bowel sounds. Tender rt. flank  RECTAL: No anal fissure, skin tag or external hemorrhoid. Sphincter  tone WNL. Stool is brown, heme positive. NEUROLOGIC: Not Oriented to time, place,Speech clear, no tremor.  PSYCHIATRIC: Distracted, falls asleep  ASSESSMENT/PLAN  Fever, unspecified New onset fever, rt. Sided flank pain, decreased alertness (intermittent lethargy today). Abdomen distended, no BM x 3 days, stool guaiac positive, no gross rectal bleeding. CBC yesterday showed worsening anemia. Differential includes GI bleed, constipation, UTI, urinary stone, AAA.  BP/P are stable. Collect urine, give dose of IM Rocephin. Obtain abdominal/ CXR.  Discussed  findings/ plan with family via telephone. Nursing staff will keep them informed of changes/results    Family/ staff Communication:  See above   Time: 60 minutes, >50% spent counseling/or care coordination  Labs/tests ordered: CXR, Abd. X-ray. CBc, BMP, U/A C&S   Follow up: No Follow-up on file.  [name], NP-C [place] [phone]  12/10/2013

## 2013-12-10 NOTE — Assessment & Plan Note (Signed)
New onset fever, rt. Sided flank pain, decreased alertness (intermittent lethargy today). Abdomen distended, no BM x 3 days, stool guaiac positive, no gross rectal bleeding. CBC yesterday showed worsening anemia. Differential includes GI bleed, constipation, UTI, urinary stone, AAA.  BP/P are stable. Collect urine, give dose of IM Rocephin. Obtain abdominal/ CXR.  Discussed findings/ plan with family via telephone. Nursing staff will keep them informed of changes/results

## 2013-12-14 ENCOUNTER — Encounter: Payer: Self-pay | Admitting: Adult Health

## 2013-12-14 ENCOUNTER — Non-Acute Institutional Stay (SKILLED_NURSING_FACILITY): Payer: Medicare Other | Admitting: Adult Health

## 2013-12-14 DIAGNOSIS — D509 Iron deficiency anemia, unspecified: Secondary | ICD-10-CM | POA: Diagnosis not present

## 2013-12-14 DIAGNOSIS — R195 Other fecal abnormalities: Secondary | ICD-10-CM | POA: Diagnosis not present

## 2013-12-14 DIAGNOSIS — R509 Fever, unspecified: Secondary | ICD-10-CM

## 2013-12-14 DIAGNOSIS — K5900 Constipation, unspecified: Secondary | ICD-10-CM | POA: Diagnosis not present

## 2013-12-14 LAB — BASIC METABOLIC PANEL
BUN: 25 mg/dL — AB (ref 4–21)
Creatinine: 1.4 mg/dL — AB (ref 0.6–1.3)
GLUCOSE: 170 mg/dL
Potassium: 4.4 mmol/L (ref 3.4–5.3)
Sodium: 138 mmol/L (ref 137–147)

## 2013-12-14 LAB — CBC AND DIFFERENTIAL
HCT: 22 % — AB (ref 41–53)
HEMOGLOBIN: 7.2 g/dL — AB (ref 13.5–17.5)
Platelets: 345 10*3/uL (ref 150–399)
WBC: 11.2 10^3/mL

## 2013-12-14 NOTE — Assessment & Plan Note (Signed)
He has improved today with a BM. He is afebrile and is not requiring oxygen. His abd is softer. He is currently on Senokot but will add Miralax 17 gm p.o. QD.  His episode on 12/10/13 most likely is due to constipation.

## 2013-12-14 NOTE — Assessment & Plan Note (Signed)
Resolved. CXR, ABD xray, and UA with C and S showed no signs of infection. His fever may have been due to constipation.

## 2013-12-14 NOTE — Progress Notes (Signed)
Patient ID: [name], male   DOB: 1921/08/15, 80 y.o.   MRN: [id number]     [facility] [phone])    Chief Complaint  Patient presents with  . Fever    HPI: This is a 80 y.o. Male resident in the Memory Care section of [place]. He was seen on 12/10/13 due to c/o back pain and fever. He had not had a B.M. For 3 days. He was also hypoxic and anemic.  A urine specimen was sent showing no growth. A CXR was completed showing no acute abnormality. An ABD xray was completed showing no acute finding, but a nonspecific, non-obstructive appearing gas pattern was noted. He was noted on 12/10/13 to have a fever of 101.9.  He was given 1G of Rocephin on 12/10/13.  He was noted to have guiac positive stool and started on ferrous sulfate. His HGb on 12/09/13 was noted to be 7.5 and was previously 8.5 on 09/17/13.    Today the staff reported after several doses of Dulcolax and Miralax he was able to have a bm. It was soft and no blood was noted. He reports that his back pain has improved as well. He is not on oxygen and was able to eat breakfast.     Allergies  Allergen Reactions  . Aspirin     MEDICATIONS -  reviewed  DATA REVIEWED  Radiologic Exams:  12/11/13 PCXR: No acute cardiac or pulmonary pathology. Mild prominence of the heart. Left basilar atx. 12/11/13 Abd xray: No acute abd abnormality, nonspecific, but non-obstructive-appearing bowel gas pattern.  Laboratory Studies: Lab Results  Component Value Date   WBC 11.2 12/10/2013   HGB 7.2* 12/10/2013   HCT 22* 12/10/2013   MCH 29.0 12/10/2013   MCV 86.7 12/10/2013   PLT 345 12/10/2013   Lab Results  Component Value Date   NA 138 12/10/2013   K 4.4 12/10/2013   GLU 170 12/10/2013   BUN 25* 12/10/2013   CREATININE 1.4* 12/10/2013    12/10/13: UA C and S: No growth.   REVIEW OF SYSTEMS  DATA OBTAINED: from patient, nurse, medical record GENERAL: Not feeling well. No fever today, some fatigue.  Appetite ok per staff  RESPIRATORY: No cough, wheezing, SOB CARDIAC: No chest pain, palpitations. No edema GI: No abdominal pain  No Nausea,vomiting,diarrhea. Constipation noted. No heartburn or reflux. No BRBPR. MUSCULOSKELETAL: Joint pain to both knees. Weakness to both legs per staff. Uses the hoyer lift for all transfers. NEUROLOGIC: No dizziness, fainting, headache, numbness No change in mental status  PSYCHIATRIC: No feelings of anxiety, depression  Sleeps well   No behavior issue   PHYSICAL EXAM Filed Vitals:   12/14/13 0951  BP: 165/82  Pulse: 74  Temp: 97.8 F (36.6 C)  Resp: 20  SpO2: 93%   There is no weight on file to calculate BMI.  GENEpale.RAL APPEARANCE: No acute distress, appropriately groomed, normal body habitus.   Pleasant in conversation. SKIN: No diaphoresis, rash, wound HEAD: Normocephalic, atraumatic  RESPIRATORY: Breathing is even, unlabored  Lung sounds are clear with decreased bases CARDIOVASCULAR: Heart RRR   No murmur or extra heart sounds   EDEMA: No peripheral edema   ABD: Abd slightly distended and slightly firm. BSX4 noted to be hyperactive MUSCULOSKELETAL.  No spinous processes tenderness. Decreased ROM to both knees and hips PSYCHIATRIC: Mood and affect appropriate to situation . Sleepy  ASSESSMENT/PLAN  ANEMIA-IRON DEFICIENCY Continue Ferrous Sulfate and check CBC on Thursday 12/16/13  Fever, unspecified Resolved. CXR, ABD xray, and UA with C and S showed no signs of infection. His fever may have been due to constipation.  Unspecified constipation He has improved today with a BM. He is afebrile and is not requiring oxygen. His abd is softer. He is currently on Senokot but will add Miralax 17 gm p.o. QD.  His episode on 12/10/13 most likely is due to constipation.  Heme positive stool Noted on 12/10/13.  No obvious signs of bleeding at this time. We will discontinue the Aricept and Voltaren gel due to increased risk of bleeding. Given his  debility aggressive therapy would not be appropriate. Monitor closely.    Family/ staff Communication: Spoke with the nurse and she reports that his abd is softer today after having a BM. She also reports that he is going to the orthopedic tomorrow regarding his knee pain.   Labs/tests ordered: F/U CBC   Follow up: Return for Routine/as needed.  Emmanuel Ercole T.Gaile Allmon, NP-C/[employer], MSN [facility] [phone]  12/14/2013

## 2013-12-14 NOTE — Assessment & Plan Note (Signed)
Noted on 12/10/13.  No obvious signs of bleeding at this time. We will discontinue the Aricept and Voltaren gel due to increased risk of bleeding. Given his debility aggressive therapy would not be appropriate. Monitor closely.

## 2013-12-14 NOTE — Assessment & Plan Note (Signed)
Continue Ferrous Sulfate and check CBC on Thursday 12/16/13

## 2013-12-15 ENCOUNTER — Encounter: Payer: Self-pay | Admitting: Geriatric Medicine

## 2013-12-15 DIAGNOSIS — M1710 Unilateral primary osteoarthritis, unspecified knee: Secondary | ICD-10-CM | POA: Diagnosis not present

## 2013-12-16 DIAGNOSIS — D509 Iron deficiency anemia, unspecified: Secondary | ICD-10-CM | POA: Diagnosis not present

## 2013-12-16 DIAGNOSIS — D649 Anemia, unspecified: Secondary | ICD-10-CM | POA: Diagnosis not present

## 2013-12-16 LAB — CBC AND DIFFERENTIAL
HCT: 22 % — AB (ref 41–53)
HEMOGLOBIN: 7 g/dL — AB (ref 13.5–17.5)
PLATELETS: 526 10*3/uL — AB (ref 150–399)
WBC: 10.8 10*3/mL

## 2013-12-20 DIAGNOSIS — H547 Unspecified visual loss: Secondary | ICD-10-CM | POA: Diagnosis not present

## 2013-12-20 DIAGNOSIS — M25569 Pain in unspecified knee: Secondary | ICD-10-CM | POA: Diagnosis not present

## 2013-12-20 DIAGNOSIS — F02818 Dementia in other diseases classified elsewhere, unspecified severity, with other behavioral disturbance: Secondary | ICD-10-CM | POA: Diagnosis not present

## 2013-12-20 DIAGNOSIS — M6281 Muscle weakness (generalized): Secondary | ICD-10-CM | POA: Diagnosis not present

## 2013-12-20 DIAGNOSIS — F0281 Dementia in other diseases classified elsewhere with behavioral disturbance: Secondary | ICD-10-CM | POA: Diagnosis not present

## 2013-12-23 ENCOUNTER — Encounter: Payer: Self-pay | Admitting: Geriatric Medicine

## 2013-12-23 ENCOUNTER — Non-Acute Institutional Stay (SKILLED_NURSING_FACILITY): Payer: Medicare Other | Admitting: Geriatric Medicine

## 2013-12-23 DIAGNOSIS — E1159 Type 2 diabetes mellitus with other circulatory complications: Secondary | ICD-10-CM | POA: Diagnosis not present

## 2013-12-23 DIAGNOSIS — R05 Cough: Secondary | ICD-10-CM | POA: Diagnosis not present

## 2013-12-23 DIAGNOSIS — D649 Anemia, unspecified: Secondary | ICD-10-CM | POA: Diagnosis not present

## 2013-12-23 DIAGNOSIS — IMO0002 Reserved for concepts with insufficient information to code with codable children: Secondary | ICD-10-CM | POA: Diagnosis not present

## 2013-12-23 DIAGNOSIS — D509 Iron deficiency anemia, unspecified: Secondary | ICD-10-CM | POA: Diagnosis not present

## 2013-12-23 DIAGNOSIS — M1710 Unilateral primary osteoarthritis, unspecified knee: Secondary | ICD-10-CM

## 2013-12-23 DIAGNOSIS — M179 Osteoarthritis of knee, unspecified: Secondary | ICD-10-CM

## 2013-12-23 DIAGNOSIS — R059 Cough, unspecified: Secondary | ICD-10-CM | POA: Insufficient documentation

## 2013-12-23 LAB — BASIC METABOLIC PANEL
BUN: 19 mg/dL (ref 4–21)
CREATININE: 1.1 mg/dL (ref 0.6–1.3)
Glucose: 160 mg/dL
POTASSIUM: 4.4 mmol/L (ref 3.4–5.3)
SODIUM: 139 mmol/L (ref 137–147)

## 2013-12-23 LAB — CBC AND DIFFERENTIAL
HCT: 25 % — AB (ref 41–53)
Hemoglobin: 8.1 g/dL — AB (ref 13.5–17.5)
Platelets: 461 10*3/uL — AB (ref 150–399)
WBC: 10.7 10^3/mL

## 2013-12-23 MED ORDER — METFORMIN HCL 500 MG PO TABS: 500.0000 mg | ORAL_TABLET | Freq: Every day | ORAL | Status: DC

## 2013-12-23 MED ORDER — INSULIN GLARGINE 100 UNIT/ML SOLOSTAR PEN: 10.0000 [IU] | PEN_INJECTOR | Freq: Every day | SUBCUTANEOUS | Status: DC

## 2013-12-23 NOTE — Progress Notes (Signed)
Patient ID: [name], male   DOB: [birth year], 80 y.o.   MRN: [id number]   [facility] [phone])   Contact Information   Name Relation Home Work [place] Son [phone]  [phone]       Chief Complaint  Patient presents with  . Respiratory issues    HPI: This is a 80 y.o. male resident of [place], [facility]   section.  Evaluation is requested today due in followup of respiratory status. Received a phone call last night from the nurse she reported this patient with a wet cough and yellow phlegm. She noted wheezing at the right base, Temp 100.2 F, O2 saturation  97%, he was lethargic.  A chest x-ray was ordered, results obtained this morning. No acute respiratory findings.  Last visit: ANEMIA-IRON DEFICIENCY Continue Ferrous Sulfate and check CBC on Thursday 12/16/13 Fever, unspecified Resolved. CXR, ABD xray, and UA with C and S showed no signs of infection. His fever may have been due to constipation. Unspecified constipation He has improved today with a BM. He is afebrile and is not requiring oxygen. His abd is softer. He is currently on Senokot but will add Miralax 17 gm p.o. QD.  His episode on 12/10/13 most likely is due to constipation. Heme positive stool Noted on 12/10/13.  No obvious signs of bleeding at this time. We will discontinue the Aricept and Voltaren gel due to increased risk of bleeding. Given his debility aggressive therapy would not be appropriate. Monitor closely.  Since last visit patient was evaluated by Dr. Sharol Given to for his bilateral knee pain. He received injections in each knee. He further recommends PT for gait training and Tylenol for pain management. Did not think knee brace would be useful.  Patient continues to have significant knee pain with transfers, requires mechanical lift. Repeat lab on 5/28 with little change. Repeat CBC BMP today with improvement in hemoglobin hematocrit, BMP is  stable. Review of facility records shows patient has been having regular bowel movements daily. P.o. intake is fair; 50-75% most meals. Vital signs have been stable, fasting CBGs are consistently greater than 160.    Allergies  Allergen Reactions  . Aspirin     MEDICATIONS -  Reviewed  DATA REVIEWED  Radiologic Exams  Quality mobile x-ray 12/11/13 PCXR: No acute cardiac or pulmonary pathology. Mild prominence of the heart. Left basilar atx. 12/11/13 Abd xray: No acute abd abnormality, nonspecific, but non-obstructive-appearing bowel gas pattern.  12/23/2013 chest x-ray: Heart size is mildly enlarged. No pulmonary vascular redistribution. Lungs are clear suspected new acute infiltrate, edema, or effusion. Left basilar subsegmental atelectatic changes noted.   Laboratory Studies: Lab Results  Component Value Date   WBC 10.8 12/16/2013   HGB 7.0* 12/16/2013   HCT 22* 12/16/2013   MCV 94.6 01/13/2013   PLT 526* 12/16/2013   Lab Results  Component Value Date   NA 138 12/14/2013   K 4.4 12/14/2013   GLU 170 12/14/2013   BUN 25* 12/14/2013   CREATININE 1.4* 12/14/2013   Lab Results  Component Value Date   WBC 10.7 12/23/2013   HGB 8.1* 12/23/2013   HCT 25* 12/23/2013   MCV 94.6 01/13/2013   PLT 461* 12/23/2013   Lab Results  Component Value Date   NA 139 12/23/2013   K 4.4 12/23/2013   GLU 160 12/23/2013   BUN 19 12/23/2013   CREATININE 1.1 12/23/2013     REVIEW OF SYSTEMS  DATA OBTAINED:  from patient, nurse, medical record, GENERAL: "Nothing wrong with me"   RESPIRATORY: No cough, wheezing, SOB CARDIAC: No chest pain, palpitations. No edema GI: No abdominal pain  No Nausea,vomiting,diarrhea or constipation  No heartburn or reflux  MUSCULOSKELETAL: Bilateral knee pain, No swelling or stiffness   No back pain   General weakness per OT report   NEUROLOGIC: No dizziness, fainting, headache, No change in mental status (dementia) PSYCHIATRIC: No feelings of anxiety, depression  Sleeps well   No  behavior issue   PHYSICAL EXAM Filed Vitals:   12/23/13 1057  BP: 141/77  Pulse: 87  Temp: 97.3 F (36.3 C)  Resp: 20  Weight: 184 lb 12.8 oz (83.825 kg)  SpO2: 95%   Body mass index is 30.27 kg/(m^2).  GENERAL APPEARANCE: No acute distress, appropriately groomed, normal body habitus Alert, pleasant, conversant. Conversation difficult due to hearing deficit SKIN: No diaphoresis, rash, wound HEAD: Normocephalic, atraumatic EYES: Conjunctiva/lids clear  RESPIRATORY: Breathing is even, unlabored  Lung sounds are clear and full  CARDIOVASCULAR: Heart RRR   No murmur or extra heart sounds   EDEMA: No peripheral edema   PSYCHIATRIC: Mood and affect appropriate to situation    ASSESSMENT/PLAN  Cough White productive cough last evening, chest x-ray without evidence of pulmonary infiltrate. Physical exam today is within normal limits. No interventions regarding respiratory status at this time, the nursing staff will continue to monitor closely.  ANEMIA-IRON DEFICIENCY Mild improvement in most recent H&H, continue iron supplement. Repeat CBC one month  Osteoarthritis of knee The patient is status post bilaterally injections to reduce pain related to osteoarthritis. Minimal improvement so far. Continue Tylenol, avoidance aids do to anemia and GI irritation. Continue therapy interventions as he tolerates, utilize the safest means for transfers  Type II or unspecified type diabetes mellitus with peripheral circulatory disorders, not stated as uncontrolled(250.70) Metformin dose reduced April 2014, resumed 1000 mg b.i.d. to 2 elevated fasting CBGs. CBGs continued to be greater than 160 at this dose of metformin, patient would likely benefit from change of therapy to insulin.     Family/ staff Communication:  Discussed today's findings and plan with son, Gaspar Bidding.    Labs/tests ordered: 01/25/14 CBC, BMP   Follow up: Return for Routine/as needed.   [name], NP-C [place] [phone]  12/23/2013

## 2013-12-23 NOTE — Assessment & Plan Note (Signed)
The patient is status post bilaterally injections to reduce pain related to osteoarthritis. Minimal improvement so far. Continue Tylenol, avoidance aids do to anemia and GI irritation. Continue therapy interventions as he tolerates, utilize the safest means for transfers

## 2013-12-23 NOTE — Assessment & Plan Note (Signed)
Metformin dose reduced April 2014, resumed 1000 mg b.i.d. to 2 elevated fasting CBGs. CBGs continued to be greater than 160 at this dose of metformin, patient would likely benefit from change of therapy to insulin.

## 2013-12-23 NOTE — Assessment & Plan Note (Signed)
White productive cough last evening, chest x-ray without evidence of pulmonary infiltrate. Physical exam today is within normal limits. No interventions regarding respiratory status at this time, the nursing staff will continue to monitor closely.

## 2013-12-23 NOTE — Assessment & Plan Note (Signed)
Mild improvement in most recent H&H, continue iron supplement. Repeat CBC one month

## 2014-01-06 DIAGNOSIS — I2510 Atherosclerotic heart disease of native coronary artery without angina pectoris: Secondary | ICD-10-CM | POA: Diagnosis not present

## 2014-01-06 DIAGNOSIS — R569 Unspecified convulsions: Secondary | ICD-10-CM | POA: Diagnosis not present

## 2014-01-06 DIAGNOSIS — E1159 Type 2 diabetes mellitus with other circulatory complications: Secondary | ICD-10-CM | POA: Diagnosis not present

## 2014-01-06 DIAGNOSIS — F0280 Dementia in other diseases classified elsewhere without behavioral disturbance: Secondary | ICD-10-CM | POA: Diagnosis not present

## 2014-01-06 DIAGNOSIS — M1990 Unspecified osteoarthritis, unspecified site: Secondary | ICD-10-CM | POA: Diagnosis not present

## 2014-01-06 DIAGNOSIS — I10 Essential (primary) hypertension: Secondary | ICD-10-CM | POA: Diagnosis not present

## 2014-01-18 ENCOUNTER — Non-Acute Institutional Stay (SKILLED_NURSING_FACILITY): Payer: Medicare Other | Admitting: Nurse Practitioner

## 2014-01-18 DIAGNOSIS — K5900 Constipation, unspecified: Secondary | ICD-10-CM

## 2014-01-18 DIAGNOSIS — R946 Abnormal results of thyroid function studies: Secondary | ICD-10-CM | POA: Diagnosis not present

## 2014-01-18 DIAGNOSIS — E1159 Type 2 diabetes mellitus with other circulatory complications: Secondary | ICD-10-CM

## 2014-01-18 DIAGNOSIS — R5381 Other malaise: Secondary | ICD-10-CM | POA: Diagnosis not present

## 2014-01-18 DIAGNOSIS — G309 Alzheimer's disease, unspecified: Secondary | ICD-10-CM

## 2014-01-18 DIAGNOSIS — M170 Bilateral primary osteoarthritis of knee: Secondary | ICD-10-CM

## 2014-01-18 DIAGNOSIS — M1710 Unilateral primary osteoarthritis, unspecified knee: Secondary | ICD-10-CM

## 2014-01-18 DIAGNOSIS — D649 Anemia, unspecified: Secondary | ICD-10-CM | POA: Diagnosis not present

## 2014-01-18 DIAGNOSIS — R5383 Other fatigue: Secondary | ICD-10-CM

## 2014-01-18 DIAGNOSIS — D509 Iron deficiency anemia, unspecified: Secondary | ICD-10-CM

## 2014-01-18 DIAGNOSIS — IMO0002 Reserved for concepts with insufficient information to code with codable children: Secondary | ICD-10-CM

## 2014-01-18 DIAGNOSIS — F0280 Dementia in other diseases classified elsewhere without behavioral disturbance: Secondary | ICD-10-CM

## 2014-01-18 DIAGNOSIS — I10 Essential (primary) hypertension: Secondary | ICD-10-CM

## 2014-01-18 NOTE — Progress Notes (Signed)
Patient ID: [name], male   DOB: 1922/03/09, 80 y.o.   MRN: [id number]    Nursing Home Location:  [place] of Service: SNF (31)  PCP: Estill Dooms, MD  Allergies  Allergen Reactions  . Aspirin     Chief Complaint  Patient presents with  . Medical Management of Chronic Issues    HPI:  HPI: This is a 80 y.o. male resident of [place], [facility]   section.  pt is being seen today for routine management of chronic conditions. Staff is concerned over increase lethargy. Unable to participate in therapy due to falling asleep. Has episode of fever and drop in sats last month and was worked up for this which did not reveal a pneumonia or any other findings. Staff reports for months he has been declining with mobility. seeing ortho for knee injections due to pain.  Pt now falling asleep at the table and has decreased PO intake and has noted weight loss. Pt also with iron def anemia, was started on iron and has follow up cbc schedule. Since he has been on iron there has been increase in constipation which is also an issue per nursing.    Review of Systems:  DATA OBTAINED: from patient, nurse, medical record, GENERAL: increased lethargy    RESPIRATORY: No cough, wheezing, SOB CARDIAC: No chest pain, palpitations. No edema GI: No abdominal pain  No Nausea,vomiting,diarrhea or constipation  No heartburn or reflux   MUSCULOSKELETAL: Bilateral knee pain, No swelling or stiffness   No back pain   General weakness per staff report    NEUROLOGIC: No dizziness, fainting, headache, No change in mental status (dementia) PSYCHIATRIC: No feelings of anxiety, depression  Sleeps well   No behavior issue   Past Medical History  Diagnosis Date  . Anemia 2012  . Arthritis   . Diabetes mellitus 1997  . Hyperlipidemia   . Hypertension 2008  . Actinic keratosis   . Macular degeneration (senile) of retina, unspecified   . Epilepsy 1943  .  Unspecified venous (peripheral) insufficiency   . Glaucoma   . H/O prostate cancer 2011    treated with radiation  . Major depressive disorder, single episode, unspecified   . Other B-complex deficiencies 2012  . Unspecified vitamin D deficiency 2010  . Reflux esophagitis 2002  . Generalized osteoarthrosis, unspecified site   . Personal history of colonic polyps 2012    adenomatous  . Coronary atherosclerosis of native coronary artery 2002    60% stenois of the 1st diagonal of the LAD  . Type II or unspecified type diabetes mellitus with peripheral circulatory disorders, not stated as uncontrolled(250.70) 05/24/2009    Qualifier: Diagnosis of  By: Caryl Comes, MD, Remus Blake   . Alzheimer disease 09/13/2008    Qualifier: Diagnosis of  By: Nils Pyle CMA ([place]), Mearl Latin    . History of fall 2013  . Deaf   . 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 Gastric ulcer 2012  . Osteoarthritis of knee 10/07/2013    Qualifier: Diagnosis of  By: Nils Pyle CMA Deborra Medina), Mearl Latin     Past Surgical History  Procedure Laterality Date  . Appendectomy  1947  . Colonoscopy  10/22/10    multiple adenomatous polyps  . Tonsillectomy  1930  . Carpal tunnel release Bilateral 2005  . Myringotomy Right    Social History:   reports that he quit smoking about  50 years ago. His smoking use included Cigarettes. He smoked 0.00 packs per day. He does not have any smokeless tobacco history on file. He reports that he drinks about 7 ounces of alcohol per week. He reports that he does not use illicit drugs.  Family History  Problem Relation Age of Onset  . Heart disease Mother   . Dementia Mother     Medications: Patient's Medications  New Prescriptions   No medications on file  Previous Medications   ACETAMINOPHEN (TYLENOL) 325 MG TABLET    Take 650 mg by mouth 3 (three) times daily.    CHOLECALCIFEROL (VITAMIN D3) 1000 UNITS CAPS    Take by mouth.    CYANOCOBALAMIN  (VITAMIN B 12 PO)    Take 1,000 mcg by mouth daily.   DEXTROMETHORPHAN (DELSYM) 30 MG/5ML LIQUID    Take by mouth. 1 tsp every 12 hours as needed for cough   DICLOFENAC SODIUM (VOLTAREN) 1 % GEL    Apply 4 g topically 3 (three) times daily. Apply 2grams to each knee 3 times daily to reduce pain re: OA   FERROUS SULFATE 325 (65 FE) MG TABLET    Take 325 mg by mouth daily with breakfast.   FLUOCINONIDE CREAM (LIDEX) 0.05 %    Apply 1 application topically 2 (two) times daily. Apply to hands BID as needed   FLUOCINONIDE-EMOLLIENT (LIDEX-E) 0.05 % CREAM    Apply 1 application topically 2 (two) times daily.   INSULIN GLARGINE (LANTUS SOLOSTAR) 100 UNIT/ML SOLOSTAR PEN    Inject 10 Units into the skin at bedtime.   LATANOPROST (XALATAN) 0.005 % OPHTHALMIC SOLUTION    Place 1 drop into the right eye at bedtime.   LORATADINE (CLARITIN) 10 MG TABLET    Take 10 mg by mouth daily as needed for allergies.   MEMANTINE HCL ER (NAMENDA XR) 21 MG CP24    Take by mouth. Take one daily   METFORMIN (GLUCOPHAGE) 500 MG TABLET    Take 1 tablet (500 mg total) by mouth daily with breakfast.   METOPROLOL (TOPROL-XL) 50 MG 24 HR TABLET    Take 50 mg by mouth daily.     MULTIPLE VITAMINS-MINERALS (ICAPS MV) TABS    Take by mouth.     OMEGA-3 FATTY ACIDS (FISH OIL) 1000 MG CAPS    Take by mouth. Take one each morning   OMEPRAZOLE (PRILOSEC) 20 MG CAPSULE    Take 20 mg by mouth 2 (two) times daily.    PAROXETINE (PAXIL) 20 MG TABLET    Take 20 mg by mouth every morning.   POLYVINYL ALCOHOL (LIQUIFILM TEARS) 1.4 % OPHTHALMIC SOLUTION    Place 1 drop into both eyes. One drop four times daily as needed   SENNA (SENOKOT) 8.6 MG TABLET    Take 1 tablet by mouth daily.   Modified Medications   No medications on file  Discontinued Medications   ATORVASTATIN (LIPITOR) 10 MG TABLET    Take 10 mg by mouth daily.     Physical Exam:  Filed Vitals:   01/18/14 1318  BP: 121/66  Pulse: 70  Temp: 98.3 F (36.8 C)  Resp: 20    SpO2: 96%   GENERAL APPEARANCE: No acute distress, appropriately groomed, normal body habitus Alert, pleasant,  Conversation difficult due to hearing deficit SKIN: No diaphoresis, rash, wound HEAD: Normocephalic, atraumatic EYES: Conjunctiva/lids clear   RESPIRATORY: Breathing is even, unlabored  Lung sounds are clear and full   CARDIOVASCULAR: Heart RRR  No murmur or extra heart sounds                         EDEMA: No peripheral edema   ABD: positive BS, non-tender or not distended    PSYCHIATRIC: Mood and affect appropriate to situation    Labs reviewed: Basic Metabolic Panel:  Recent Labs  09/17/13 12/14/13 12/23/13  NA 138 138 139  K 4.9 4.4 4.4  BUN 35* 25* 19  CREATININE 1.6* 1.4* 1.1   Liver Function Tests:  Recent Labs  09/17/13  AST 13*  ALT 9*  ALKPHOS 48   No results found for this basename: LIPASE, AMYLASE,  in the last 8760 hours No results found for this basename: AMMONIA,  in the last 8760 hours CBC:  Recent Labs  12/14/13 12/16/13 12/23/13  WBC 11.2 10.8 10.7  HGB 7.2* 7.0* 8.1*  HCT 22* 22* 25*  PLT 345 526* 461*   Cardiac Enzymes: No results found for this basename: CKTOTAL, CKMB, CKMBINDEX, TROPONINI,  in the last 8760 hours BNP: No components found with this basename: POCBNP,  CBG: No results found for this basename: GLUCAP,  in the last 8760 hours TSH:  Recent Labs  09/17/13  TSH 2.51   A1C: Lab Results  Component Value Date   HGBA1C 6.4* 09/17/2013   Lipid Panel:  Recent Labs  09/17/13  CHOL 150  HDL 57  LDLCALC 72  TRIG 107     Assessment/Plan 1. Type II or unspecified type diabetes mellitus with peripheral circulatory disorders, not stated as uncontrolled(250.70) -fasting blood sugars ranging from 140-180s, pt with decrease PO intake, no hypoglycemic events.  -will cont to monitor may need reduction in insulin if intake does not increase   2. HYPERTENSION -controlled on toprol   3. Alzheimer  disease -progressively getting worse, requiring more assistance due to lethargy  -conts on namenda  4. Unspecified constipation -worse, will increase senna to 2 tablets qhs  5. Osteoarthritis of both knees, unspecified osteoarthritis type -following with ortho and therapy however due to lethargy unable to participate, does not complain of pain today  6. ANEMIA-IRON DEFICIENCY -currently on iron will follow up cbc  7. Lethargy -apparently this has been progressively getting worse over months but with increase in symptoms over the past 2 weeks. Therapy attempting to work with pt but unable due to increase in lethargy, no overt reason for this,  will get blood work today- cbc with diff, cmp, tsh -will titrate and stop paxil-  medication reviewed and does not appear to be contributing to this but will stop anxiety medication because he has not showed any signs of anxiety

## 2014-01-19 DIAGNOSIS — M6281 Muscle weakness (generalized): Secondary | ICD-10-CM | POA: Diagnosis not present

## 2014-01-19 DIAGNOSIS — F0281 Dementia in other diseases classified elsewhere with behavioral disturbance: Secondary | ICD-10-CM | POA: Diagnosis not present

## 2014-01-19 DIAGNOSIS — H547 Unspecified visual loss: Secondary | ICD-10-CM | POA: Diagnosis not present

## 2014-01-19 DIAGNOSIS — M25569 Pain in unspecified knee: Secondary | ICD-10-CM | POA: Diagnosis not present

## 2014-01-19 DIAGNOSIS — F02818 Dementia in other diseases classified elsewhere, unspecified severity, with other behavioral disturbance: Secondary | ICD-10-CM | POA: Diagnosis not present

## 2014-01-20 DIAGNOSIS — M25569 Pain in unspecified knee: Secondary | ICD-10-CM | POA: Diagnosis not present

## 2014-01-20 DIAGNOSIS — H547 Unspecified visual loss: Secondary | ICD-10-CM | POA: Diagnosis not present

## 2014-01-20 DIAGNOSIS — M6281 Muscle weakness (generalized): Secondary | ICD-10-CM | POA: Diagnosis not present

## 2014-01-20 DIAGNOSIS — F02818 Dementia in other diseases classified elsewhere, unspecified severity, with other behavioral disturbance: Secondary | ICD-10-CM | POA: Diagnosis not present

## 2014-01-20 DIAGNOSIS — F0281 Dementia in other diseases classified elsewhere with behavioral disturbance: Secondary | ICD-10-CM | POA: Diagnosis not present

## 2014-01-24 DIAGNOSIS — M25569 Pain in unspecified knee: Secondary | ICD-10-CM | POA: Diagnosis not present

## 2014-01-24 DIAGNOSIS — M6281 Muscle weakness (generalized): Secondary | ICD-10-CM | POA: Diagnosis not present

## 2014-01-24 DIAGNOSIS — H547 Unspecified visual loss: Secondary | ICD-10-CM | POA: Diagnosis not present

## 2014-01-24 DIAGNOSIS — F0281 Dementia in other diseases classified elsewhere with behavioral disturbance: Secondary | ICD-10-CM | POA: Diagnosis not present

## 2014-01-24 DIAGNOSIS — F02818 Dementia in other diseases classified elsewhere, unspecified severity, with other behavioral disturbance: Secondary | ICD-10-CM | POA: Diagnosis not present

## 2014-01-25 DIAGNOSIS — I10 Essential (primary) hypertension: Secondary | ICD-10-CM | POA: Diagnosis not present

## 2014-01-25 DIAGNOSIS — E1159 Type 2 diabetes mellitus with other circulatory complications: Secondary | ICD-10-CM | POA: Diagnosis not present

## 2014-01-25 LAB — CBC AND DIFFERENTIAL
HCT: 24 % — AB (ref 41–53)
HEMOGLOBIN: 7.8 g/dL — AB (ref 13.5–17.5)
Platelets: 407 10*3/uL — AB (ref 150–399)
WBC: 7.6 10*3/mL

## 2014-01-25 LAB — HEMOGLOBIN A1C: HEMOGLOBIN A1C: 6.9 % — AB (ref 4.0–6.0)

## 2014-01-25 LAB — BASIC METABOLIC PANEL
BUN: 19 mg/dL (ref 4–21)
Creatinine: 1.2 mg/dL (ref 0.6–1.3)
Glucose: 110 mg/dL
POTASSIUM: 4.1 mmol/L (ref 3.4–5.3)
Sodium: 144 mmol/L (ref 137–147)

## 2014-01-26 DIAGNOSIS — M6281 Muscle weakness (generalized): Secondary | ICD-10-CM | POA: Diagnosis not present

## 2014-01-26 DIAGNOSIS — H547 Unspecified visual loss: Secondary | ICD-10-CM | POA: Diagnosis not present

## 2014-01-26 DIAGNOSIS — F02818 Dementia in other diseases classified elsewhere, unspecified severity, with other behavioral disturbance: Secondary | ICD-10-CM | POA: Diagnosis not present

## 2014-01-26 DIAGNOSIS — M25569 Pain in unspecified knee: Secondary | ICD-10-CM | POA: Diagnosis not present

## 2014-01-26 DIAGNOSIS — F0281 Dementia in other diseases classified elsewhere with behavioral disturbance: Secondary | ICD-10-CM | POA: Diagnosis not present

## 2014-01-27 DIAGNOSIS — H547 Unspecified visual loss: Secondary | ICD-10-CM | POA: Diagnosis not present

## 2014-01-27 DIAGNOSIS — F0281 Dementia in other diseases classified elsewhere with behavioral disturbance: Secondary | ICD-10-CM | POA: Diagnosis not present

## 2014-01-27 DIAGNOSIS — F02818 Dementia in other diseases classified elsewhere, unspecified severity, with other behavioral disturbance: Secondary | ICD-10-CM | POA: Diagnosis not present

## 2014-01-27 DIAGNOSIS — M25569 Pain in unspecified knee: Secondary | ICD-10-CM | POA: Diagnosis not present

## 2014-01-27 DIAGNOSIS — M6281 Muscle weakness (generalized): Secondary | ICD-10-CM | POA: Diagnosis not present

## 2014-03-08 ENCOUNTER — Non-Acute Institutional Stay (SKILLED_NURSING_FACILITY): Payer: Medicare Other | Admitting: Nurse Practitioner

## 2014-03-08 DIAGNOSIS — H60399 Other infective otitis externa, unspecified ear: Secondary | ICD-10-CM

## 2014-03-08 DIAGNOSIS — I10 Essential (primary) hypertension: Secondary | ICD-10-CM | POA: Diagnosis not present

## 2014-03-08 DIAGNOSIS — H60393 Other infective otitis externa, bilateral: Secondary | ICD-10-CM

## 2014-03-08 DIAGNOSIS — E1159 Type 2 diabetes mellitus with other circulatory complications: Secondary | ICD-10-CM

## 2014-03-08 DIAGNOSIS — K5900 Constipation, unspecified: Secondary | ICD-10-CM

## 2014-03-08 DIAGNOSIS — D649 Anemia, unspecified: Secondary | ICD-10-CM

## 2014-03-08 NOTE — Progress Notes (Signed)
Patient ID: [name], male   DOB: 1922-06-19, 80 y.o.   MRN: [id number]    Nursing Home Location:  [place] of Service: SNF (31)  PCP: Estill Dooms, MD  Allergies  Allergen Reactions  . Aspirin     Chief Complaint  Patient presents with  . Medical Management of Chronic Issues  . Ear Drainage    HPI:  HPI: This is a 80 y.o. male resident of [place], [facility]   section.  pt is being seen today for routine management of chronic conditions. Staff reports bilaterally ear drainage that started last week. Recently worse on right side vs left. Pt with severe hearing loss and unable to give good ROS, denies pain Pt has weight loss but staff reports good appetite and eating well.  Staff reports pt not as lethargic with routine naps  Review of Systems:  Review of Systems  Constitutional: Positive for weight loss. Negative for fever, chills and malaise/fatigue.  HENT: Positive for ear discharge and hearing loss. Negative for congestion, ear pain and tinnitus.   Respiratory: Negative for cough and shortness of breath.   Cardiovascular: Negative for chest pain, palpitations and leg swelling.  Gastrointestinal: Negative for diarrhea and constipation.       Incontinent of bowel and bladder   Genitourinary: Negative for dysuria, urgency and frequency.  Musculoskeletal: Negative for myalgias.  Skin: Negative.   Neurological: Negative for dizziness, weakness and headaches.  Psychiatric/Behavioral: Positive for memory loss.      Past Medical History  Diagnosis Date  . Anemia 2012  . Arthritis   . Diabetes mellitus 1997  . Hyperlipidemia   . Hypertension 2008  . Actinic keratosis   . Macular degeneration (senile) of retina, unspecified   . Epilepsy 1943  . Unspecified venous (peripheral) insufficiency   . Glaucoma   . H/O prostate cancer 2011    treated with radiation  . Major depressive disorder, single episode,  unspecified   . Other B-complex deficiencies 2012  . Unspecified vitamin D deficiency 2010  . Reflux esophagitis 2002  . Generalized osteoarthrosis, unspecified site   . Personal history of colonic polyps 2012    adenomatous  . Coronary atherosclerosis of native coronary artery 2002    60% stenois of the 1st diagonal of the LAD  . Type II or unspecified type diabetes mellitus with peripheral circulatory disorders, not stated as uncontrolled(250.70) 05/24/2009    Qualifier: Diagnosis of  By: Caryl Comes, MD, Remus Blake   . Alzheimer disease 09/13/2008    Qualifier: Diagnosis of  By: Nils Pyle CMA ([place]), Mearl Latin    . History of fall 2013  . Deaf   . 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 Gastric ulcer 2012  . Osteoarthritis of knee 10/07/2013    Qualifier: Diagnosis of  By: Nils Pyle CMA Deborra Medina), Mearl Latin     Past Surgical History  Procedure Laterality Date  . Appendectomy  1947  . Colonoscopy  10/22/10    multiple adenomatous polyps  . Tonsillectomy  1930  . Carpal tunnel release Bilateral 2005  . Myringotomy Right    Social History:   reports that he quit smoking about 50 years ago. His smoking use included Cigarettes. He smoked 0.00 packs per day. He does not have any smokeless tobacco history on file. He reports that he drinks about 7 ounces of alcohol per week. He reports that he does not use  illicit drugs.  Family History  Problem Relation Age of Onset  . Heart disease Mother   . Dementia Mother     Medications: Patient's Medications  New Prescriptions   No medications on file  Previous Medications   ACETAMINOPHEN (TYLENOL) 325 MG TABLET    Take 650 mg by mouth 3 (three) times daily.    CHOLECALCIFEROL (VITAMIN D3) 1000 UNITS CAPS    Take by mouth.    CYANOCOBALAMIN (VITAMIN B 12 PO)    Take 1,000 mcg by mouth daily.   DEXTROMETHORPHAN (DELSYM) 30 MG/5ML LIQUID    Take by mouth. 1 tsp every 12 hours as needed for cough   DICLOFENAC  SODIUM (VOLTAREN) 1 % GEL    Apply 4 g topically 3 (three) times daily. Apply 2grams to each knee 3 times daily to reduce pain re: OA   FERROUS SULFATE 325 (65 FE) MG TABLET    Take 325 mg by mouth daily with breakfast.   FLUOCINONIDE CREAM (LIDEX) 0.05 %    Apply 1 application topically 2 (two) times daily. Apply to hands BID as needed   FLUOCINONIDE-EMOLLIENT (LIDEX-E) 0.05 % CREAM    Apply 1 application topically 2 (two) times daily.   INSULIN GLARGINE (LANTUS SOLOSTAR) 100 UNIT/ML SOLOSTAR PEN    Inject 10 Units into the skin at bedtime.   LATANOPROST (XALATAN) 0.005 % OPHTHALMIC SOLUTION    Place 1 drop into the right eye at bedtime.   LORATADINE (CLARITIN) 10 MG TABLET    Take 10 mg by mouth daily as needed for allergies.   MEMANTINE HCL ER (NAMENDA XR) 21 MG CP24    Take by mouth. Take one daily   METFORMIN (GLUCOPHAGE) 500 MG TABLET    Take 1 tablet (500 mg total) by mouth daily with breakfast.   METOPROLOL (TOPROL-XL) 50 MG 24 HR TABLET    Take 50 mg by mouth daily.     MULTIPLE VITAMINS-MINERALS (ICAPS MV) TABS    Take by mouth.     OMEGA-3 FATTY ACIDS (FISH OIL) 1000 MG CAPS    Take by mouth. Take one each morning   OMEPRAZOLE (PRILOSEC) 20 MG CAPSULE    Take 20 mg by mouth 2 (two) times daily.    PAROXETINE (PAXIL) 20 MG TABLET    Take 20 mg by mouth every morning.   POLYVINYL ALCOHOL (LIQUIFILM TEARS) 1.4 % OPHTHALMIC SOLUTION    Place 1 drop into both eyes. One drop four times daily as needed   SENNA (SENOKOT) 8.6 MG TABLET    Take 1 tablet by mouth daily.   Modified Medications   No medications on file  Discontinued Medications   No medications on file     Physical Exam:  Filed Vitals:   03/08/14 1651  BP: 130/58  Pulse: 76  Temp: 98.8 F (37.1 C)  Resp: 20  Weight: 184 lb (83.462 kg)   Physical Exam  Constitutional: He is well-developed, well-nourished, and in no distress.  HENT:  Right Ear: There is drainage (yellow, green drainage). No swelling or tenderness.    Left Ear: There is drainage (min drainage). No swelling or tenderness.  Mouth/Throat: Oropharynx is clear and moist. No oropharyngeal exudate.  Eyes: Conjunctivae and EOM are normal. Pupils are equal, round, and reactive to light.  Cardiovascular: Normal rate, regular rhythm and normal heart sounds.   Pulmonary/Chest: Effort normal and breath sounds normal. No respiratory distress. He has no wheezes.  Abdominal: Soft. Bowel sounds are normal. He exhibits no  distension.  Musculoskeletal: He exhibits no edema and no tenderness.  Neurological: He is alert.  Skin: Skin is warm and dry.  Psychiatric: Affect normal.    Labs reviewed: Basic Metabolic Panel:  Recent Labs  12/14/13 12/23/13 01/25/14  NA 138 139 144  K 4.4 4.4 4.1  BUN 25* 19 19  CREATININE 1.4* 1.1 1.2   Liver Function Tests:  Recent Labs  09/17/13  AST 13*  ALT 9*  ALKPHOS 48   No results found for this basename: LIPASE, AMYLASE,  in the last 8760 hours No results found for this basename: AMMONIA,  in the last 8760 hours CBC:  Recent Labs  12/16/13 12/23/13 01/25/14  WBC 10.8 10.7 7.6  HGB 7.0* 8.1* 7.8*  HCT 22* 25* 24*  PLT 526* 461* 407*   Cardiac Enzymes: No results found for this basename: CKTOTAL, CKMB, CKMBINDEX, TROPONINI,  in the last 8760 hours BNP: No components found with this basename: POCBNP,  CBG: No results found for this basename: GLUCAP,  in the last 8760 hours TSH:  Recent Labs  09/17/13  TSH 2.51   A1C: Lab Results  Component Value Date   HGBA1C 6.9* 01/25/2014   Lipid Panel:  Recent Labs  09/17/13  CHOL 150  HDL 57  LDLCALC 72  TRIG 107     Assessment/Plan 1. Unspecified constipation Stable on current medications  2. Type II or unspecified type diabetes mellitus with peripheral circulatory disorders, not stated as uncontrolled(250.70) -diabetes is controlled on current medications  3. HYPERTENSION -Patients hypertension is stable; continue current regimen. Will  monitor and make changes as necessary.  4. ANEMIA -stable, conts iron  5. Otitis, externa, infective, bilateral -right worse than left  Will start ofloxacin drops 0.3% 10 drops into both ears daily for 7 days

## 2014-04-04 ENCOUNTER — Encounter: Payer: Self-pay | Admitting: Internal Medicine

## 2014-04-07 DIAGNOSIS — M109 Gout, unspecified: Secondary | ICD-10-CM | POA: Diagnosis not present

## 2014-04-08 ENCOUNTER — Encounter: Payer: Self-pay | Admitting: Internal Medicine

## 2014-04-26 DIAGNOSIS — Z23 Encounter for immunization: Secondary | ICD-10-CM | POA: Diagnosis not present

## 2014-05-10 ENCOUNTER — Non-Acute Institutional Stay (SKILLED_NURSING_FACILITY): Payer: Medicare Other | Admitting: Nurse Practitioner

## 2014-05-10 ENCOUNTER — Encounter: Payer: Self-pay | Admitting: Nurse Practitioner

## 2014-05-10 DIAGNOSIS — M170 Bilateral primary osteoarthritis of knee: Secondary | ICD-10-CM | POA: Diagnosis not present

## 2014-05-10 DIAGNOSIS — G309 Alzheimer's disease, unspecified: Secondary | ICD-10-CM

## 2014-05-10 DIAGNOSIS — F0280 Dementia in other diseases classified elsewhere without behavioral disturbance: Secondary | ICD-10-CM

## 2014-05-10 DIAGNOSIS — E118 Type 2 diabetes mellitus with unspecified complications: Secondary | ICD-10-CM

## 2014-05-10 DIAGNOSIS — E538 Deficiency of other specified B group vitamins: Secondary | ICD-10-CM

## 2014-05-10 DIAGNOSIS — I10 Essential (primary) hypertension: Secondary | ICD-10-CM

## 2014-05-10 DIAGNOSIS — D649 Anemia, unspecified: Secondary | ICD-10-CM

## 2014-05-10 NOTE — Progress Notes (Signed)
Patient ID: [name], male   DOB: 1922-04-23, 80 y.o.   MRN: [id number]    Nursing Home Location:  [place] of Service: SNF (31)  PCP: Estill Dooms, MD  Allergies  Allergen Reactions  . Aspirin   . Nsaids     Chief Complaint  Patient presents with  . Medical Management of Chronic Issues    HPI:  This is a 80 y.o. male resident of [place], Memory Care section. pt is being seen today for routine management of chronic conditions. There has been no acute concerns in the last month. Pt remains at baseline without decline in cognitive or functional status. Eating well at this time.   Review of Systems:  Review of Systems  Constitutional: Negative for activity change, appetite change and unexpected weight change.  HENT: Positive for hearing loss. Negative for congestion, drooling, sore throat and tinnitus.   Eyes: Negative.   Respiratory: Negative for cough and shortness of breath.   Cardiovascular: Negative for chest pain, palpitations and leg swelling.  Gastrointestinal: Negative for nausea, diarrhea and constipation.       Heartburn  Endocrine: Negative.   Genitourinary: Negative for urgency, frequency and difficulty urinating.  Musculoskeletal: Positive for gait problem (unstable on standing. Needs assistance to walk.uses wheelchair). Negative for arthralgias and back pain.  Skin: Negative for pallor, rash and wound.       Dry skin.  Allergic/Immunologic: Negative.   Neurological: Negative for dizziness, tremors, syncope and weakness.       Alzheimer's disease. Ataxic.  Psychiatric/Behavioral: Positive for confusion and agitation (at times).    Past Medical History  Diagnosis Date  . Anemia 2012  . Arthritis   . Diabetes mellitus 1997  . Hyperlipidemia   . Hypertension 2008  . Actinic keratosis   . Macular degeneration (senile) of retina, unspecified   . Epilepsy 1943  . Unspecified venous (peripheral)  insufficiency   . Glaucoma   . H/O prostate cancer 2011    treated with radiation  . Major depressive disorder, single episode, unspecified   . Other B-complex deficiencies 2012  . Unspecified vitamin D deficiency 2010  . Reflux esophagitis 2002  . Generalized osteoarthrosis, unspecified site   . Personal history of colonic polyps 2012    adenomatous  . Coronary atherosclerosis of native coronary artery 2002    60% stenois of the 1st diagonal of the LAD  . Type II or unspecified type diabetes mellitus with peripheral circulatory disorders, not stated as uncontrolled(250.70) 05/24/2009    Qualifier: Diagnosis of  By: Caryl Comes, MD, Remus Blake   . Alzheimer disease 09/13/2008    Qualifier: Diagnosis of  By: Nils Pyle CMA (Lu[place] Llorens Torres), Mearl Latin    . History of fall 2013  . Deaf   . 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 Gastric ulcer 2012  . Osteoarthritis of knee 10/07/2013    Qualifier: Diagnosis of  By: Nils Pyle CMA Deborra Medina), Mearl Latin     Past Surgical History  Procedure Laterality Date  . Appendectomy  1947  . Colonoscopy  10/22/10    multiple adenomatous polyps  . Tonsillectomy  1930  . Carpal tunnel release Bilateral 2005  . Myringotomy Right    Social History:   reports that he quit smoking about 50 years ago. His smoking use included Cigarettes. He smoked 0.00 packs per day. He does not have any smokeless tobacco history on file. He reports  that he does not drink alcohol or use illicit drugs.  Family History  Problem Relation Age of Onset  . Heart disease Mother   . Dementia Mother     Medications: Patient's Medications  New Prescriptions   No medications on file  Previous Medications   ACETAMINOPHEN (TYLENOL) 325 MG TABLET    Take 650 mg by mouth 3 (three) times daily.    CHOLECALCIFEROL (VITAMIN D3) 1000 UNITS CAPS    Take by mouth. Take one daily   CYANOCOBALAMIN (VITAMIN B 12 PO)    Take 1,000 mcg by mouth daily.    DEXTROMETHORPHAN (DELSYM) 30 MG/5ML LIQUID    Take by mouth. 1 tsp every 12 hours as needed for cough   DICLOFENAC SODIUM (VOLTAREN) 1 % GEL    Apply 4 g topically 3 (three) times daily. Apply 2grams to each knee 3 times daily to reduce pain re: OA   FERROUS SULFATE 325 (65 FE) MG TABLET    Take 325 mg by mouth daily with breakfast.   FLUOCINONIDE CREAM (LIDEX) 0.05 %    Apply 1 application topically 2 (two) times daily. Apply to hands BID as needed   INSULIN GLARGINE (LANTUS SOLOSTAR) 100 UNIT/ML SOLOSTAR PEN    Inject 10 Units into the skin at bedtime.   LATANOPROST (XALATAN) 0.005 % OPHTHALMIC SOLUTION    Place 1 drop into the right eye at bedtime.   LORATADINE (CLARITIN) 10 MG TABLET    Take 10 mg by mouth daily as needed for allergies.   MEMANTINE HCL ER (NAMENDA XR) 21 MG CP24    Take by mouth. Take one daily   METOPROLOL (TOPROL-XL) 50 MG 24 HR TABLET    Take 50 mg by mouth daily.     MULTIPLE VITAMINS-MINERALS (ICAPS MV) TABS    Take by mouth. Take one daily for eyes   OMEPRAZOLE (PRILOSEC) 20 MG CAPSULE    Take 20 mg by mouth 2 (two) times daily.    POLYETHYLENE GLYCOL (MIRALAX / GLYCOLAX) PACKET    Take 17 g by mouth daily.   POLYVINYL ALCOHOL (LIQUIFILM TEARS) 1.4 % OPHTHALMIC SOLUTION    Place 1 drop into both eyes. One drop four times daily as needed   SENNA (SENOKOT) 8.6 MG TABLET    Take 1 tablet by mouth daily.    TRIAMCINOLONE CREAM (KENALOG) 0.1 %    Apply 1 application topically. Apply thin layer to arms, legs and back as needed  Modified Medications   No medications on file  Discontinued Medications   METFORMIN (GLUCOPHAGE) 500 MG TABLET    Take 1 tablet (500 mg total) by mouth daily with breakfast.     Physical Exam: Filed Vitals:   05/10/14 1529  BP: 134/73  Pulse: 73  Temp: 97 F (36.1 C)  Resp: 20  Weight: 189 lb (85.73 kg)    Physical Exam  Constitutional: He appears well-developed and well-nourished. No distress.  HENT:  Head: Normocephalic and  atraumatic.  Mouth/Throat: Oropharynx is clear and moist. No oropharyngeal exudate.  Eyes: Conjunctivae and EOM are normal. Pupils are equal, round, and reactive to light.  Neck: Normal range of motion. Neck supple.  Cardiovascular: Normal rate, regular rhythm and normal heart sounds.   Pulmonary/Chest: Effort normal and breath sounds normal.  Abdominal: Soft. Bowel sounds are normal.  Musculoskeletal: He exhibits no edema and no tenderness.  Neurological: He is alert.  Skin: Skin is warm and dry. He is not diaphoretic.  Psychiatric: He has a normal  mood and affect.    Labs reviewed: Basic Metabolic Panel:  Recent Labs  12/14/13 12/23/13 01/25/14  NA 138 139 144  K 4.4 4.4 4.1  BUN 25* 19 19  CREATININE 1.4* 1.1 1.2   Liver Function Tests:  Recent Labs  09/17/13  AST 13*  ALT 9*  ALKPHOS 48   No results found for this basename: LIPASE, AMYLASE,  in the last 8760 hours No results found for this basename: AMMONIA,  in the last 8760 hours CBC:  Recent Labs  12/16/13 12/23/13 01/25/14  WBC 10.8 10.7 7.6  HGB 7.0* 8.1* 7.8*  HCT 22* 25* 24*  PLT 526* 461* 407*   TSH:  Recent Labs  09/17/13  TSH 2.51   A1C: Lab Results  Component Value Date   HGBA1C 6.9* 01/25/2014   Lipid Panel:  Recent Labs  09/17/13  CHOL 150  HDL 57  LDLCALC 72  TRIG 107     Assessment/Plan 1. Type 2 diabetes mellitus with complication -metformin recently stopped, conts lantus qhs, will follow up A1c  2. Essential hypertension -conts on metoprolol, blood pressure controlled.   3. Alzheimer disease -doing well in current environment, has some agitation and behaviors at times. Staff able to redirect at this time.   4. Osteoarthritis of both knees, unspecified osteoarthritis type Denies pain, conts on voltaren gel.   5. Anemia, unspecified anemia type -conts on iron, will follow up cbc   6. Vitamin B12 deficiency -will get b12 level

## 2014-05-12 DIAGNOSIS — D649 Anemia, unspecified: Secondary | ICD-10-CM | POA: Diagnosis not present

## 2014-05-12 DIAGNOSIS — E1165 Type 2 diabetes mellitus with hyperglycemia: Secondary | ICD-10-CM | POA: Diagnosis not present

## 2014-05-12 DIAGNOSIS — G309 Alzheimer's disease, unspecified: Secondary | ICD-10-CM | POA: Diagnosis not present

## 2014-05-12 DIAGNOSIS — D519 Vitamin B12 deficiency anemia, unspecified: Secondary | ICD-10-CM | POA: Diagnosis not present

## 2014-05-12 LAB — BASIC METABOLIC PANEL
BUN: 31 mg/dL — AB (ref 4–21)
Creatinine: 1.4 mg/dL — AB (ref 0.6–1.3)
GLUCOSE: 83 mg/dL
Potassium: 4.6 mmol/L (ref 3.4–5.3)
Sodium: 141 mmol/L (ref 137–147)

## 2014-05-12 LAB — CBC AND DIFFERENTIAL
HEMATOCRIT: 30 % — AB (ref 41–53)
HEMOGLOBIN: 10.2 g/dL — AB (ref 13.5–17.5)
Platelets: 262 10*3/uL (ref 150–399)
WBC: 7.2 10^3/mL

## 2014-05-12 LAB — HEMOGLOBIN A1C: Hgb A1c MFr Bld: 7.1 % — AB (ref 4.0–6.0)

## 2014-06-23 ENCOUNTER — Encounter: Payer: Self-pay | Admitting: Adult Health

## 2014-06-23 ENCOUNTER — Non-Acute Institutional Stay (SKILLED_NURSING_FACILITY): Payer: Medicare Other | Admitting: Adult Health

## 2014-06-23 DIAGNOSIS — G309 Alzheimer's disease, unspecified: Secondary | ICD-10-CM

## 2014-06-23 DIAGNOSIS — I10 Essential (primary) hypertension: Secondary | ICD-10-CM | POA: Diagnosis not present

## 2014-06-23 DIAGNOSIS — D509 Iron deficiency anemia, unspecified: Secondary | ICD-10-CM

## 2014-06-23 DIAGNOSIS — K219 Gastro-esophageal reflux disease without esophagitis: Secondary | ICD-10-CM | POA: Diagnosis not present

## 2014-06-23 DIAGNOSIS — E118 Type 2 diabetes mellitus with unspecified complications: Secondary | ICD-10-CM

## 2014-06-23 DIAGNOSIS — E538 Deficiency of other specified B group vitamins: Secondary | ICD-10-CM

## 2014-06-23 DIAGNOSIS — F0280 Dementia in other diseases classified elsewhere without behavioral disturbance: Secondary | ICD-10-CM

## 2014-06-23 NOTE — Progress Notes (Signed)
Patient ID: [name], male   DOB: [birth year], 80 y.o.   MRN: [id number]  Nursing Home Location:  [facility]   Code Status: DNR   Place of Service:  [facility]  Chief Complaint  Patient presents with  . Medical Management of Chronic Issues    HPI:  80 y.o. male  residing at [facility], memory care section. I am here to review his chronic medical issues.  He has a hx of CAD, AD, prostate ca, anemia, HLD, OA, and DM Type 2. VS have been stable over the past month, although his BP today was slightly low. His weight has increased by 5 lbs in the past month and the nurse reports that the resident has a good appetite and even eats off other resident's plates. He has no complaints today and there are no complaints regarding his care. He was started on Paxil on 06/20/14 for sexually aggressive behavior. This was not an issue per the staff today.  Review of Systems:  Review of Systems  Constitutional: Negative for fever, chills, diaphoresis, activity change, appetite change and fatigue.  HENT: Negative for congestion, sinus pressure, sore throat and trouble swallowing.        HOH  Respiratory: Negative for cough and shortness of breath.   Cardiovascular: Negative for chest pain, palpitations and leg swelling.  Gastrointestinal: Negative for abdominal pain, constipation and abdominal distention.  Endocrine: Negative for polydipsia and polyphagia.  Genitourinary: Negative for dysuria and difficulty urinating.       Incontinent  Musculoskeletal: Positive for arthralgias and gait problem.  Neurological: Negative for tremors, seizures, syncope, facial asymmetry, speech difficulty and light-headedness.  Psychiatric/Behavioral: Positive for behavioral problems and confusion. Negative for agitation.       Sexually aggressive behavior    Medications: Patient's Medications  New Prescriptions   No medications on file  Previous Medications   ACETAMINOPHEN (TYLENOL) 325 MG TABLET    Take 650 mg by mouth 3 (three) times daily.    CHOLECALCIFEROL (VITAMIN D3) 1000 UNITS CAPS    Take by mouth. Take one daily   CYANOCOBALAMIN (VITAMIN B 12 PO)    Take 1,000 mcg by mouth daily.   DEXTROMETHORPHAN (DELSYM) 30 MG/5ML LIQUID    Take by mouth. 1 tsp every 12 hours as needed for cough   DICLOFENAC SODIUM (VOLTAREN) 1 % GEL    Apply 4 g topically 3 (three) times daily. Apply 2grams to each knee 3 times daily to reduce pain re: OA   FERROUS SULFATE 325 (65 FE) MG TABLET    Take 325 mg by mouth daily with breakfast.   FLUOCINONIDE CREAM (LIDEX) 0.05 %    Apply 1 application topically 2 (two) times daily. Apply to hands BID as needed   INSULIN GLARGINE (LANTUS SOLOSTAR) 100 UNIT/ML SOLOSTAR PEN    Inject 10 Units into the skin at bedtime.   LATANOPROST (XALATAN) 0.005 % OPHTHALMIC SOLUTION    Place 1 drop into the right eye at bedtime.   LORATADINE (CLARITIN) 10 MG TABLET    Take 10 mg by mouth daily as needed for allergies.   MEMANTINE HCL ER (NAMENDA XR) 21 MG CP24    Take by mouth. Take one daily   METOPROLOL (TOPROL-XL) 50 MG 24 HR TABLET    Take 50 mg by mouth daily.     MULTIPLE VITAMINS-MINERALS (ICAPS MV) TABS    Take by mouth. Take one daily for eyes   OMEPRAZOLE (PRILOSEC) 20 MG CAPSULE  Take 20 mg by mouth 2 (two) times daily.    PAROXETINE (PAXIL) 20 MG TABLET    Take 20 mg by mouth daily.   POLYETHYLENE GLYCOL (MIRALAX / GLYCOLAX) PACKET    Take 17 g by mouth daily.   POLYVINYL ALCOHOL (LIQUIFILM TEARS) 1.4 % OPHTHALMIC SOLUTION    Place 1 drop into both eyes. One drop four times daily as needed   SENNA (SENOKOT) 8.6 MG TABLET    Take 1 tablet by mouth daily.    TRIAMCINOLONE CREAM (KENALOG) 0.1 %    Apply 1 application topically. Apply thin layer to arms, legs and back as needed  Modified Medications   No medications on file  Discontinued Medications   No medications on file     Physical Exam:  Filed Vitals:   06/23/14 1140   BP: 95/58  Pulse: 72  Temp: 97.4 F (36.3 C)  Resp: 20  Weight: 190 lb (86.183 kg)    Physical Exam  Constitutional: No distress.  HENT:  Deaf, uses assistive device  Neck: No JVD present. No tracheal deviation present.  Cardiovascular: Normal rate, regular rhythm, normal heart sounds and intact distal pulses.   No murmur heard. Pulmonary/Chest: Effort normal and breath sounds normal. No respiratory distress. He has no wheezes.  Abdominal: Soft. Bowel sounds are normal. He exhibits no distension. There is no tenderness.  Musculoskeletal: He exhibits no edema or tenderness.  Decreased ROM to shoulders, hips, knees  Lymphadenopathy:    He has no cervical adenopathy.  Neurological: He is alert. No cranial nerve deficit.  Oriented x2   Skin: Skin is warm and dry. He is not diaphoretic. No erythema.  Psychiatric: Affect normal.    Labs reviewed/Significant Diagnostic Results:  Basic Metabolic Panel:  Recent Labs  12/23/13 01/25/14 05/12/14  NA 139 144 141  K 4.4 4.1 4.6  BUN 19 19 31*  CREATININE 1.1 1.2 1.4*   Liver Function Tests:  Recent Labs  09/17/13  AST 13*  ALT 9*  ALKPHOS 48   No results for input(s): LIPASE, AMYLASE in the last 8760 hours. No results for input(s): AMMONIA in the last 8760 hours. CBC:  Recent Labs  12/23/13 01/25/14 05/12/14  WBC 10.7 7.6 7.2  HGB 8.1* 7.8* 10.2*  HCT 25* 24* 30*  PLT 461* 407* 262   CBG: No results for input(s): GLUCAP in the last 8760 hours. TSH:  Recent Labs  09/17/13  TSH 2.51   A1C: Lab Results  Component Value Date   HGBA1C 7.1* 05/12/2014   Lipid Panel:  Recent Labs  09/17/13  CHOL 150  HDL 57  LDLCALC 72  TRIG 107   04/07/14: uric acid 6.9 05/12/14: Iron 53, %sat 24, TIBC 219, B12 1043    Assessment/Plan Alzheimer disease Fairly stable MMSE results. Increase Namenda to ideal dose of 28mg . Continue Paxil for sexually aggressive behaviors.  Type 2 diabetes mellitus CBG range 129-145  on Lantus. Glucophage discontinued on 10/9.  Continue current dose of Lantus and monitor A1C.  GERD (gastroesophageal reflux disease) Given his hx of ulcer and heme positive stool back in June of 2015, will continue the Prilosec  Iron deficiency anemia Hgb stable on current iron dose, continue and monitor resident  Essential hypertension BP on the low side today, other numbers WNL. Continue current dose of Toprol and montior BP/pulse.  Vitamin B12 deficiency Replenished, continue current dose.       Cindi Carbon, ANP [facility] [phone]

## 2014-06-28 DIAGNOSIS — K219 Gastro-esophageal reflux disease without esophagitis: Secondary | ICD-10-CM | POA: Insufficient documentation

## 2014-06-28 NOTE — Assessment & Plan Note (Signed)
BP on the low side today, other numbers WNL. Continue current dose of Toprol and montior BP/pulse.

## 2014-06-28 NOTE — Assessment & Plan Note (Signed)
CBG range 129-145 on Lantus. Glucophage discontinued on 10/9.  Continue current dose of Lantus and monitor A1C.

## 2014-06-28 NOTE — Assessment & Plan Note (Signed)
Hgb stable on current iron dose, continue and monitor resident

## 2014-06-28 NOTE — Assessment & Plan Note (Signed)
Given his hx of ulcer and heme positive stool back in June of 2015, will continue the Prilosec

## 2014-06-28 NOTE — Assessment & Plan Note (Addendum)
Fairly stable MMSE results. Increase Namenda to ideal dose of 28mg . Continue Paxil for sexually aggressive behaviors.

## 2014-06-28 NOTE — Assessment & Plan Note (Signed)
Replenished, continue current dose.

## 2014-07-25 ENCOUNTER — Non-Acute Institutional Stay (SKILLED_NURSING_FACILITY): Payer: Medicare Other | Admitting: Adult Health

## 2014-07-25 DIAGNOSIS — K219 Gastro-esophageal reflux disease without esophagitis: Secondary | ICD-10-CM | POA: Diagnosis not present

## 2014-07-25 DIAGNOSIS — E559 Vitamin D deficiency, unspecified: Secondary | ICD-10-CM

## 2014-07-25 DIAGNOSIS — E538 Deficiency of other specified B group vitamins: Secondary | ICD-10-CM

## 2014-07-25 DIAGNOSIS — D509 Iron deficiency anemia, unspecified: Secondary | ICD-10-CM

## 2014-07-25 DIAGNOSIS — F0280 Dementia in other diseases classified elsewhere without behavioral disturbance: Secondary | ICD-10-CM

## 2014-07-25 DIAGNOSIS — G309 Alzheimer's disease, unspecified: Secondary | ICD-10-CM

## 2014-07-25 DIAGNOSIS — K5901 Slow transit constipation: Secondary | ICD-10-CM

## 2014-07-25 DIAGNOSIS — E118 Type 2 diabetes mellitus with unspecified complications: Secondary | ICD-10-CM | POA: Diagnosis not present

## 2014-07-25 DIAGNOSIS — I10 Essential (primary) hypertension: Secondary | ICD-10-CM

## 2014-07-25 NOTE — Assessment & Plan Note (Signed)
Currently on supplementation. No recent Vit D level available. Recheck with next lab draw

## 2014-07-25 NOTE — Progress Notes (Signed)
Patient ID: [name], male   DOB: 1922/02/23, 81 y.o.   MRN: [id number]  Nursing Home Location:  [facility]   Code Status: DNR   Place of Service: SNF ([id number]  BTDHR Complaint  Patient presents with  . Medical Management of Chronic Issues    HPI:  81 y.o. male  residing at [facility], memory care section. I am here to review his chronic medical issues.  He has a hx of CAD, AD, prostate ca, anemia, HLD, OA, and DM Type 2. His BP has been elevated for the past three readings (156/76). His weight has increased over the past month to 196 lbs. The staff reports that he is over eating on a regular basis.  He has no complaints today and there are no complaints regarding his care. He was started on Paxil on 06/20/14 for sexually aggressive behavior. This was not an issue per the staff today.  Review of Systems:  Review of Systems  Constitutional: Negative for fever, chills, diaphoresis, activity change, appetite change and fatigue.  HENT: Negative for congestion, sinus pressure, sore throat and trouble swallowing.        HOH  Respiratory: Negative for cough and shortness of breath.   Cardiovascular: Negative for chest pain, palpitations and leg swelling.  Gastrointestinal: Negative for abdominal pain, constipation and abdominal distention.  Endocrine: Negative for polydipsia and polyphagia.  Genitourinary: Negative for dysuria and difficulty urinating.       Incontinent  Musculoskeletal: Positive for arthralgias and gait problem.  Neurological: Negative for tremors, seizures, syncope, facial asymmetry, speech difficulty and light-headedness.  Psychiatric/Behavioral: Positive for behavioral problems and confusion. Negative for agitation.       H/o sexually inappropriate behavior    Medications: Patient's Medications  New Prescriptions   No medications on file  Previous Medications   ACETAMINOPHEN (TYLENOL) 325 MG TABLET    Take  650 mg by mouth 3 (three) times daily.    CHOLECALCIFEROL (VITAMIN D3) 1000 UNITS CAPS    Take by mouth. Take one daily   CYANOCOBALAMIN (VITAMIN B 12 PO)    Take 1,000 mcg by mouth daily.   DEXTROMETHORPHAN (DELSYM) 30 MG/5ML LIQUID    Take by mouth. 1 tsp every 12 hours as needed for cough   DICLOFENAC SODIUM (VOLTAREN) 1 % GEL    Apply 4 g topically 3 (three) times daily. Apply 2grams to each knee 3 times daily to reduce pain re: OA   FERROUS SULFATE 325 (65 FE) MG TABLET    Take 325 mg by mouth daily with breakfast.   FLUOCINONIDE CREAM (LIDEX) 0.05 %    Apply 1 application topically 2 (two) times daily. Apply to hands BID as needed   INSULIN GLARGINE (LANTUS SOLOSTAR) 100 UNIT/ML SOLOSTAR PEN    Inject 10 Units into the skin at bedtime.   LATANOPROST (XALATAN) 0.005 % OPHTHALMIC SOLUTION    Place 1 drop into the right eye at bedtime.   LORATADINE (CLARITIN) 10 MG TABLET    Take 10 mg by mouth daily as needed for allergies.   MEMANTINE HCL ER (NAMENDA XR) 28 MG CP24    Take by mouth.   METOPROLOL (TOPROL-XL) 50 MG 24 HR TABLET    Take 50 mg by mouth daily.     MULTIPLE VITAMINS-MINERALS (ICAPS MV) TABS    Take by mouth. Take one daily for eyes   OMEPRAZOLE (PRILOSEC) 20 MG CAPSULE    Take 20 mg by mouth 2 (two)  times daily.    PAROXETINE (PAXIL) 20 MG TABLET    Take 20 mg by mouth daily.   POLYETHYLENE GLYCOL (MIRALAX / GLYCOLAX) PACKET    Take 17 g by mouth daily.   POLYVINYL ALCOHOL (LIQUIFILM TEARS) 1.4 % OPHTHALMIC SOLUTION    Place 1 drop into both eyes. One drop four times daily as needed   SENNA (SENOKOT) 8.6 MG TABLET    Take 1 tablet by mouth daily.    TRIAMCINOLONE CREAM (KENALOG) 0.1 %    Apply 1 application topically. Apply thin layer to arms, legs and back as needed  Modified Medications   No medications on file  Discontinued Medications   MEMANTINE HCL ER (NAMENDA XR) 21 MG CP24    Take 28 capsules by mouth. Take one daily     Physical Exam:  Filed Vitals:   07/25/14  1057  BP: 156/76  Pulse: 56  Temp: 96.5 F (35.8 C)  Resp: 20  Weight: 196 lb (88.905 kg)  SpO2: 95%    Physical Exam  Constitutional: No distress.  HENT:  Deaf, uses assistive device  Neck: No JVD present. No tracheal deviation present.  Cardiovascular: Normal rate, regular rhythm, normal heart sounds and intact distal pulses.   No murmur heard. Pulmonary/Chest: Effort normal and breath sounds normal. No respiratory distress. He has no wheezes.  Abdominal: Soft. Bowel sounds are normal. He exhibits no distension. There is no tenderness.  Musculoskeletal: He exhibits no edema or tenderness.  Decreased ROM to shoulders, hips, knees  Lymphadenopathy:    He has no cervical adenopathy.  Neurological: He is alert. No cranial nerve deficit.  Oriented x2   Skin: Skin is warm and dry. He is not diaphoretic. No erythema.  Psychiatric: Affect normal.    Labs reviewed/Significant Diagnostic Results:  Basic Metabolic Panel:  Recent Labs  12/23/13 01/25/14 05/12/14  NA 139 144 141  K 4.4 4.1 4.6  BUN 19 19 31*  CREATININE 1.1 1.2 1.4*   Liver Function Tests:  Recent Labs  09/17/13  AST 13*  ALT 9*  ALKPHOS 48   No results for input(s): LIPASE, AMYLASE in the last 8760 hours. No results for input(s): AMMONIA in the last 8760 hours. CBC:  Recent Labs  12/23/13 01/25/14 05/12/14  WBC 10.7 7.6 7.2  HGB 8.1* 7.8* 10.2*  HCT 25* 24* 30*  PLT 461* 407* 262   CBG: No results for input(s): GLUCAP in the last 8760 hours. TSH:  Recent Labs  09/17/13  TSH 2.51   A1C: Lab Results  Component Value Date   HGBA1C 7.1* 05/12/2014   Lipid Panel:  Recent Labs  09/17/13  CHOL 150  HDL 57  LDLCALC 72  TRIG 107   04/07/14: uric acid 6.9 05/12/14: Iron 53, %sat 24, TIBC 219, B12 1043    Assessment/Plan Essential hypertension Elevated today. Check daily with manual cuff for 5 days and report in notebook. Elevated BUN/Cr in Oct, recheck BMP next  draw  Constipation Stable. Staff reports regular BMs on current regimen  Type 2 diabetes mellitus Stable. CBGs range 116-143 fasting. A1C 7.1% in Oct. Goal is <8% given his age. Continue current dose of Lantus and monitor.  Alzheimer disease Stable. Tolerated increase dose of Namenda. Continue to monitor.  Iron deficiency anemia Currently on ferrous sulfate. Last H/H 10.2/29.9 in Oct. No signs of bleeding. Check CBC next draw.  Vitamin B12 deficiency He is on chronic PPI so I believe that his why he was started on B12.  Last MCV 89.3 in Oct. B12 level 1043. Stable, continue current dose.  GERD (gastroesophageal reflux disease) Stable. Continue prilosec. NO reports of bleeding. H/O heme pos stool and ulcer  Vitamin D deficiency Currently on supplementation. No recent Vit D level available. Recheck with next lab draw   Check TSH due to weight gain  Cindi Carbon, [place] [phone]

## 2014-07-25 NOTE — Assessment & Plan Note (Signed)
Stable. CBGs range 116-143 fasting. A1C 7.1% in Oct. Goal is <8% given his age. Continue current dose of Lantus and monitor.

## 2014-07-25 NOTE — Assessment & Plan Note (Signed)
Stable. Continue prilosec. NO reports of bleeding. H/O heme pos stool and ulcer

## 2014-07-25 NOTE — Assessment & Plan Note (Signed)
Stable. Staff reports regular BMs on current regimen

## 2014-07-25 NOTE — Assessment & Plan Note (Signed)
He is on chronic PPI so I believe that his why he was started on B12. Last MCV 89.3 in Oct. B12 level 1043. Stable, continue current dose.

## 2014-07-25 NOTE — Progress Notes (Signed)
Patient ID: [name], male   DOB: [birth year], 81 y.o.   MRN: [id number] He was started on Paxil for sexually inappropriate behavior in November. He has continued to gain weight. We will monitor his weights and diet and consult with psychiatry if this continues. The paxil was started by Dr. Casimiro Needle

## 2014-07-25 NOTE — Assessment & Plan Note (Addendum)
Elevated today. Check daily with manual cuff for 5 days and report in notebook. Elevated BUN/Cr in Oct, recheck BMP next draw

## 2014-07-25 NOTE — Assessment & Plan Note (Signed)
Currently on ferrous sulfate. Last H/H 10.2/29.9 in Oct. No signs of bleeding. Check CBC next draw.

## 2014-07-25 NOTE — Assessment & Plan Note (Signed)
Stable. Tolerated increase dose of Namenda. Continue to monitor.

## 2014-07-26 DIAGNOSIS — D509 Iron deficiency anemia, unspecified: Secondary | ICD-10-CM | POA: Diagnosis not present

## 2014-07-26 DIAGNOSIS — E559 Vitamin D deficiency, unspecified: Secondary | ICD-10-CM | POA: Diagnosis not present

## 2014-07-26 DIAGNOSIS — I10 Essential (primary) hypertension: Secondary | ICD-10-CM | POA: Diagnosis not present

## 2014-07-27 LAB — BASIC METABOLIC PANEL
BUN: 26 mg/dL — AB (ref 4–21)
Creatinine: 1.3 mg/dL (ref 0.6–1.3)
GLUCOSE: 99 mg/dL
POTASSIUM: 4.6 mmol/L (ref 3.4–5.3)
Sodium: 141 mmol/L (ref 137–147)

## 2014-07-27 LAB — HEMOGLOBIN A1C: Hgb A1c MFr Bld: 7.1 % — AB (ref 4.0–6.0)

## 2014-07-27 LAB — CBC AND DIFFERENTIAL
HEMATOCRIT: 31 % — AB (ref 41–53)
HEMOGLOBIN: 10.3 g/dL — AB (ref 13.5–17.5)
Platelets: 243 10*3/uL (ref 150–399)
WBC: 6.7 10^3/mL

## 2014-07-27 LAB — TSH: TSH: 2.41 u[IU]/mL (ref 0.41–5.90)

## 2014-08-09 DIAGNOSIS — H6092 Unspecified otitis externa, left ear: Secondary | ICD-10-CM | POA: Diagnosis not present

## 2014-08-09 DIAGNOSIS — H6121 Impacted cerumen, right ear: Secondary | ICD-10-CM | POA: Diagnosis not present

## 2014-09-01 ENCOUNTER — Non-Acute Institutional Stay (SKILLED_NURSING_FACILITY): Payer: Medicare Other | Admitting: Adult Health

## 2014-09-01 ENCOUNTER — Encounter: Payer: Self-pay | Admitting: Adult Health

## 2014-09-01 DIAGNOSIS — K219 Gastro-esophageal reflux disease without esophagitis: Secondary | ICD-10-CM | POA: Diagnosis not present

## 2014-09-01 DIAGNOSIS — G309 Alzheimer's disease, unspecified: Secondary | ICD-10-CM

## 2014-09-01 DIAGNOSIS — E118 Type 2 diabetes mellitus with unspecified complications: Secondary | ICD-10-CM

## 2014-09-01 DIAGNOSIS — E559 Vitamin D deficiency, unspecified: Secondary | ICD-10-CM

## 2014-09-01 DIAGNOSIS — R635 Abnormal weight gain: Secondary | ICD-10-CM | POA: Insufficient documentation

## 2014-09-01 DIAGNOSIS — M170 Bilateral primary osteoarthritis of knee: Secondary | ICD-10-CM

## 2014-09-01 DIAGNOSIS — F0280 Dementia in other diseases classified elsewhere without behavioral disturbance: Secondary | ICD-10-CM

## 2014-09-01 DIAGNOSIS — I10 Essential (primary) hypertension: Secondary | ICD-10-CM | POA: Diagnosis not present

## 2014-09-01 DIAGNOSIS — D509 Iron deficiency anemia, unspecified: Secondary | ICD-10-CM

## 2014-09-01 NOTE — Progress Notes (Signed)
Patient ID: HAN VEJAR, male   DOB: 14-Feb-1922, 81 y.o.   MRN: [id number]    Nursing Home Location:  [facility]   Code Status: DNR   Place of Service: [id number]  [facility] Complaint  Patient presents with  . Medical Management of Chronic Issues    HPI:  81 y.o. male  residing at [facility], memory care section. I am here to review his chronic medical issues.  He has a hx of CAD, AD, prostate ca, anemia, HLD, OA, and DM Type 2.. His weight has increased over the past few months to 197.8 lbs. The staff reports that he is over eating on a regular basis and forgetting that he has eaten and taking food from other people's trays.  He was started on Paxil on 06/20/14 for sexually aggressive behavior. This was not an issue per the staff today. The weight gain started prior to the Paxil.   Review of Systems:  Review of Systems  Constitutional: Negative for fever, chills, diaphoresis, activity change, appetite change and fatigue.  HENT: Negative for congestion, sinus pressure, sore throat and trouble swallowing.        HOH  Respiratory: Negative for cough and shortness of breath.   Cardiovascular: Negative for chest pain, palpitations and leg swelling.  Gastrointestinal: Negative for abdominal pain, constipation and abdominal distention.  Endocrine: Negative for polydipsia and polyphagia.  Genitourinary: Negative for dysuria and difficulty urinating.       Incontinent  Musculoskeletal: Positive for arthralgias and gait problem.  Neurological: Negative for tremors, seizures, syncope, facial asymmetry, speech difficulty and light-headedness.  Psychiatric/Behavioral: Positive for behavioral problems and confusion. Negative for agitation.       H/o sexually inappropriate behavior    Medications: Patient's Medications  New Prescriptions   No medications on file  Previous Medications   ACETAMINOPHEN (TYLENOL) 325 MG TABLET    Take 650 mg by  mouth 3 (three) times daily.    CHOLECALCIFEROL (VITAMIN D) 2000 UNITS CAPS    Take by mouth.   CYANOCOBALAMIN (VITAMIN B 12 PO)    Take 1,000 mcg by mouth daily.   DEXTROMETHORPHAN (DELSYM) 30 MG/5ML LIQUID    Take by mouth. 1 tsp every 12 hours as needed for cough   DICLOFENAC SODIUM (VOLTAREN) 1 % GEL    Apply 4 g topically 3 (three) times daily. Apply 2grams to each knee 3 times daily to reduce pain re: OA   FERROUS SULFATE 325 (65 FE) MG TABLET    Take 325 mg by mouth daily with breakfast.   FLUOCINONIDE CREAM (LIDEX) 0.05 %    Apply 1 application topically 2 (two) times daily. Apply to hands BID as needed   INSULIN GLARGINE (LANTUS SOLOSTAR) 100 UNIT/ML SOLOSTAR PEN    Inject 10 Units into the skin at bedtime.   LATANOPROST (XALATAN) 0.005 % OPHTHALMIC SOLUTION    Place 1 drop into the right eye at bedtime.   LORATADINE (CLARITIN) 10 MG TABLET    Take 10 mg by mouth daily as needed for allergies.   MEMANTINE HCL ER (NAMENDA XR) 28 MG CP24    Take by mouth.   METOPROLOL (TOPROL-XL) 50 MG 24 HR TABLET    Take 50 mg by mouth daily.     MULTIPLE VITAMINS-MINERALS (ICAPS MV) TABS    Take by mouth. Take one daily for eyes   OMEPRAZOLE (PRILOSEC) 20 MG CAPSULE    Take 20 mg by mouth 2 (two) times daily.  PAROXETINE (PAXIL) 20 MG TABLET    Take 20 mg by mouth daily.   POLYETHYLENE GLYCOL (MIRALAX / GLYCOLAX) PACKET    Take 17 g by mouth daily.   POLYVINYL ALCOHOL (LIQUIFILM TEARS) 1.4 % OPHTHALMIC SOLUTION    Place 1 drop into both eyes. One drop four times daily as needed   SENNA (SENOKOT) 8.6 MG TABLET    Take 1 tablet by mouth daily.    TRIAMCINOLONE CREAM (KENALOG) 0.1 %    Apply 1 application topically. Apply thin layer to arms, legs and back as needed  Modified Medications   No medications on file  Discontinued Medications   CHOLECALCIFEROL (VITAMIN D3) 1000 UNITS CAPS    Take by mouth. Take one daily     Physical Exam:  Filed Vitals:   09/01/14 1041  BP: 127/66  Pulse: 62    Temp: 98.6 F (37 C)  Resp: 18  Weight: 197 lb 12.8 oz (89.721 kg)  SpO2: 96%    Physical Exam  Constitutional: No distress.  HENT:  Deaf, uses assistive device  Neck: No JVD present. No tracheal deviation present.  Cardiovascular: Normal rate, regular rhythm, normal heart sounds and intact distal pulses.   No murmur heard. Pulmonary/Chest: Effort normal and breath sounds normal. No respiratory distress. He has no wheezes.  Abdominal: Soft. Bowel sounds are normal. He exhibits no distension. There is no tenderness.  Musculoskeletal: He exhibits no edema or tenderness.  Decreased ROM to shoulders, hips, knees  Lymphadenopathy:    He has no cervical adenopathy.  Neurological: He is alert. No cranial nerve deficit.  Oriented x2   Skin: Skin is warm and dry. He is not diaphoretic. No erythema.  Psychiatric: Affect normal.    Labs reviewed/Significant Diagnostic Results:  Basic Metabolic Panel:  Recent Labs  01/25/14 05/12/14 07/27/14  NA 144 141 141  K 4.1 4.6 4.6  BUN 19 31* 26*  CREATININE 1.2 1.4* 1.3   Liver Function Tests:  Recent Labs  09/17/13  AST 13*  ALT 9*  ALKPHOS 48   No results for input(s): LIPASE, AMYLASE in the last 8760 hours. No results for input(s): AMMONIA in the last 8760 hours. CBC:  Recent Labs  01/25/14 05/12/14 07/27/14  WBC 7.6 7.2 6.7  HGB 7.8* 10.2* 10.3*  HCT 24* 30* 31*  PLT 407* 262 243   CBG: No results for input(s): GLUCAP in the last 8760 hours. TSH:  Recent Labs  09/17/13 07/27/14  TSH 2.51 2.41   A1C: Lab Results  Component Value Date   HGBA1C 7.1* 07/27/2014   Lipid Panel:  Recent Labs  09/17/13  CHOL 150  HDL 57  LDLCALC 72  TRIG 107   04/07/14: uric acid 6.9 05/12/14: Iron 53, %sat 24, TIBC 219, B12 1043 07/27/2014: Vit D 24   Assessment/Plan  1. Alzheimer disease Stable on Namenda. Non ambulatory but still able to feed himself and answer questions. May be receiving benefit so will continue  current therapy.  2. Type 2 diabetes mellitus with complication Controlled on Lantus, last A1C 7.1  3. Iron deficiency anemia Stable on iron, h/o GI bleed.   4. Vitamin D deficiency Vit D increased to 2000 units due to sub optimal levels. Continue current dose and monitor levels at regular intervals  5. Osteoarthritis of both knees, unspecified osteoarthritis type Pain controlled with scheduled tylenol  6. Essential hypertension Controlled on metoprolol. BMP WNL  7. Weight gain Uncontrolled. Continues to gain weight. TSH WNL. He is already  on a heart healthy diet and the staff redirects him when he attempts to overeat. This issue is partially related to his dementia and certainly the paxil could a contributing factor. However, I did note that the weight gain started prior to the paxil admin.  Will monitor for now and hope his weight levels off.   8. Gastroesophageal reflux disease without esophagitis Controlled with prilosec, has hx of ulcer.   Cindi Carbon, ANP [facility] [phone]

## 2014-09-11 ENCOUNTER — Emergency Department (HOSPITAL_COMMUNITY)
Admission: EM | Admit: 2014-09-11 | Discharge: 2014-09-11 | Disposition: A | Discharge status: Home / Self Care | Payer: Medicare Other | Attending: Emergency Medicine | Admitting: Emergency Medicine

## 2014-09-11 DIAGNOSIS — Z8546 Personal history of malignant neoplasm of prostate: Secondary | ICD-10-CM | POA: Diagnosis not present

## 2014-09-11 DIAGNOSIS — E538 Deficiency of other specified B group vitamins: Secondary | ICD-10-CM | POA: Diagnosis not present

## 2014-09-11 DIAGNOSIS — Y998 Other external cause status: Secondary | ICD-10-CM | POA: Insufficient documentation

## 2014-09-11 DIAGNOSIS — M1710 Unilateral primary osteoarthritis, unspecified knee: Secondary | ICD-10-CM | POA: Diagnosis not present

## 2014-09-11 DIAGNOSIS — H409 Unspecified glaucoma: Secondary | ICD-10-CM | POA: Insufficient documentation

## 2014-09-11 DIAGNOSIS — E119 Type 2 diabetes mellitus without complications: Secondary | ICD-10-CM | POA: Insufficient documentation

## 2014-09-11 DIAGNOSIS — Y92128 Other place in nursing home as the place of occurrence of the external cause: Secondary | ICD-10-CM | POA: Diagnosis not present

## 2014-09-11 DIAGNOSIS — I10 Essential (primary) hypertension: Secondary | ICD-10-CM | POA: Diagnosis not present

## 2014-09-11 DIAGNOSIS — G309 Alzheimer's disease, unspecified: Secondary | ICD-10-CM | POA: Diagnosis not present

## 2014-09-11 DIAGNOSIS — S51012A Laceration without foreign body of left elbow, initial encounter: Secondary | ICD-10-CM

## 2014-09-11 DIAGNOSIS — S59909A Unspecified injury of unspecified elbow, initial encounter: Secondary | ICD-10-CM | POA: Diagnosis not present

## 2014-09-11 DIAGNOSIS — E559 Vitamin D deficiency, unspecified: Secondary | ICD-10-CM | POA: Insufficient documentation

## 2014-09-11 DIAGNOSIS — Z872 Personal history of diseases of the skin and subcutaneous tissue: Secondary | ICD-10-CM | POA: Insufficient documentation

## 2014-09-11 DIAGNOSIS — Z87891 Personal history of nicotine dependence: Secondary | ICD-10-CM | POA: Diagnosis not present

## 2014-09-11 DIAGNOSIS — D649 Anemia, unspecified: Secondary | ICD-10-CM | POA: Diagnosis not present

## 2014-09-11 DIAGNOSIS — H9190 Unspecified hearing loss, unspecified ear: Secondary | ICD-10-CM | POA: Diagnosis not present

## 2014-09-11 DIAGNOSIS — E785 Hyperlipidemia, unspecified: Secondary | ICD-10-CM | POA: Insufficient documentation

## 2014-09-11 DIAGNOSIS — Z791 Long term (current) use of non-steroidal anti-inflammatories (NSAID): Secondary | ICD-10-CM | POA: Diagnosis not present

## 2014-09-11 DIAGNOSIS — I2510 Atherosclerotic heart disease of native coronary artery without angina pectoris: Secondary | ICD-10-CM | POA: Insufficient documentation

## 2014-09-11 DIAGNOSIS — Y9389 Activity, other specified: Secondary | ICD-10-CM | POA: Insufficient documentation

## 2014-09-11 DIAGNOSIS — Z9181 History of falling: Secondary | ICD-10-CM | POA: Insufficient documentation

## 2014-09-11 DIAGNOSIS — M1990 Unspecified osteoarthritis, unspecified site: Secondary | ICD-10-CM | POA: Insufficient documentation

## 2014-09-11 DIAGNOSIS — R259 Unspecified abnormal involuntary movements: Secondary | ICD-10-CM | POA: Diagnosis not present

## 2014-09-11 DIAGNOSIS — F329 Major depressive disorder, single episode, unspecified: Secondary | ICD-10-CM | POA: Insufficient documentation

## 2014-09-11 DIAGNOSIS — Z79899 Other long term (current) drug therapy: Secondary | ICD-10-CM | POA: Diagnosis not present

## 2014-09-11 DIAGNOSIS — Z86010 Personal history of colonic polyps: Secondary | ICD-10-CM | POA: Diagnosis not present

## 2014-09-11 DIAGNOSIS — M79602 Pain in left arm: Secondary | ICD-10-CM | POA: Diagnosis not present

## 2014-09-11 DIAGNOSIS — K210 Gastro-esophageal reflux disease with esophagitis: Secondary | ICD-10-CM | POA: Insufficient documentation

## 2014-09-11 DIAGNOSIS — W050XXA Fall from non-moving wheelchair, initial encounter: Secondary | ICD-10-CM | POA: Diagnosis not present

## 2014-09-11 DIAGNOSIS — Z794 Long term (current) use of insulin: Secondary | ICD-10-CM | POA: Insufficient documentation

## 2014-09-11 NOTE — ED Notes (Signed)
Pt from [facility] unit via EMS. Per EMS, pt had unwitnessed fall after being found on floor in front of wheelchair. Pt has large skin tear inside of L elbow which was dressed by staff. Pt denies pain upon palpating neck or back. EMS noted no other injuries. Pt has hx of advanced dementia. Pt at baseline per staff.  Pt in NAD.

## 2014-09-11 NOTE — ED Notes (Signed)
[facility] HERE FOR TRANSPORT BACK TO [facility] [place]

## 2014-09-11 NOTE — ED Notes (Signed)
DNR YELLOW COPY WITH PTAR

## 2014-09-11 NOTE — Discharge Instructions (Signed)
Please go to your Primary Care Physician, an Urgent Care or return to the Emergency Department to have your staples or sutures removed 7 -10 days from today.

## 2014-09-11 NOTE — ED Notes (Signed)
MD at bedside. 

## 2014-09-11 NOTE — ED Notes (Signed)
Bed: UN27 Expected date: 09/11/14 Expected time: 9:31 AM Means of arrival: Ambulance Comments: EMS

## 2014-09-11 NOTE — ED Provider Notes (Signed)
CSN: [id number]     Arrival date & time 09/11/14  5400 History   First MD Initiated Contact with Patient 09/11/14 [phone]     Chief Complaint  Patient presents with  . Fall  . Skin tear      (Consider location/radiation/quality/duration/timing/severity/associated sxs/prior Treatment) HPI Comments: Patient here after having a witnessed fall according to nursing home records from a wheelchair. Head did not strike the ground. Did sustain a laceration which appears 3 x 5 cm to his left inner elbow. It was dressed at the nursing home and he was transported here for further evaluation. No other injuries noted  Patient is a 81 y.o. male presenting with fall. The history is provided by medical records. The history is limited by the condition of the patient.  Fall    Past Medical History  Diagnosis Date  . Anemia 2012  . Arthritis   . Diabetes mellitus 1997  . Hyperlipidemia   . Hypertension 2008  . Actinic keratosis   . Macular degeneration (senile) of retina, unspecified   . Epilepsy 1943  . Unspecified venous (peripheral) insufficiency   . Glaucoma   . H/O prostate cancer 2011    treated with radiation  . Major depressive disorder, single episode, unspecified   . Other B-complex deficiencies 2012  . Unspecified vitamin D deficiency 2010  . Reflux esophagitis 2002  . Generalized osteoarthrosis, unspecified site   . Personal history of colonic polyps 2012    adenomatous  . Coronary atherosclerosis of native coronary artery 2002    60% stenois of the 1st diagonal of the LAD  . Type II or unspecified type diabetes mellitus with peripheral circulatory disorders, not stated as uncontrolled(250.70) 05/24/2009    Qualifier: Diagnosis of  By: Caryl Comes, MD, Remus Blake   . Alzheimer disease 09/13/2008    Qualifier: Diagnosis of  By: Nils Pyle CMA ([place]), Mearl Latin    . History of fall 2013  . Deaf   . 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  Gastric ulcer 2012  . Osteoarthritis of knee 10/07/2013    Qualifier: Diagnosis of  By: Nils Pyle CMA Deborra Medina), Mearl Latin     Past Surgical History  Procedure Laterality Date  . Appendectomy  1947  . Colonoscopy  10/22/10    multiple adenomatous polyps  . Tonsillectomy  1930  . Carpal tunnel release Bilateral 2005  . Myringotomy Right    Family History  Problem Relation Age of Onset  . Heart disease Mother   . Dementia Mother    History  Substance Use Topics  . Smoking status: Former Smoker    Types: Cigarettes    Quit date: 10/22/1963  . Smokeless tobacco: Not on file  . Alcohol Use: No    Review of Systems  Unable to perform ROS     Allergies  Aspirin and Nsaids  Home Medications   Prior to Admission medications   Medication Sig Start Date End Date Taking? Authorizing Provider  acetaminophen (TYLENOL) 325 MG tablet Take 650 mg by mouth 3 (three) times daily.  10/19/13   [name], NP  Cholecalciferol (VITAMIN D) 2000 UNITS CAPS Take by mouth.    Historical Provider, MD  Cyanocobalamin (VITAMIN B 12 PO) Take 1,000 mcg by mouth daily.    Historical Provider, MD  dextromethorphan (DELSYM) 30 MG/5ML liquid Take by mouth. 1 tsp every 12 hours as needed for cough    Historical Provider, MD  diclofenac sodium (  VOLTAREN) 1 % GEL Apply 4 g topically 3 (three) times daily. Apply 2grams to each knee 3 times daily to reduce pain re: OA 11/09/13   [name], NP  ferrous sulfate 325 (65 FE) MG tablet Take 325 mg by mouth daily with breakfast. 12/10/13   [name], NP  fluocinonide cream (LIDEX) 0.09 % Apply 1 application topically 2 (two) times daily. Apply to hands BID as needed    Historical Provider, MD  Insulin Glargine (LANTUS SOLOSTAR) 100 UNIT/ML Solostar Pen Inject 10 Units into the skin at bedtime. 12/23/13   Claudette Jeri Cos, NP  latanoprost (XALATAN) 0.005 % ophthalmic solution Place 1 drop into the right eye at bedtime.    Historical Provider, MD  loratadine  (CLARITIN) 10 MG tablet Take 10 mg by mouth daily as needed for allergies.    Historical Provider, MD  Memantine HCl ER (NAMENDA XR) 28 MG CP24 Take by mouth.    Historical Provider, MD  metoprolol (TOPROL-XL) 50 MG 24 hr tablet Take 50 mg by mouth daily.      Historical Provider, MD  Multiple Vitamins-Minerals (ICAPS MV) TABS Take by mouth. Take one daily for eyes    Historical Provider, MD  omeprazole (PRILOSEC) 20 MG capsule Take 20 mg by mouth 2 (two) times daily.     Historical Provider, MD  PARoxetine (PAXIL) 20 MG tablet Take 20 mg by mouth daily.    Historical Provider, MD  polyethylene glycol (MIRALAX / GLYCOLAX) packet Take 17 g by mouth daily.    Historical Provider, MD  polyvinyl alcohol (LIQUIFILM TEARS) 1.4 % ophthalmic solution Place 1 drop into both eyes. One drop four times daily as needed    Historical Provider, MD  senna (SENOKOT) 8.6 MG tablet Take 1 tablet by mouth daily.     Historical Provider, MD  triamcinolone cream (KENALOG) 0.1 % Apply 1 application topically. Apply thin layer to arms, legs and back as needed    Historical Provider, MD   BP 141/68 mmHg  Pulse 71  Temp(Src) 97.9 F (36.6 C) (Oral)  Resp 20  SpO2 95% Physical Exam  Constitutional: He is oriented to person, place, and time. He appears well-developed and well-nourished.  Non-toxic appearance. No distress.  HENT:  Head: Normocephalic and atraumatic.  Eyes: Conjunctivae, EOM and lids are normal. Pupils are equal, round, and reactive to light.  Neck: Normal range of motion. Neck supple. No tracheal deviation present. No thyroid mass present.  Cardiovascular: Normal rate, regular rhythm and normal heart sounds.  Exam reveals no gallop.   No murmur heard. Pulmonary/Chest: Effort normal and breath sounds normal. No stridor. No respiratory distress. He has no decreased breath sounds. He has no wheezes. He has no rhonchi. He has no rales.  Abdominal: Soft. Normal appearance and bowel sounds are normal. He  exhibits no distension. There is no tenderness. There is no rebound and no CVA tenderness.  Musculoskeletal: Normal range of motion. He exhibits no edema or tenderness.       Arms: Neurological: He is alert and oriented to person, place, and time. He has normal strength. No cranial nerve deficit or sensory deficit. GCS eye subscore is 4. GCS verbal subscore is 5. GCS motor subscore is 6.  Skin: Skin is warm and dry. No abrasion and no rash noted.  Psychiatric: He has a normal mood and affect. His speech is normal and behavior is normal.  Nursing note and vitals reviewed.   ED Course  Procedures (including  critical care time) Labs Review Labs Reviewed - No data to display  Imaging Review No results found.   EKG Interpretation None      MDM   Final diagnoses:  None    LACERATION REPAIR Performed by: Leota Jacobsen Authorized by: Leota Jacobsen Consent: Verbal consent obtained. Risks and benefits: risks, benefits and alternatives were discussed Consent given by: patient Patient identity confirmed: provided demographic data Prepped and Draped in normal sterile fashion Wound explored  Laceration Location: left innerelbow  Laceration Length: 5cm  No Foreign Bodies seen or palpated  Anesthesia: local infiltration    Irrigation method: syringe Amount of cleaning: standard  Skin closure: 7 staples    Technique:simple  Patient tolerance: Patient tolerated the procedure well with no immediate complications.  Wound irrigated and stapled as above. Follow up instructions written on patient's discharge chart   Leota Jacobsen, MD 09/11/14 1017

## 2014-09-12 ENCOUNTER — Encounter: Payer: Self-pay | Admitting: Adult Health

## 2014-09-12 ENCOUNTER — Non-Acute Institutional Stay (SKILLED_NURSING_FACILITY): Payer: Medicare Other | Admitting: Adult Health

## 2014-09-12 DIAGNOSIS — R05 Cough: Secondary | ICD-10-CM | POA: Diagnosis not present

## 2014-09-12 DIAGNOSIS — S41112A Laceration without foreign body of left upper arm, initial encounter: Secondary | ICD-10-CM | POA: Diagnosis not present

## 2014-09-12 DIAGNOSIS — R509 Fever, unspecified: Secondary | ICD-10-CM | POA: Diagnosis not present

## 2014-09-12 DIAGNOSIS — R296 Repeated falls: Secondary | ICD-10-CM | POA: Diagnosis not present

## 2014-09-12 LAB — BASIC METABOLIC PANEL
BUN: 28 mg/dL — AB (ref 4–21)
Creatinine: 1.5 mg/dL — AB (ref 0.6–1.3)
Glucose: 284 mg/dL
Potassium: 4.8 mmol/L (ref 3.4–5.3)
Sodium: 138 mmol/L (ref 137–147)

## 2014-09-12 LAB — CBC AND DIFFERENTIAL
Hemoglobin: 9.9 g/dL — AB (ref 13.5–17.5)
Platelets: 224 10*3/uL (ref 150–399)
WBC: 8.7 10^3/mL

## 2014-09-12 LAB — HEPATIC FUNCTION PANEL
ALK PHOS: 51 U/L (ref 25–125)
ALT: 14 U/L (ref 10–40)
AST: 22 U/L (ref 14–40)
Bilirubin, Total: 0.4 mg/dL

## 2014-09-12 NOTE — Progress Notes (Signed)
Patient ID: [name], male   DOB: 21-Oct-1921, 81 y.o.   MRN: [id number]    Nursing Home Location:  [facility]   Code Status: DNR   Place of Service: SNF [id number]  DUKGU Complaint  Patient presents with  . Acute Visit    f/u laceration    HPI:  81 y.o. male  residing at [facility], memory care section..  He has a hx of CAD, AD, prostate ca, anemia, HLD, OA, and DM Type 2. He was sent to the ER over the weekend due to a fall out of the wheelchair which caused a laceration to the left arm. He was given 7 staples. The nurse reports that he as issues with sliding out of the WC at times. He did not hit his head or sustained any other significant injury per the notes.  Review of Systems:  Review of Systems  Constitutional: Negative for fever, chills, diaphoresis, activity change, appetite change and fatigue.  HENT: Negative for congestion, sinus pressure, sore throat and trouble swallowing.        HOH  Respiratory: Negative for cough and shortness of breath.   Cardiovascular: Negative for chest pain, palpitations and leg swelling.  Gastrointestinal: Negative for abdominal pain, constipation and abdominal distention.  Endocrine: Negative for polydipsia and polyphagia.  Genitourinary: Negative for dysuria and difficulty urinating.       Incontinent  Musculoskeletal: Positive for arthralgias and gait problem.  Skin: Positive for wound.  Neurological: Negative for tremors, seizures, syncope, facial asymmetry, speech difficulty and light-headedness.  Psychiatric/Behavioral: Positive for behavioral problems and confusion. Negative for agitation.       H/o sexually inappropriate behavior    Medications: Patient's Medications  New Prescriptions   No medications on file  Previous Medications   ACETAMINOPHEN (TYLENOL) 325 MG TABLET    Take 650 mg by mouth 3 (three) times daily.    CHOLECALCIFEROL (VITAMIN D) 2000 UNITS CAPS    Take by  mouth.   CYANOCOBALAMIN (VITAMIN B 12 PO)    Take 1,000 mcg by mouth daily.   DEXTROMETHORPHAN (DELSYM) 30 MG/5ML LIQUID    Take by mouth. 1 tsp every 12 hours as needed for cough   DICLOFENAC SODIUM (VOLTAREN) 1 % GEL    Apply 4 g topically 3 (three) times daily. Apply 2grams to each knee 3 times daily to reduce pain re: OA   FERROUS SULFATE 325 (65 FE) MG TABLET    Take 325 mg by mouth daily with breakfast.   FLUOCINONIDE CREAM (LIDEX) 0.05 %    Apply 1 application topically 2 (two) times daily. Apply to hands BID as needed   INSULIN GLARGINE (LANTUS SOLOSTAR) 100 UNIT/ML SOLOSTAR PEN    Inject 10 Units into the skin at bedtime.   LATANOPROST (XALATAN) 0.005 % OPHTHALMIC SOLUTION    Place 1 drop into the right eye at bedtime.   LORATADINE (CLARITIN) 10 MG TABLET    Take 10 mg by mouth daily as needed for allergies.   MEMANTINE HCL ER (NAMENDA XR) 28 MG CP24    Take by mouth.   METOPROLOL (TOPROL-XL) 50 MG 24 HR TABLET    Take 50 mg by mouth daily.     MULTIPLE VITAMINS-MINERALS (ICAPS MV) TABS    Take by mouth. Take one daily for eyes   OMEPRAZOLE (PRILOSEC) 20 MG CAPSULE    Take 20 mg by mouth 2 (two) times daily.    PAROXETINE (PAXIL) 20 MG TABLET  Take 20 mg by mouth daily.   POLYETHYLENE GLYCOL (MIRALAX / GLYCOLAX) PACKET    Take 17 g by mouth daily.   POLYVINYL ALCOHOL (LIQUIFILM TEARS) 1.4 % OPHTHALMIC SOLUTION    Place 1 drop into both eyes. One drop four times daily as needed   SENNA (SENOKOT) 8.6 MG TABLET    Take 1 tablet by mouth daily.    TRIAMCINOLONE CREAM (KENALOG) 0.1 %    Apply 1 application topically. Apply thin layer to arms, legs and back as needed  Modified Medications   No medications on file  Discontinued Medications   No medications on file     Physical Exam:  Filed Vitals:   09/12/14 1036  BP: 115/69  Pulse: 82  Temp: 96.2 F (35.7 C)  Resp: 20  SpO2: 95%    Physical Exam  Constitutional: No distress.  HENT:  Deaf, uses assistive device  Neck: No  JVD present. No tracheal deviation present.  Cardiovascular: Normal rate, regular rhythm, normal heart sounds and intact distal pulses.   No murmur heard. Pulmonary/Chest: Effort normal and breath sounds normal. No respiratory distress. He has no wheezes.  Abdominal: Soft. Bowel sounds are normal. He exhibits no distension. There is no tenderness.  Musculoskeletal: He exhibits no edema or tenderness.  Decreased ROM to shoulders, hips, knees  Lymphadenopathy:    He has no cervical adenopathy.  Neurological: He is alert. No cranial nerve deficit.  Oriented x2   Skin: Skin is warm and dry. He is not diaphoretic. No erythema.  7 staples noted to the left A/C area. Approximated, no drainage or erythema. Some tenderness when dressing removed.   Psychiatric: Affect normal.    Labs reviewed/Significant Diagnostic Results:  Basic Metabolic Panel:  Recent Labs  01/25/14 05/12/14 07/27/14  NA 144 141 141  K 4.1 4.6 4.6  BUN 19 31* 26*  CREATININE 1.2 1.4* 1.3   Liver Function Tests:  Recent Labs  09/17/13  AST 13*  ALT 9*  ALKPHOS 48   No results for input(s): LIPASE, AMYLASE in the last 8760 hours. No results for input(s): AMMONIA in the last 8760 hours. CBC:  Recent Labs  01/25/14 05/12/14 07/27/14  WBC 7.6 7.2 6.7  HGB 7.8* 10.2* 10.3*  HCT 24* 30* 31*  PLT 407* 262 243   CBG: No results for input(s): GLUCAP in the last 8760 hours. TSH:  Recent Labs  09/17/13 07/27/14  TSH 2.51 2.41   A1C: Lab Results  Component Value Date   HGBA1C 7.1* 07/27/2014   Lipid Panel:  Recent Labs  09/17/13  CHOL 150  HDL 57  LDLCALC 72  TRIG 107   04/07/14: uric acid 6.9 05/12/14: Iron 53, %sat 24, TIBC 219, B12 1043 07/27/2014: Vit D 24   Assessment/Plan  1) Left arm laceration Maintain staples and remove in 7-10 days. Will recheck next week. Monitor for s/s of infection including drainage, erythema, warmth, or fever.   2) Recurrent falls Consult with therapy regarding  techniques to prevent sliding out of the [place], [place] [phone]

## 2014-09-13 DIAGNOSIS — R05 Cough: Secondary | ICD-10-CM | POA: Diagnosis not present

## 2014-09-14 DIAGNOSIS — M6281 Muscle weakness (generalized): Secondary | ICD-10-CM | POA: Diagnosis not present

## 2014-09-14 DIAGNOSIS — G309 Alzheimer's disease, unspecified: Secondary | ICD-10-CM | POA: Diagnosis not present

## 2014-09-14 DIAGNOSIS — R1312 Dysphagia, oropharyngeal phase: Secondary | ICD-10-CM | POA: Diagnosis not present

## 2014-09-14 DIAGNOSIS — R296 Repeated falls: Secondary | ICD-10-CM | POA: Diagnosis not present

## 2014-09-14 DIAGNOSIS — R278 Other lack of coordination: Secondary | ICD-10-CM | POA: Diagnosis not present

## 2014-09-15 DIAGNOSIS — G309 Alzheimer's disease, unspecified: Secondary | ICD-10-CM | POA: Diagnosis not present

## 2014-09-15 DIAGNOSIS — R278 Other lack of coordination: Secondary | ICD-10-CM | POA: Diagnosis not present

## 2014-09-15 DIAGNOSIS — I10 Essential (primary) hypertension: Secondary | ICD-10-CM | POA: Diagnosis not present

## 2014-09-15 DIAGNOSIS — J209 Acute bronchitis, unspecified: Secondary | ICD-10-CM | POA: Diagnosis not present

## 2014-09-15 DIAGNOSIS — R1312 Dysphagia, oropharyngeal phase: Secondary | ICD-10-CM | POA: Diagnosis not present

## 2014-09-15 DIAGNOSIS — M6281 Muscle weakness (generalized): Secondary | ICD-10-CM | POA: Diagnosis not present

## 2014-09-15 DIAGNOSIS — R296 Repeated falls: Secondary | ICD-10-CM | POA: Diagnosis not present

## 2014-09-16 DIAGNOSIS — G309 Alzheimer's disease, unspecified: Secondary | ICD-10-CM | POA: Diagnosis not present

## 2014-09-16 DIAGNOSIS — R1312 Dysphagia, oropharyngeal phase: Secondary | ICD-10-CM | POA: Diagnosis not present

## 2014-09-16 DIAGNOSIS — M6281 Muscle weakness (generalized): Secondary | ICD-10-CM | POA: Diagnosis not present

## 2014-09-16 DIAGNOSIS — R278 Other lack of coordination: Secondary | ICD-10-CM | POA: Diagnosis not present

## 2014-09-16 DIAGNOSIS — R296 Repeated falls: Secondary | ICD-10-CM | POA: Diagnosis not present

## 2014-09-17 DIAGNOSIS — M6281 Muscle weakness (generalized): Secondary | ICD-10-CM | POA: Diagnosis not present

## 2014-09-17 DIAGNOSIS — R278 Other lack of coordination: Secondary | ICD-10-CM | POA: Diagnosis not present

## 2014-09-17 DIAGNOSIS — R1312 Dysphagia, oropharyngeal phase: Secondary | ICD-10-CM | POA: Diagnosis not present

## 2014-09-17 DIAGNOSIS — G309 Alzheimer's disease, unspecified: Secondary | ICD-10-CM | POA: Diagnosis not present

## 2014-09-17 DIAGNOSIS — R296 Repeated falls: Secondary | ICD-10-CM | POA: Diagnosis not present

## 2014-09-18 DIAGNOSIS — G309 Alzheimer's disease, unspecified: Secondary | ICD-10-CM | POA: Diagnosis not present

## 2014-09-18 DIAGNOSIS — M6281 Muscle weakness (generalized): Secondary | ICD-10-CM | POA: Diagnosis not present

## 2014-09-18 DIAGNOSIS — R278 Other lack of coordination: Secondary | ICD-10-CM | POA: Diagnosis not present

## 2014-09-18 DIAGNOSIS — R1312 Dysphagia, oropharyngeal phase: Secondary | ICD-10-CM | POA: Diagnosis not present

## 2014-09-18 DIAGNOSIS — R296 Repeated falls: Secondary | ICD-10-CM | POA: Diagnosis not present

## 2014-09-19 DIAGNOSIS — M6281 Muscle weakness (generalized): Secondary | ICD-10-CM | POA: Diagnosis not present

## 2014-09-19 DIAGNOSIS — G309 Alzheimer's disease, unspecified: Secondary | ICD-10-CM | POA: Diagnosis not present

## 2014-09-19 DIAGNOSIS — R1312 Dysphagia, oropharyngeal phase: Secondary | ICD-10-CM | POA: Diagnosis not present

## 2014-09-19 DIAGNOSIS — R278 Other lack of coordination: Secondary | ICD-10-CM | POA: Diagnosis not present

## 2014-09-19 DIAGNOSIS — R296 Repeated falls: Secondary | ICD-10-CM | POA: Diagnosis not present

## 2014-09-20 DIAGNOSIS — R1312 Dysphagia, oropharyngeal phase: Secondary | ICD-10-CM | POA: Diagnosis not present

## 2014-09-20 DIAGNOSIS — M6281 Muscle weakness (generalized): Secondary | ICD-10-CM | POA: Diagnosis not present

## 2014-09-20 DIAGNOSIS — R296 Repeated falls: Secondary | ICD-10-CM | POA: Diagnosis not present

## 2014-09-20 DIAGNOSIS — G309 Alzheimer's disease, unspecified: Secondary | ICD-10-CM | POA: Diagnosis not present

## 2014-09-20 DIAGNOSIS — R278 Other lack of coordination: Secondary | ICD-10-CM | POA: Diagnosis not present

## 2014-09-21 DIAGNOSIS — R296 Repeated falls: Secondary | ICD-10-CM | POA: Diagnosis not present

## 2014-09-21 DIAGNOSIS — R278 Other lack of coordination: Secondary | ICD-10-CM | POA: Diagnosis not present

## 2014-09-21 DIAGNOSIS — R1312 Dysphagia, oropharyngeal phase: Secondary | ICD-10-CM | POA: Diagnosis not present

## 2014-09-21 DIAGNOSIS — M6281 Muscle weakness (generalized): Secondary | ICD-10-CM | POA: Diagnosis not present

## 2014-09-21 DIAGNOSIS — G309 Alzheimer's disease, unspecified: Secondary | ICD-10-CM | POA: Diagnosis not present

## 2014-09-22 DIAGNOSIS — M6281 Muscle weakness (generalized): Secondary | ICD-10-CM | POA: Diagnosis not present

## 2014-09-22 DIAGNOSIS — R296 Repeated falls: Secondary | ICD-10-CM | POA: Diagnosis not present

## 2014-09-22 DIAGNOSIS — G309 Alzheimer's disease, unspecified: Secondary | ICD-10-CM | POA: Diagnosis not present

## 2014-09-22 DIAGNOSIS — R278 Other lack of coordination: Secondary | ICD-10-CM | POA: Diagnosis not present

## 2014-09-22 DIAGNOSIS — R1312 Dysphagia, oropharyngeal phase: Secondary | ICD-10-CM | POA: Diagnosis not present

## 2014-09-23 DIAGNOSIS — R1312 Dysphagia, oropharyngeal phase: Secondary | ICD-10-CM | POA: Diagnosis not present

## 2014-09-23 DIAGNOSIS — G309 Alzheimer's disease, unspecified: Secondary | ICD-10-CM | POA: Diagnosis not present

## 2014-09-23 DIAGNOSIS — R278 Other lack of coordination: Secondary | ICD-10-CM | POA: Diagnosis not present

## 2014-09-23 DIAGNOSIS — M6281 Muscle weakness (generalized): Secondary | ICD-10-CM | POA: Diagnosis not present

## 2014-09-23 DIAGNOSIS — R296 Repeated falls: Secondary | ICD-10-CM | POA: Diagnosis not present

## 2014-09-26 DIAGNOSIS — R1312 Dysphagia, oropharyngeal phase: Secondary | ICD-10-CM | POA: Diagnosis not present

## 2014-09-26 DIAGNOSIS — M6281 Muscle weakness (generalized): Secondary | ICD-10-CM | POA: Diagnosis not present

## 2014-09-26 DIAGNOSIS — G309 Alzheimer's disease, unspecified: Secondary | ICD-10-CM | POA: Diagnosis not present

## 2014-09-26 DIAGNOSIS — R296 Repeated falls: Secondary | ICD-10-CM | POA: Diagnosis not present

## 2014-09-26 DIAGNOSIS — R278 Other lack of coordination: Secondary | ICD-10-CM | POA: Diagnosis not present

## 2014-09-27 DIAGNOSIS — G309 Alzheimer's disease, unspecified: Secondary | ICD-10-CM | POA: Diagnosis not present

## 2014-09-27 DIAGNOSIS — R1312 Dysphagia, oropharyngeal phase: Secondary | ICD-10-CM | POA: Diagnosis not present

## 2014-09-27 DIAGNOSIS — R296 Repeated falls: Secondary | ICD-10-CM | POA: Diagnosis not present

## 2014-09-27 DIAGNOSIS — M6281 Muscle weakness (generalized): Secondary | ICD-10-CM | POA: Diagnosis not present

## 2014-09-27 DIAGNOSIS — R278 Other lack of coordination: Secondary | ICD-10-CM | POA: Diagnosis not present

## 2014-09-28 DIAGNOSIS — G309 Alzheimer's disease, unspecified: Secondary | ICD-10-CM | POA: Diagnosis not present

## 2014-09-28 DIAGNOSIS — R1312 Dysphagia, oropharyngeal phase: Secondary | ICD-10-CM | POA: Diagnosis not present

## 2014-09-28 DIAGNOSIS — M6281 Muscle weakness (generalized): Secondary | ICD-10-CM | POA: Diagnosis not present

## 2014-09-28 DIAGNOSIS — R278 Other lack of coordination: Secondary | ICD-10-CM | POA: Diagnosis not present

## 2014-09-28 DIAGNOSIS — R296 Repeated falls: Secondary | ICD-10-CM | POA: Diagnosis not present

## 2014-09-29 DIAGNOSIS — R1312 Dysphagia, oropharyngeal phase: Secondary | ICD-10-CM | POA: Diagnosis not present

## 2014-09-29 DIAGNOSIS — G309 Alzheimer's disease, unspecified: Secondary | ICD-10-CM | POA: Diagnosis not present

## 2014-09-29 DIAGNOSIS — M6281 Muscle weakness (generalized): Secondary | ICD-10-CM | POA: Diagnosis not present

## 2014-09-29 DIAGNOSIS — R296 Repeated falls: Secondary | ICD-10-CM | POA: Diagnosis not present

## 2014-09-29 DIAGNOSIS — R278 Other lack of coordination: Secondary | ICD-10-CM | POA: Diagnosis not present

## 2014-09-30 DIAGNOSIS — R278 Other lack of coordination: Secondary | ICD-10-CM | POA: Diagnosis not present

## 2014-09-30 DIAGNOSIS — R296 Repeated falls: Secondary | ICD-10-CM | POA: Diagnosis not present

## 2014-09-30 DIAGNOSIS — R1312 Dysphagia, oropharyngeal phase: Secondary | ICD-10-CM | POA: Diagnosis not present

## 2014-09-30 DIAGNOSIS — M6281 Muscle weakness (generalized): Secondary | ICD-10-CM | POA: Diagnosis not present

## 2014-09-30 DIAGNOSIS — G309 Alzheimer's disease, unspecified: Secondary | ICD-10-CM | POA: Diagnosis not present

## 2014-10-02 DIAGNOSIS — R1312 Dysphagia, oropharyngeal phase: Secondary | ICD-10-CM | POA: Diagnosis not present

## 2014-10-02 DIAGNOSIS — R278 Other lack of coordination: Secondary | ICD-10-CM | POA: Diagnosis not present

## 2014-10-02 DIAGNOSIS — R296 Repeated falls: Secondary | ICD-10-CM | POA: Diagnosis not present

## 2014-10-02 DIAGNOSIS — M6281 Muscle weakness (generalized): Secondary | ICD-10-CM | POA: Diagnosis not present

## 2014-10-02 DIAGNOSIS — G309 Alzheimer's disease, unspecified: Secondary | ICD-10-CM | POA: Diagnosis not present

## 2014-10-03 DIAGNOSIS — R278 Other lack of coordination: Secondary | ICD-10-CM | POA: Diagnosis not present

## 2014-10-03 DIAGNOSIS — M6281 Muscle weakness (generalized): Secondary | ICD-10-CM | POA: Diagnosis not present

## 2014-10-03 DIAGNOSIS — R1312 Dysphagia, oropharyngeal phase: Secondary | ICD-10-CM | POA: Diagnosis not present

## 2014-10-03 DIAGNOSIS — G309 Alzheimer's disease, unspecified: Secondary | ICD-10-CM | POA: Diagnosis not present

## 2014-10-03 DIAGNOSIS — R296 Repeated falls: Secondary | ICD-10-CM | POA: Diagnosis not present

## 2014-10-04 DIAGNOSIS — R296 Repeated falls: Secondary | ICD-10-CM | POA: Diagnosis not present

## 2014-10-04 DIAGNOSIS — R1312 Dysphagia, oropharyngeal phase: Secondary | ICD-10-CM | POA: Diagnosis not present

## 2014-10-04 DIAGNOSIS — G309 Alzheimer's disease, unspecified: Secondary | ICD-10-CM | POA: Diagnosis not present

## 2014-10-04 DIAGNOSIS — R278 Other lack of coordination: Secondary | ICD-10-CM | POA: Diagnosis not present

## 2014-10-04 DIAGNOSIS — M6281 Muscle weakness (generalized): Secondary | ICD-10-CM | POA: Diagnosis not present

## 2014-10-05 DIAGNOSIS — R296 Repeated falls: Secondary | ICD-10-CM | POA: Diagnosis not present

## 2014-10-05 DIAGNOSIS — M6281 Muscle weakness (generalized): Secondary | ICD-10-CM | POA: Diagnosis not present

## 2014-10-05 DIAGNOSIS — R278 Other lack of coordination: Secondary | ICD-10-CM | POA: Diagnosis not present

## 2014-10-05 DIAGNOSIS — R1312 Dysphagia, oropharyngeal phase: Secondary | ICD-10-CM | POA: Diagnosis not present

## 2014-10-05 DIAGNOSIS — G309 Alzheimer's disease, unspecified: Secondary | ICD-10-CM | POA: Diagnosis not present

## 2014-10-06 DIAGNOSIS — R296 Repeated falls: Secondary | ICD-10-CM | POA: Diagnosis not present

## 2014-10-06 DIAGNOSIS — G309 Alzheimer's disease, unspecified: Secondary | ICD-10-CM | POA: Diagnosis not present

## 2014-10-06 DIAGNOSIS — R1312 Dysphagia, oropharyngeal phase: Secondary | ICD-10-CM | POA: Diagnosis not present

## 2014-10-06 DIAGNOSIS — R278 Other lack of coordination: Secondary | ICD-10-CM | POA: Diagnosis not present

## 2014-10-06 DIAGNOSIS — M6281 Muscle weakness (generalized): Secondary | ICD-10-CM | POA: Diagnosis not present

## 2014-10-10 DIAGNOSIS — R278 Other lack of coordination: Secondary | ICD-10-CM | POA: Diagnosis not present

## 2014-10-10 DIAGNOSIS — G309 Alzheimer's disease, unspecified: Secondary | ICD-10-CM | POA: Diagnosis not present

## 2014-10-10 DIAGNOSIS — R1312 Dysphagia, oropharyngeal phase: Secondary | ICD-10-CM | POA: Diagnosis not present

## 2014-10-10 DIAGNOSIS — M6281 Muscle weakness (generalized): Secondary | ICD-10-CM | POA: Diagnosis not present

## 2014-10-10 DIAGNOSIS — R296 Repeated falls: Secondary | ICD-10-CM | POA: Diagnosis not present

## 2014-10-11 ENCOUNTER — Encounter: Payer: Self-pay | Admitting: Adult Health

## 2014-10-11 ENCOUNTER — Non-Acute Institutional Stay (SKILLED_NURSING_FACILITY): Payer: Medicare Other | Admitting: Adult Health

## 2014-10-11 DIAGNOSIS — D509 Iron deficiency anemia, unspecified: Secondary | ICD-10-CM

## 2014-10-11 DIAGNOSIS — M6281 Muscle weakness (generalized): Secondary | ICD-10-CM | POA: Diagnosis not present

## 2014-10-11 DIAGNOSIS — R296 Repeated falls: Secondary | ICD-10-CM | POA: Diagnosis not present

## 2014-10-11 DIAGNOSIS — E118 Type 2 diabetes mellitus with unspecified complications: Secondary | ICD-10-CM | POA: Diagnosis not present

## 2014-10-11 DIAGNOSIS — I10 Essential (primary) hypertension: Secondary | ICD-10-CM | POA: Diagnosis not present

## 2014-10-11 DIAGNOSIS — R1314 Dysphagia, pharyngoesophageal phase: Secondary | ICD-10-CM

## 2014-10-11 DIAGNOSIS — F0280 Dementia in other diseases classified elsewhere without behavioral disturbance: Secondary | ICD-10-CM

## 2014-10-11 DIAGNOSIS — R1312 Dysphagia, oropharyngeal phase: Secondary | ICD-10-CM | POA: Diagnosis not present

## 2014-10-11 DIAGNOSIS — R278 Other lack of coordination: Secondary | ICD-10-CM | POA: Diagnosis not present

## 2014-10-11 DIAGNOSIS — G309 Alzheimer's disease, unspecified: Secondary | ICD-10-CM

## 2014-10-11 NOTE — Progress Notes (Signed)
Patient ID: [name], male   DOB: [birth year], 81 y.o.   MRN: [id number]    Nursing Home Location:  [facility]   Code Status: DNR   Place of Service: SNF (31)  Chief Complaint  Patient presents with  . Medical Management of Chronic Issues    HPI:  81 y.o. male  residing at [facility], memory care section..  He has a hx of CAD, AD, prostate ca, anemia, HLD, OA, GI bleed, and DM Type 2. He was treated with Levaquin last month for a fever and elevated white count with a cough. His CXR on 09/12/14 was unremarkable but there has been concern for aspiration due to coughing with meals. Speech has been following him and he is currently on a mech soft diet with ground meats and NTL and tolerating this well. His weight has trended downward by 5 lbs to 192 lb. He apparently has decreased intake, however, in the past he has been gaining weight and taking food from other residents trays. Last MMSE on 20/30 in 05/2014. BIMS 3/15 on 07/22/14.    Review of Systems:  Review of Systems  Constitutional: Negative for fever, chills, diaphoresis, activity change, appetite change and fatigue.  HENT: Positive for trouble swallowing. Negative for congestion, sinus pressure and sore throat.        HOH  Respiratory: Negative for cough and shortness of breath.   Cardiovascular: Negative for chest pain, palpitations and leg swelling.  Gastrointestinal: Negative for abdominal pain, constipation and abdominal distention.  Endocrine: Negative for polydipsia and polyphagia.  Genitourinary: Negative for dysuria and difficulty urinating.       Incontinent  Musculoskeletal: Positive for arthralgias and gait problem.  Neurological: Negative for tremors, seizures, syncope, facial asymmetry, speech difficulty and light-headedness.  Psychiatric/Behavioral: Positive for behavioral problems and confusion. Negative for agitation.       H/o sexually inappropriate behavior     Medications: Patient's Medications  New Prescriptions   No medications on file  Previous Medications   ACETAMINOPHEN (TYLENOL) 325 MG TABLET    Take 650 mg by mouth 3 (three) times daily.    CHOLECALCIFEROL (VITAMIN D) 2000 UNITS CAPS    Take by mouth.   CYANOCOBALAMIN (VITAMIN B 12 PO)    Take 1,000 mcg by mouth daily.   DEXTROMETHORPHAN (DELSYM) 30 MG/5ML LIQUID    Take by mouth. 1 tsp every 12 hours as needed for cough   DICLOFENAC SODIUM (VOLTAREN) 1 % GEL    Apply 4 g topically 3 (three) times daily. Apply 2grams to each knee 3 times daily to reduce pain re: OA   FERROUS SULFATE 325 (65 FE) MG TABLET    Take 325 mg by mouth daily with breakfast.   FLUOCINONIDE CREAM (LIDEX) 0.05 %    Apply 1 application topically 2 (two) times daily. Apply to hands BID as needed   INSULIN GLARGINE (LANTUS SOLOSTAR) 100 UNIT/ML SOLOSTAR PEN    Inject 10 Units into the skin at bedtime.   LATANOPROST (XALATAN) 0.005 % OPHTHALMIC SOLUTION    Place 1 drop into the right eye at bedtime.   LORATADINE (CLARITIN) 10 MG TABLET    Take 10 mg by mouth daily as needed for allergies.   MEMANTINE HCL ER (NAMENDA XR) 28 MG CP24    Take by mouth.   METOPROLOL (TOPROL-XL) 50 MG 24 HR TABLET    Take 50 mg by mouth daily.     MULTIPLE VITAMINS-MINERALS (ICAPS MV) TABS  Take by mouth. Take one daily for eyes   OMEPRAZOLE (PRILOSEC) 20 MG CAPSULE    Take 20 mg by mouth 2 (two) times daily.    PAROXETINE (PAXIL) 20 MG TABLET    Take 20 mg by mouth daily.   POLYETHYLENE GLYCOL (MIRALAX / GLYCOLAX) PACKET    Take 17 g by mouth daily.   POLYVINYL ALCOHOL (LIQUIFILM TEARS) 1.4 % OPHTHALMIC SOLUTION    Place 1 drop into both eyes. One drop four times daily as needed   SENNA (SENOKOT) 8.6 MG TABLET    Take 1 tablet by mouth daily.    TRIAMCINOLONE CREAM (KENALOG) 0.1 %    Apply 1 application topically. Apply thin layer to arms, legs and back as needed  Modified Medications   No medications on file  Discontinued Medications    No medications on file     Physical Exam:  Filed Vitals:   10/11/14 1128  BP: 127/71  Pulse: 61  Temp: 97.6 F (36.4 C)  Resp: 18  Weight: 192 lb (87.091 kg)  SpO2: 95%   Wt Readings from Last 3 Encounters:  10/11/14 192 lb (87.091 kg)  09/01/14 197 lb 12.8 oz (89.721 kg)  07/25/14 196 lb (88.905 kg)   Physical Exam  Constitutional: No distress.  HENT:  Deaf, uses assistive device  Neck: No JVD present. No tracheal deviation present.  Cardiovascular: Normal rate, regular rhythm, normal heart sounds and intact distal pulses.   No murmur heard. Pulmonary/Chest: Effort normal and breath sounds normal. No respiratory distress. He has no wheezes.  Abdominal: Soft. Bowel sounds are normal. He exhibits no distension. There is no tenderness.  Musculoskeletal: He exhibits no edema or tenderness.  Decreased ROM to shoulders, hips, knees  Lymphadenopathy:    He has no cervical adenopathy.  Neurological: He is alert. No cranial nerve deficit.  Oriented x2   Skin: Skin is warm and dry. He is not diaphoretic. No erythema.  Psychiatric: Affect normal.    Labs reviewed/Significant Diagnostic Results:  Basic Metabolic Panel:  Recent Labs  05/12/14 07/27/14 09/12/14  NA 141 141 138  K 4.6 4.6 4.8  BUN 31* 26* 28*  CREATININE 1.4* 1.3 1.5*   Liver Function Tests: No results for input(s): AST, ALT, ALKPHOS, BILITOT, PROT, ALBUMIN in the last 8760 hours. No results for input(s): LIPASE, AMYLASE in the last 8760 hours. No results for input(s): AMMONIA in the last 8760 hours. CBC:  Recent Labs  01/25/14 05/12/14 07/27/14 09/12/14  WBC 7.6 7.2 6.7 8.7  HGB 7.8* 10.2* 10.3* 9.9*  HCT 24* 30* 31*  --   PLT 407* 262 243 224   CBG: No results for input(s): GLUCAP in the last 8760 hours. TSH:  Recent Labs  07/27/14  TSH 2.41   A1C: Lab Results  Component Value Date   HGBA1C 7.1* 07/27/2014   Lipid Panel: No results for input(s): CHOL, HDL, LDLCALC, TRIG, CHOLHDL,  LDLDIRECT in the last 8760 hours. 04/07/14: uric acid 6.9 05/12/14: Iron 53, %sat 24, TIBC 219, B12 1043 07/27/2014: Vit D 24   Assessment/Plan   1. Alzheimer disease with behavioral disturbance Stable on Namenda. Also on Paxil for sexually inappropriate behaviors. Continue at this time.   2. Dysphagia, pharyngoesophageal phase Continue current diet recommendation and aspiration precautions. Monitor for signs of dehydration while on NTL  3. Type 2 diabetes mellitus with complication Blood sugars stable ranging 106-32. Continue current dose of Lantus  4. Essential hypertension Stable, continue BB.  5. Iron deficiency  anemia Hgb stable on iron. Tolerating with no s/s. Has a hx of GI bleed but no aggressive work up. No blood in stools per staff  6. CKD Stable, no ACE due to age and debility.      Cindi Carbon, ANP [facility] [phone]

## 2014-10-12 DIAGNOSIS — R296 Repeated falls: Secondary | ICD-10-CM | POA: Diagnosis not present

## 2014-10-12 DIAGNOSIS — G309 Alzheimer's disease, unspecified: Secondary | ICD-10-CM | POA: Diagnosis not present

## 2014-10-12 DIAGNOSIS — R278 Other lack of coordination: Secondary | ICD-10-CM | POA: Diagnosis not present

## 2014-10-12 DIAGNOSIS — R1312 Dysphagia, oropharyngeal phase: Secondary | ICD-10-CM | POA: Diagnosis not present

## 2014-10-12 DIAGNOSIS — M6281 Muscle weakness (generalized): Secondary | ICD-10-CM | POA: Diagnosis not present

## 2014-10-13 DIAGNOSIS — R278 Other lack of coordination: Secondary | ICD-10-CM | POA: Diagnosis not present

## 2014-10-13 DIAGNOSIS — M6281 Muscle weakness (generalized): Secondary | ICD-10-CM | POA: Diagnosis not present

## 2014-10-13 DIAGNOSIS — R1312 Dysphagia, oropharyngeal phase: Secondary | ICD-10-CM | POA: Diagnosis not present

## 2014-10-13 DIAGNOSIS — R296 Repeated falls: Secondary | ICD-10-CM | POA: Diagnosis not present

## 2014-10-13 DIAGNOSIS — G309 Alzheimer's disease, unspecified: Secondary | ICD-10-CM | POA: Diagnosis not present

## 2014-10-14 DIAGNOSIS — R296 Repeated falls: Secondary | ICD-10-CM | POA: Diagnosis not present

## 2014-10-14 DIAGNOSIS — G309 Alzheimer's disease, unspecified: Secondary | ICD-10-CM | POA: Diagnosis not present

## 2014-10-14 DIAGNOSIS — M6281 Muscle weakness (generalized): Secondary | ICD-10-CM | POA: Diagnosis not present

## 2014-10-14 DIAGNOSIS — R278 Other lack of coordination: Secondary | ICD-10-CM | POA: Diagnosis not present

## 2014-10-14 DIAGNOSIS — R1312 Dysphagia, oropharyngeal phase: Secondary | ICD-10-CM | POA: Diagnosis not present

## 2014-10-17 DIAGNOSIS — R296 Repeated falls: Secondary | ICD-10-CM | POA: Diagnosis not present

## 2014-10-17 DIAGNOSIS — R1312 Dysphagia, oropharyngeal phase: Secondary | ICD-10-CM | POA: Diagnosis not present

## 2014-10-17 DIAGNOSIS — G309 Alzheimer's disease, unspecified: Secondary | ICD-10-CM | POA: Diagnosis not present

## 2014-10-17 DIAGNOSIS — R278 Other lack of coordination: Secondary | ICD-10-CM | POA: Diagnosis not present

## 2014-10-17 DIAGNOSIS — M6281 Muscle weakness (generalized): Secondary | ICD-10-CM | POA: Diagnosis not present

## 2014-10-18 DIAGNOSIS — M6281 Muscle weakness (generalized): Secondary | ICD-10-CM | POA: Diagnosis not present

## 2014-10-18 DIAGNOSIS — R1312 Dysphagia, oropharyngeal phase: Secondary | ICD-10-CM | POA: Diagnosis not present

## 2014-10-18 DIAGNOSIS — R278 Other lack of coordination: Secondary | ICD-10-CM | POA: Diagnosis not present

## 2014-10-18 DIAGNOSIS — G309 Alzheimer's disease, unspecified: Secondary | ICD-10-CM | POA: Diagnosis not present

## 2014-10-18 DIAGNOSIS — R296 Repeated falls: Secondary | ICD-10-CM | POA: Diagnosis not present

## 2014-10-19 DIAGNOSIS — R296 Repeated falls: Secondary | ICD-10-CM | POA: Diagnosis not present

## 2014-10-19 DIAGNOSIS — M6281 Muscle weakness (generalized): Secondary | ICD-10-CM | POA: Diagnosis not present

## 2014-10-19 DIAGNOSIS — R278 Other lack of coordination: Secondary | ICD-10-CM | POA: Diagnosis not present

## 2014-10-19 DIAGNOSIS — R1312 Dysphagia, oropharyngeal phase: Secondary | ICD-10-CM | POA: Diagnosis not present

## 2014-10-19 DIAGNOSIS — G309 Alzheimer's disease, unspecified: Secondary | ICD-10-CM | POA: Diagnosis not present

## 2014-10-25 DIAGNOSIS — E0865 Diabetes mellitus due to underlying condition with hyperglycemia: Secondary | ICD-10-CM | POA: Diagnosis not present

## 2014-10-25 LAB — HEMOGLOBIN A1C: Hgb A1c MFr Bld: 7 % — AB (ref 4.0–6.0)

## 2014-11-07 ENCOUNTER — Non-Acute Institutional Stay (SKILLED_NURSING_FACILITY): Payer: Medicare Other | Admitting: Adult Health

## 2014-11-07 DIAGNOSIS — N189 Chronic kidney disease, unspecified: Secondary | ICD-10-CM | POA: Diagnosis not present

## 2014-11-07 DIAGNOSIS — G309 Alzheimer's disease, unspecified: Secondary | ICD-10-CM

## 2014-11-07 DIAGNOSIS — F0280 Dementia in other diseases classified elsewhere without behavioral disturbance: Secondary | ICD-10-CM

## 2014-11-07 DIAGNOSIS — E1122 Type 2 diabetes mellitus with diabetic chronic kidney disease: Secondary | ICD-10-CM | POA: Diagnosis not present

## 2014-11-07 DIAGNOSIS — I10 Essential (primary) hypertension: Secondary | ICD-10-CM

## 2014-11-07 DIAGNOSIS — D509 Iron deficiency anemia, unspecified: Secondary | ICD-10-CM

## 2014-11-07 DIAGNOSIS — E559 Vitamin D deficiency, unspecified: Secondary | ICD-10-CM

## 2014-11-07 DIAGNOSIS — K219 Gastro-esophageal reflux disease without esophagitis: Secondary | ICD-10-CM | POA: Diagnosis not present

## 2014-11-07 DIAGNOSIS — M170 Bilateral primary osteoarthritis of knee: Secondary | ICD-10-CM

## 2014-11-07 DIAGNOSIS — N184 Chronic kidney disease, stage 4 (severe): Secondary | ICD-10-CM

## 2014-11-07 NOTE — Progress Notes (Signed)
Patient ID: [name], male   DOB: 06-Jul-1922, 81 y.o.   MRN: [id number]    Nursing Home Location:  [facility]   Code Status: DNR   Place of Service: SNF (31)  Chief Complaint  Patient presents with  . Medical Management of Chronic Issues    HPI:  81 y.o. male  residing at [facility], memory care section..  He has a hx of CAD, AD, prostate ca, anemia, HLD, OA, GI bleed, and DM Type 2. I am here to review his chronic medical issues. There are no complaints from the staff today regarding his care.  The nurse voice concerns over crushing some of his meds due to the fact that they are extended release.  He has dysphagia and is on a mech soft diet, with NTL. Last MMSE on 20/30 in 05/2014. BIMS 3/15 on 10/21/14.   He has a hx of GI bleed and omeprazole was discontinued in march per the pharmacy recommendations. He has had no bleeding or symptoms of reflux since that time.  His weight has trended down to 193 lbs, which is favorable as he had been gaining weight and eating off the other residents trays.  Review of Systems:  Review of Systems  Constitutional: Negative for fever, chills, diaphoresis, activity change, appetite change and fatigue.  HENT: Positive for trouble swallowing. Negative for congestion, sinus pressure and sore throat.        HOH  Respiratory: Negative for cough and shortness of breath.   Cardiovascular: Negative for chest pain, palpitations and leg swelling.  Gastrointestinal: Negative for abdominal pain, constipation and abdominal distention.  Endocrine: Negative for polydipsia and polyphagia.  Genitourinary: Negative for dysuria and difficulty urinating.       Incontinent  Musculoskeletal: Positive for arthralgias and gait problem.  Neurological: Negative for tremors, seizures, syncope, facial asymmetry, speech difficulty and light-headedness.  Psychiatric/Behavioral: Positive for behavioral problems and confusion. Negative  for agitation.       H/o sexually inappropriate behavior    Medications: Patient's Medications  New Prescriptions   No medications on file  Previous Medications   ACETAMINOPHEN (TYLENOL) 325 MG TABLET    Take 650 mg by mouth 3 (three) times daily.    CHOLECALCIFEROL (VITAMIN D) 2000 UNITS CAPS    Take by mouth.   CYANOCOBALAMIN (VITAMIN B 12 PO)    Take 1,000 mcg by mouth daily.   DEXTROMETHORPHAN (DELSYM) 30 MG/5ML LIQUID    Take by mouth. 1 tsp every 12 hours as needed for cough   DICLOFENAC SODIUM (VOLTAREN) 1 % GEL    Apply 4 g topically 3 (three) times daily. Apply 2grams to each knee 3 times daily to reduce pain re: OA   FERROUS SULFATE 325 (65 FE) MG TABLET    Take 325 mg by mouth daily with breakfast.   FLUOCINONIDE CREAM (LIDEX) 0.05 %    Apply 1 application topically 2 (two) times daily. Apply to hands BID as needed   INSULIN GLARGINE (LANTUS SOLOSTAR) 100 UNIT/ML SOLOSTAR PEN    Inject 10 Units into the skin at bedtime.   LATANOPROST (XALATAN) 0.005 % OPHTHALMIC SOLUTION    Place 1 drop into the right eye at bedtime.   LORATADINE (CLARITIN) 10 MG TABLET    Take 10 mg by mouth daily as needed for allergies.   MEMANTINE HCL ER (NAMENDA XR) 28 MG CP24    Take by mouth.   METOPROLOL (TOPROL-XL) 50 MG 24 HR TABLET  Take 50 mg by mouth daily.     PAROXETINE (PAXIL) 20 MG TABLET    Take 20 mg by mouth daily.   POLYETHYLENE GLYCOL (MIRALAX / GLYCOLAX) PACKET    Take 17 g by mouth daily.   POLYVINYL ALCOHOL (LIQUIFILM TEARS) 1.4 % OPHTHALMIC SOLUTION    Place 1 drop into both eyes. One drop four times daily as needed   SENNA (SENOKOT) 8.6 MG TABLET    Take 1 tablet by mouth daily.    TRIAMCINOLONE CREAM (KENALOG) 0.1 %    Apply 1 application topically. Apply thin layer to arms, legs and back as needed  Modified Medications   No medications on file  Discontinued Medications   MULTIPLE VITAMINS-MINERALS (ICAPS MV) TABS    Take by mouth. Take one daily for eyes   OMEPRAZOLE (PRILOSEC)  20 MG CAPSULE    Take 20 mg by mouth 2 (two) times daily.      Physical Exam: Wt Readings from Last 3 Encounters:  11/07/14 193 lb 12.8 oz (87.907 kg)  10/11/14 192 lb (87.091 kg)  09/01/14 197 lb 12.8 oz (89.721 kg)    Physical Exam  Constitutional: No distress.  HENT:  Deaf, uses assistive device  Neck: No JVD present. No tracheal deviation present.  Cardiovascular: Normal rate, regular rhythm, normal heart sounds and intact distal pulses.   No murmur heard. Pulmonary/Chest: Effort normal and breath sounds normal. No respiratory distress. He has no wheezes.  Abdominal: Soft. Bowel sounds are normal. He exhibits no distension. There is no tenderness.  Musculoskeletal: He exhibits no edema or tenderness.  Decreased ROM to shoulders, hips, knees  Lymphadenopathy:    He has no cervical adenopathy.  Neurological: He is alert. No cranial nerve deficit.  Oriented x2   Skin: Skin is warm and dry. He is not diaphoretic. No erythema.  Psychiatric: Affect normal.    Labs reviewed/Significant Diagnostic Results:  Basic Metabolic Panel:  Recent Labs  05/12/14 07/27/14 09/12/14  NA 141 141 138  K 4.6 4.6 4.8  BUN 31* 26* 28*  CREATININE 1.4* 1.3 1.5*   Liver Function Tests:  Recent Labs  09/12/14  AST 22  ALT 14  ALKPHOS 51   No results for input(s): LIPASE, AMYLASE in the last 8760 hours. No results for input(s): AMMONIA in the last 8760 hours. CBC:  Recent Labs  01/25/14 05/12/14 07/27/14 09/12/14  WBC 7.6 7.2 6.7 8.7  HGB 7.8* 10.2* 10.3* 9.9*  HCT 24* 30* 31*  --   PLT 407* 262 243 224   CBG: No results for input(s): GLUCAP in the last 8760 hours. TSH:  Recent Labs  07/27/14  TSH 2.41   A1C: Lab Results  Component Value Date   HGBA1C 7.0* 10/25/2014   Lipid Panel: No results for input(s): CHOL, HDL, LDLCALC, TRIG, CHOLHDL, LDLDIRECT in the last 8760 hours. 04/07/14: uric acid 6.9 05/12/14: Iron 53, %sat 24, TIBC 219, B12 1043 07/27/2014: Vit D  24   Assessment/Plan   1. Alzheimer disease with behavioral disturbance Stable on Namenda. Also on Paxil for sexually inappropriate behaviors. Continue at this time.   2. Dysphagia, pharyngoesophageal phase Continue current diet recommendation and aspiration precautions. I changed his metoprolol and iro to a crushable form, the tylenol to liquid, and d/c'd the icap because they are hard to swallow  3. Type 2 diabetes mellitus with complication Blood sugars stable ranging 113-138. Continue current dose of Lantus  4. Essential hypertension Stable, continue BB.  5. Iron deficiency anemia  Hgb stable on iron. Tolerating with no s/s. Has a hx of GI bleed but no aggressive work up. No blood in stools per staff  6. CKD Stable, no ACE due to age and debility.  7. GERD No issues at this point off prilosec, continue to monitor.   8. Vitamin D deficiency Currently on supplement, recheck level   Cindi Carbon, [place] [phone]

## 2014-11-08 DIAGNOSIS — E559 Vitamin D deficiency, unspecified: Secondary | ICD-10-CM | POA: Diagnosis not present

## 2014-11-08 DIAGNOSIS — E619 Deficiency of nutrient element, unspecified: Secondary | ICD-10-CM | POA: Diagnosis not present

## 2014-11-09 ENCOUNTER — Encounter: Payer: Self-pay | Admitting: Internal Medicine

## 2014-11-21 ENCOUNTER — Encounter: Payer: Self-pay | Admitting: Adult Health

## 2014-11-21 ENCOUNTER — Non-Acute Institutional Stay (SKILLED_NURSING_FACILITY): Payer: Medicare Other | Admitting: Adult Health

## 2014-11-21 DIAGNOSIS — B3789 Other sites of candidiasis: Secondary | ICD-10-CM | POA: Diagnosis not present

## 2014-11-21 NOTE — Progress Notes (Signed)
Patient ID: [name], male   DOB: [birth year], 81 y.o.   MRN: [id number]    Nursing Home Location:  [facility]   Code Status: DNR   Place of Service: SNF (31)  Chief Complaint  Patient presents with  . Acute Visit    groin rash    HPI:  81 y.o. male residing at [facility] section. I was asked to see him for c/o of a rash noted to his groin present for several days. The staff has been using barrier cream without relief. He has a hx of AD and can not contribute to the HPI.       Review of Systems:  Review of Systems  Constitutional: Negative for fever and chills.  Genitourinary:       Incontinent  Skin: Positive for rash.    Medications: Patient's Medications  New Prescriptions   No medications on file  Previous Medications   ACETAMINOPHEN (TYLENOL) 325 MG TABLET    Take 650 mg by mouth 3 (three) times daily.    CHOLECALCIFEROL (VITAMIN D) 2000 UNITS CAPS    Take by mouth.   CYANOCOBALAMIN (VITAMIN B 12 PO)    Take 1,000 mcg by mouth daily.   DEXTROMETHORPHAN (DELSYM) 30 MG/5ML LIQUID    Take by mouth. 1 tsp every 12 hours as needed for cough   DICLOFENAC SODIUM (VOLTAREN) 1 % GEL    Apply 4 g topically 3 (three) times daily. Apply 2grams to each knee 3 times daily to reduce pain re: OA   FERROUS SULFATE 325 (65 FE) MG TABLET    Take 325 mg by mouth daily with breakfast.   FLUOCINONIDE CREAM (LIDEX) 0.05 %    Apply 1 application topically 2 (two) times daily. Apply to hands BID as needed   INSULIN GLARGINE (LANTUS SOLOSTAR) 100 UNIT/ML SOLOSTAR PEN    Inject 10 Units into the skin at bedtime.   LATANOPROST (XALATAN) 0.005 % OPHTHALMIC SOLUTION    Place 1 drop into the right eye at bedtime.   LORATADINE (CLARITIN) 10 MG TABLET    Take 10 mg by mouth daily as needed for allergies.   MEMANTINE HCL ER (NAMENDA XR) 28 MG CP24    Take by mouth.   METOPROLOL (TOPROL-XL) 50 MG 24 HR TABLET    Take 50 mg by mouth daily.     PAROXETINE (PAXIL) 20 MG TABLET    Take 20 mg by mouth daily.   POLYETHYLENE GLYCOL (MIRALAX / GLYCOLAX) PACKET    Take 17 g by mouth daily.   POLYVINYL ALCOHOL (LIQUIFILM TEARS) 1.4 % OPHTHALMIC SOLUTION    Place 1 drop into both eyes. One drop four times daily as needed   SENNA (SENOKOT) 8.6 MG TABLET    Take 1 tablet by mouth daily.    TRIAMCINOLONE CREAM (KENALOG) 0.1 %    Apply 1 application topically. Apply thin layer to arms, legs and back as needed  Modified Medications   No medications on file  Discontinued Medications   No medications on file     Physical Exam:  There were no vitals filed for this visit.  Physical Exam  Constitutional: No distress.  Neurological: He is alert.  Oriented to self  Skin: Skin is warm and dry. Rash noted. He is not diaphoretic. There is erythema.  Noted to scrotum and inguinal area  Psychiatric: Affect normal.    Labs reviewed/Significant Diagnostic Results:  Basic Metabolic Panel:  Recent Labs  05/12/14 07/27/14 09/12/14  NA 141 141 138  K 4.6 4.6 4.8  BUN 31* 26* 28*  CREATININE 1.4* 1.3 1.5*   Liver Function Tests:  Recent Labs  09/12/14  AST 22  ALT 14  ALKPHOS 51   No results for input(s): LIPASE, AMYLASE in the last 8760 hours. No results for input(s): AMMONIA in the last 8760 hours. CBC:  Recent Labs  01/25/14 05/12/14 07/27/14 09/12/14  WBC 7.6 7.2 6.7 8.7  HGB 7.8* 10.2* 10.3* 9.9*  HCT 24* 30* 31*  --   PLT 407* 262 243 224   CBG: No results for input(s): GLUCAP in the last 8760 hours. TSH:  Recent Labs  07/27/14  TSH 2.41   A1C: Lab Results  Component Value Date   HGBA1C 7.0* 10/25/2014   Lipid Panel: No results for input(s): CHOL, HDL, LDLCALC, TRIG, CHOLHDL, LDLDIRECT in the last 8760 hours.     Assessment/Plan   1. Candida rash of groin -Diflucan 150mg  x 1 dose -Keep perineal area dry -Use nystatin powder with brief changes  Cindi Carbon, ANP [facility] [phone]

## 2014-12-14 ENCOUNTER — Non-Acute Institutional Stay (SKILLED_NURSING_FACILITY): Payer: Medicare Other | Admitting: Internal Medicine

## 2014-12-14 DIAGNOSIS — I10 Essential (primary) hypertension: Secondary | ICD-10-CM | POA: Diagnosis not present

## 2014-12-14 DIAGNOSIS — E1122 Type 2 diabetes mellitus with diabetic chronic kidney disease: Secondary | ICD-10-CM | POA: Diagnosis not present

## 2014-12-14 DIAGNOSIS — N189 Chronic kidney disease, unspecified: Secondary | ICD-10-CM | POA: Diagnosis not present

## 2014-12-14 DIAGNOSIS — K219 Gastro-esophageal reflux disease without esophagitis: Secondary | ICD-10-CM | POA: Diagnosis not present

## 2014-12-14 DIAGNOSIS — K5901 Slow transit constipation: Secondary | ICD-10-CM | POA: Diagnosis not present

## 2014-12-14 DIAGNOSIS — G309 Alzheimer's disease, unspecified: Secondary | ICD-10-CM

## 2014-12-14 DIAGNOSIS — F0280 Dementia in other diseases classified elsewhere without behavioral disturbance: Secondary | ICD-10-CM

## 2014-12-14 DIAGNOSIS — D509 Iron deficiency anemia, unspecified: Secondary | ICD-10-CM

## 2014-12-14 NOTE — Progress Notes (Signed)
Patient ID: [name], male   DOB: [birth year], 81 y.o.   MRN: [id number]  Location:  [facility] Provider:  Rexene Edison. Mariea Clonts, D.O., C.M.D.  Code Status:  DNR Goals of care: Advanced Directive information Does patient have an advance directive?: Yes, Type of Advance Directive: [place];Living will;Out of facility DNR (pink MOST or yellow form), Pre-existing out of facility DNR order (yellow form or pink MOST form): Pink MOST form placed in chart (order not valid for inpatient use), Does patient want to make changes to advanced directive?: No - Patient declined  Chief Complaint  Patient presents with  . Medical Management of Chronic Issues    HPI:  81 yo male with h/o DMII with renal manifestations, controlled, Alzheimer's with some h/o sexual disinhibition, htn, hyperlipidemia, seizures, arthritis, CAD, falls and cervical OA among others was seen for med mgt of chronic diseases.  He was in bed and his CNA was shaving him.  He would not speak at all today.  He would not open his eyes.  She advised me that he behaves this way sometimes.  They have no new concerns about him.  Review of Systems: pt would not speak today Review of Systems  Unable to perform ROS: dementia  HENT: Positive for hearing loss.     Past Medical History  Diagnosis Date  . Anemia 2012  . Arthritis   . Diabetes mellitus 1997  . Hyperlipidemia   . Hypertension 2008  . Actinic keratosis   . Macular degeneration (senile) of retina, unspecified   . Epilepsy 1943  . Unspecified venous (peripheral) insufficiency   . Glaucoma   . H/O prostate cancer 2011    treated with radiation  . Major depressive disorder, single episode, unspecified   . Other B-complex deficiencies 2012  . Unspecified vitamin D deficiency 2010  . Reflux esophagitis 2002  . Generalized osteoarthrosis, unspecified site   . Personal history of colonic polyps 2012    adenomatous  . Coronary atherosclerosis of native  coronary artery 2002    60% stenois of the 1st diagonal of the LAD  . Type II or unspecified type diabetes mellitus with peripheral circulatory disorders, not stated as uncontrolled(250.70) 05/24/2009    Qualifier: Diagnosis of  By: Caryl Comes, MD, Remus Blake   . Alzheimer disease 09/13/2008    Qualifier: Diagnosis of  By: Nils Pyle CMA (Byers), Mearl Latin    . History of fall 2013  . Deaf   . 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 Gastric ulcer 2012  . Osteoarthritis of knee 10/07/2013    Qualifier: Diagnosis of  By: Nils Pyle CMA Deborra Medina), Mearl Latin      Patient Active Problem List   Diagnosis Date Noted  . Type 2 diabetes mellitus with renal manifestations 11/07/2014  . Dysphagia, pharyngoesophageal phase 10/11/2014  . Weight gain 09/01/2014  . GERD (gastroesophageal reflux disease) 06/28/2014  . Constipation 12/14/2013  . Heme positive stool 12/14/2013  . Osteoarthritis 10/19/2013  . Osteoarthritis of knee 10/07/2013  . Knee pain, acute 10/06/2013  . Deaf   . History of fall   . Other B-complex deficiencies   . Hyperlipidemia   . H/O prostate cancer   . Alzheimer disease 09/15/2013  . Vitamin B12 deficiency 07/24/2011  . Iron deficiency anemia 09/18/2010  . Vitamin D deficiency 09/13/2010  . Anemia 09/13/2010  . HEARING LOSS 09/13/2010  . Essential hypertension 09/13/2010  . CAD 09/13/2010  .  NODULAR PROSTATE WITHOUT URINARY OBSTRUCTION 09/13/2010  . SEIZURE DISORDER 09/13/2010  . COLONIC POLYPS, ADENOMATOUS, HX OF 09/13/2010    Allergies  Allergen Reactions  . Aspirin   . Nsaids     Medications: Patient's Medications  New Prescriptions   No medications on file  Previous Medications   ACETAMINOPHEN (TYLENOL) 325 MG TABLET    Take 650 mg by mouth 3 (three) times daily.    CHOLECALCIFEROL (VITAMIN D) 2000 UNITS CAPS    Take by mouth.   CYANOCOBALAMIN (VITAMIN B 12 PO)    Take 1,000 mcg by mouth daily.   DEXTROMETHORPHAN  (DELSYM) 30 MG/5ML LIQUID    Take by mouth. 1 tsp every 12 hours as needed for cough   DICLOFENAC SODIUM (VOLTAREN) 1 % GEL    Apply 4 g topically 3 (three) times daily. Apply 2grams to each knee 3 times daily to reduce pain re: OA   FERROUS SULFATE 325 (65 FE) MG TABLET    Take 325 mg by mouth daily with breakfast.   FLUOCINONIDE CREAM (LIDEX) 0.05 %    Apply 1 application topically 2 (two) times daily. Apply to hands BID as needed   INSULIN GLARGINE (LANTUS SOLOSTAR) 100 UNIT/ML SOLOSTAR PEN    Inject 10 Units into the skin at bedtime.   LATANOPROST (XALATAN) 0.005 % OPHTHALMIC SOLUTION    Place 1 drop into the right eye at bedtime.   LORATADINE (CLARITIN) 10 MG TABLET    Take 10 mg by mouth daily as needed for allergies.   MEMANTINE HCL ER (NAMENDA XR) 28 MG CP24    Take by mouth.   METOPROLOL (TOPROL-XL) 50 MG 24 HR TABLET    Take 50 mg by mouth daily.     OMEPRAZOLE (PRILOSEC) 20 MG CAPSULE    Take 20 mg by mouth 2 (two) times daily before a meal.   PAROXETINE (PAXIL) 20 MG TABLET    Take 20 mg by mouth daily.   POLYETHYLENE GLYCOL (MIRALAX / GLYCOLAX) PACKET    Take 17 g by mouth daily.   POLYVINYL ALCOHOL (LIQUIFILM TEARS) 1.4 % OPHTHALMIC SOLUTION    Place 1 drop into both eyes. One drop four times daily as needed   SENNA (SENOKOT) 8.6 MG TABLET    Take 1 tablet by mouth daily.    TRIAMCINOLONE CREAM (KENALOG) 0.1 %    Apply 1 application topically. Apply thin layer to arms, legs and back as needed  Modified Medications   No medications on file  Discontinued Medications   No medications on file    Physical Exam: Filed Vitals:   01/10/15 1134  BP: 110/68  Pulse: 63  Temp: 97.6 F (36.4 C)  Resp: 18  SpO2: 93%   There is no weight on file to calculate BMI.  Physical Exam  Constitutional: He appears well-developed and well-nourished. No distress.  HENT:  HOH  Cardiovascular: Normal rate, regular rhythm, normal heart sounds and intact distal pulses.   Pulmonary/Chest: Effort  normal and breath sounds normal. No respiratory distress. He has no wheezes. He has no rales.  Abdominal: Soft. Bowel sounds are normal. He exhibits no distension. There is no tenderness.  Musculoskeletal: Normal range of motion. He exhibits no edema or tenderness.  Neurological: He is alert.  Did follow commands, but did not speak to me  Skin: Skin is warm and dry. There is pallor.  Psychiatric:  Could not assess    Labs reviewed: Basic Metabolic Panel:  Recent Labs  05/12/14 07/27/14  09/12/14  NA 141 141 138  K 4.6 4.6 4.8  BUN 31* 26* 28*  CREATININE 1.4* 1.3 1.5*    Liver Function Tests:  Recent Labs  09/12/14  AST 22  ALT 14  ALKPHOS 51    CBC:  Recent Labs  01/25/14 05/12/14 07/27/14 09/12/14  WBC 7.6 7.2 6.7 8.7  HGB 7.8* 10.2* 10.3* 9.9*  HCT 24* 30* 31*  --   PLT 407* 262 243 224    Lab Results  Component Value Date   TSH 2.41 07/27/2014   Lab Results  Component Value Date   HGBA1C 7.0* 10/25/2014   Lab Results  Component Value Date   CHOL 150 09/17/2013   HDL 57 09/17/2013   LDLCALC 72 09/17/2013   TRIG 107 09/17/2013    Patient Care Team: [name], DO as PCP - General (Geriatric Medicine) Raynelle Bring, MD as Consulting Physician (Urology) Jarome Matin, MD as Consulting Physician (Dermatology)  Assessment/Plan 1. Type 2 diabetes mellitus with diabetic chronic kidney disease -last hba1c 7 in April, f/u next month, continues on low dose lantus 10 units at bedtime--consider stopping insulin and using tradjenta only for him if his hba1c remains 7 or less -f/u hba1c in 1 month  2. Alzheimer disease -conts on namenda XR 28mg  daily, paxil for his sexual disinhibition -appears late stage--requires adl assistance  3. Gastroesophageal reflux disease without esophagitis -prilosec had been stopped per pharmacy recs, but he has been having new symptoms and it's had to be restarted  4. Iron deficiency anemia -cont iron supplement, f/u cbc in  1 month  5. Slow transit constipation -cont miralax, senna, and monitor  6. Essential hypertension -bp at goal with toprol alone, low sodium diet  Family/ staff Communication: discussed with CNA  Labs/tests ordered:  Cbc, cmp, hba1c in 1 month  Sotiria Keast L. Kryslyn Helbig, D.O. [facility] [address], [place] [address] Cell Phone (Mon-Fri 8am-5pm):  [phone] On Call:  [phone] & follow prompts after 5pm & weekends Office Phone:  [phone] Office Fax:  [phone]

## 2015-01-09 ENCOUNTER — Non-Acute Institutional Stay (SKILLED_NURSING_FACILITY): Payer: Medicare Other | Admitting: Adult Health

## 2015-01-09 ENCOUNTER — Encounter: Payer: Self-pay | Admitting: Adult Health

## 2015-01-09 DIAGNOSIS — R635 Abnormal weight gain: Secondary | ICD-10-CM | POA: Diagnosis not present

## 2015-01-09 DIAGNOSIS — D509 Iron deficiency anemia, unspecified: Secondary | ICD-10-CM | POA: Diagnosis not present

## 2015-01-09 DIAGNOSIS — E1122 Type 2 diabetes mellitus with diabetic chronic kidney disease: Secondary | ICD-10-CM | POA: Diagnosis not present

## 2015-01-09 DIAGNOSIS — G309 Alzheimer's disease, unspecified: Secondary | ICD-10-CM

## 2015-01-09 DIAGNOSIS — N189 Chronic kidney disease, unspecified: Secondary | ICD-10-CM

## 2015-01-09 DIAGNOSIS — R1314 Dysphagia, pharyngoesophageal phase: Secondary | ICD-10-CM

## 2015-01-09 DIAGNOSIS — K219 Gastro-esophageal reflux disease without esophagitis: Secondary | ICD-10-CM | POA: Diagnosis not present

## 2015-01-09 DIAGNOSIS — K5901 Slow transit constipation: Secondary | ICD-10-CM | POA: Diagnosis not present

## 2015-01-09 DIAGNOSIS — F0280 Dementia in other diseases classified elsewhere without behavioral disturbance: Secondary | ICD-10-CM

## 2015-01-09 NOTE — Progress Notes (Signed)
Patient ID: [name], male   DOB: 01-15-1922, 81 y.o.   MRN: [id number]     Patient Care Team: [name], DO as PCP - General (Geriatric Medicine) Raynelle Bring, MD as Consulting Physician (Urology) Jarome Matin, MD as Consulting Physician (Dermatology)  Nursing Home Location:  [facility]   Code Status:  Advance Directive Documentation        Most Recent Value   Type of Advance Directive  Healthcare Power of Attorney, Living will, Out of facility DNR (pink MOST or yellow form)   Pre-existing out of facility DNR order (yellow form or pink MOST form)  Pink MOST form placed in chart (order not valid for inpatient use), Yellow form placed in chart (order not valid for inpatient use)   "MOST" Form in Place?        Place of Service: SNF (31)  Chief Complaint  Patient presents with  . Medical Management of Chronic Issues    HPI:  81 y.o. male residing at [facility], skilled section. I am here to review his chronic medical issues.  He has a hx of AD, DM2, B12 deficiency, HTN, CAD, Dysphagia, IDA, and prostate cancer.  VS have been stable over the past month. Weight has trended upward, currently at 198 lbs. He is hoyer lifted to the chair and incontinent, however, he remains verbal, able to follow commands and communicate his needs.  The staff reports that he has loose stools without fever and is on senna and miralax.  They also reported a chronically congested, non productive cough. He has a hx of dysphagia and is currently on a mech soft diet with NTL.  Of note he has a hx of heme positive stool with anemia. This was not worked up due to his age. He continues on ferrous sulfate and PPI therapy.     Review of Systems:  Review of Systems  Constitutional: Positive for unexpected weight change. Negative for fever, chills, activity change, appetite change and fatigue.  HENT: Positive for trouble swallowing.   Respiratory: Positive for  cough. Negative for choking, shortness of breath and wheezing.   Cardiovascular: Negative for chest pain, palpitations and leg swelling.  Gastrointestinal: Positive for diarrhea. Negative for nausea, vomiting, abdominal pain, constipation, blood in stool and abdominal distention.  Musculoskeletal: Positive for gait problem.  Neurological: Negative for seizures and speech difficulty.  Psychiatric/Behavioral: Positive for behavioral problems (sexually inappropriate) and confusion.    Medications: Patient's Medications  New Prescriptions   No medications on file  Previous Medications   ACETAMINOPHEN (TYLENOL) 325 MG TABLET    Take 650 mg by mouth 3 (three) times daily.    CHOLECALCIFEROL (VITAMIN D) 2000 UNITS CAPS    Take by mouth.   CYANOCOBALAMIN (VITAMIN B 12 PO)    Take 1,000 mcg by mouth daily.   DEXTROMETHORPHAN (DELSYM) 30 MG/5ML LIQUID    Take by mouth. 1 tsp every 12 hours as needed for cough   DICLOFENAC SODIUM (VOLTAREN) 1 % GEL    Apply 4 g topically 3 (three) times daily. Apply 2grams to each knee 3 times daily to reduce pain re: OA   FERROUS SULFATE 325 (65 FE) MG TABLET    Take 325 mg by mouth daily with breakfast.   FLUOCINONIDE CREAM (LIDEX) 0.05 %    Apply 1 application topically 2 (two) times daily. Apply to hands BID as needed   INSULIN GLARGINE (LANTUS SOLOSTAR) 100 UNIT/ML SOLOSTAR PEN    Inject 10  Units into the skin at bedtime.   LATANOPROST (XALATAN) 0.005 % OPHTHALMIC SOLUTION    Place 1 drop into the right eye at bedtime.   LORATADINE (CLARITIN) 10 MG TABLET    Take 10 mg by mouth daily as needed for allergies.   MEMANTINE HCL ER (NAMENDA XR) 28 MG CP24    Take by mouth.   METOPROLOL (TOPROL-XL) 50 MG 24 HR TABLET    Take 50 mg by mouth daily.     OMEPRAZOLE (PRILOSEC) 20 MG CAPSULE    Take 20 mg by mouth 2 (two) times daily before a meal.   PAROXETINE (PAXIL) 20 MG TABLET    Take 20 mg by mouth daily.   POLYETHYLENE GLYCOL (MIRALAX / GLYCOLAX) PACKET    Take 17 g  by mouth daily.   POLYVINYL ALCOHOL (LIQUIFILM TEARS) 1.4 % OPHTHALMIC SOLUTION    Place 1 drop into both eyes. One drop four times daily as needed   SENNA (SENOKOT) 8.6 MG TABLET    Take 1 tablet by mouth daily.    TRIAMCINOLONE CREAM (KENALOG) 0.1 %    Apply 1 application topically. Apply thin layer to arms, legs and back as needed  Modified Medications   No medications on file  Discontinued Medications   No medications on file     Physical Exam:  Filed Vitals:   01/09/15 1547  BP: 145/75  Pulse: 56  Temp: 96.5 F (35.8 C)  Resp: 16  Weight: 198 lb (89.812 kg)  SpO2: 92%   Wt Readings from Last 3 Encounters:  01/09/15 198 lb (89.812 kg)  11/07/14 193 lb 12.8 oz (87.907 kg)  10/11/14 192 lb (87.091 kg)     Physical Exam  Constitutional: No distress.  Neck: No JVD present.  Cardiovascular: Normal rate and regular rhythm.   No murmur heard. Abdominal: Bowel sounds are normal. He exhibits no distension. There is no tenderness.  Rotund abd  Musculoskeletal: He exhibits no edema or tenderness.  Rigidity noted to both extremities  Lymphadenopathy:    He has no cervical adenopathy.  Neurological: He is alert.  Oriented to self and situation only  Skin: Skin is warm and dry. He is not diaphoretic.  Psychiatric: Affect normal.    Labs reviewed/Significant Diagnostic Results:  Basic Metabolic Panel:  Recent Labs  05/12/14 07/27/14 09/12/14  NA 141 141 138  K 4.6 4.6 4.8  BUN 31* 26* 28*  CREATININE 1.4* 1.3 1.5*   Liver Function Tests:  Recent Labs  09/12/14  AST 22  ALT 14  ALKPHOS 51   No results for input(s): LIPASE, AMYLASE in the last 8760 hours. No results for input(s): AMMONIA in the last 8760 hours. CBC:  Recent Labs  01/25/14 05/12/14 07/27/14 09/12/14  WBC 7.6 7.2 6.7 8.7  HGB 7.8* 10.2* 10.3* 9.9*  HCT 24* 30* 31*  --   PLT 407* 262 243 224   CBG: No results for input(s): GLUCAP in the last 8760 hours. TSH:  Recent Labs  07/27/14    TSH 2.41   A1C: Lab Results  Component Value Date   HGBA1C 7.0* 10/25/2014   11/13/14: Vitamin D 25 Hydroxy-30   Assessment/Plan  1. Type 2 diabetes mellitus with diabetic chronic kidney disease Stable CBGs. Continue Lantus. Check A1C q 6 months. Goal <8%  2. Gastroesophageal reflux disease without esophagitis Stable, continue Prilosec. Failed prior attempts to taper.   3. Alzheimer disease Stable, continue aricept.   4. Weight gain This could possibly be due  to paxil but we will continue it for now as it appears to be helping with his sexually inappropriate behavior. He continues on a heart healthy diet  5. Iron deficiency anemia Continue iron. CBC due in august  6. Constipation Continue Miralax and d/c senna  7. Dysphagia Reports of coughing with meals and in the afternoon without sputum production or fever. Would continue NTL for now and monitor. He is already on a PPI.  No aggressive measures per most form   Cindi Carbon, [place] [phone]

## 2015-01-10 ENCOUNTER — Encounter: Payer: Self-pay | Admitting: Internal Medicine

## 2015-01-25 DIAGNOSIS — R0902 Hypoxemia: Secondary | ICD-10-CM | POA: Diagnosis not present

## 2015-01-25 DIAGNOSIS — R0600 Dyspnea, unspecified: Secondary | ICD-10-CM | POA: Diagnosis not present

## 2015-01-25 DIAGNOSIS — R05 Cough: Secondary | ICD-10-CM | POA: Diagnosis not present

## 2015-01-25 LAB — BASIC METABOLIC PANEL
BUN: 30 mg/dL — AB (ref 4–21)
Creatinine: 1.6 mg/dL — AB (ref 0.6–1.3)
Glucose: 119 mg/dL
Potassium: 5.3 mmol/L (ref 3.4–5.3)
Sodium: 142 mmol/L (ref 137–147)

## 2015-01-25 LAB — CBC AND DIFFERENTIAL
HEMATOCRIT: 32 % — AB (ref 41–53)
Hemoglobin: 10.5 g/dL — AB (ref 13.5–17.5)
PLATELETS: 242 10*3/uL (ref 150–399)
WBC: 6.8 10^3/mL

## 2015-01-26 ENCOUNTER — Non-Acute Institutional Stay (SKILLED_NURSING_FACILITY): Payer: Medicare Other | Admitting: Adult Health

## 2015-01-26 DIAGNOSIS — R1314 Dysphagia, pharyngoesophageal phase: Secondary | ICD-10-CM

## 2015-01-26 DIAGNOSIS — R059 Cough, unspecified: Secondary | ICD-10-CM

## 2015-01-26 DIAGNOSIS — R05 Cough: Secondary | ICD-10-CM | POA: Diagnosis not present

## 2015-01-30 ENCOUNTER — Encounter: Payer: Self-pay | Admitting: Adult Health

## 2015-01-30 NOTE — Progress Notes (Signed)
Patient ID: [name], male   DOB: [birth year], 81 y.o.   MRN: [id number]     Patient Care Team: [name], DO as PCP - General (Geriatric Medicine) Raynelle Bring, MD as Consulting Physician (Urology) Jarome Matin, MD as Consulting Physician (Dermatology)  Nursing Home Location:  [place]   Code Status:  Advance Directive Documentation        Most Recent Value   Type of Advance Directive  Healthcare Power of Attorney, Living will, Out of facility DNR (pink MOST or yellow form)   Pre-existing out of facility DNR order (yellow form or pink MOST form)  Pink MOST form placed in chart (order not valid for inpatient use), Yellow form placed in chart (order not valid for inpatient use)   "MOST" Form in Place?        Place of Service: SNF (31)  Chief Complaint  Patient presents with  . Acute Visit    cough    HPI:  81 y.o. male residing at [facility], skilled section.He has a hx of AD, DM2, B12 deficiency, HTN, CAD, Dysphagia, IDA, and prostate cancer.  I was asked to see him today for a significant cough and wheezing, noted 2 days ago to be worsening but has been a chronic problem for several months. He has a hx of dysphagia and is currently on a mech soft diet with NTL. The cough is not associated with meals. The cough does cause yellow sputum.  He has had normal sats and not required oxygen. A CXR was obtained showing no acute findings.     Review of Systems:  Review of Systems  Constitutional: Negative for fever, chills, activity change, appetite change and fatigue.  HENT: Positive for congestion, postnasal drip, rhinorrhea and trouble swallowing.   Respiratory: Positive for cough. Negative for choking, shortness of breath and wheezing.        Sputum production  Cardiovascular: Negative for chest pain, palpitations and leg swelling.  Musculoskeletal: Positive for gait problem.  Neurological: Negative for seizures and speech  difficulty.  Psychiatric/Behavioral: Positive for behavioral problems (sexually inappropriate) and confusion.    Medications: Patient's Medications  New Prescriptions   No medications on file  Previous Medications   ACETAMINOPHEN (TYLENOL) 325 MG TABLET    Take 650 mg by mouth 3 (three) times daily.    CHOLECALCIFEROL (VITAMIN D) 2000 UNITS CAPS    Take by mouth.   CYANOCOBALAMIN (VITAMIN B 12 PO)    Take 1,000 mcg by mouth daily.   DEXTROMETHORPHAN (DELSYM) 30 MG/5ML LIQUID    Take by mouth. 1 tsp every 12 hours as needed for cough   DICLOFENAC SODIUM (VOLTAREN) 1 % GEL    Apply 4 g topically 3 (three) times daily. Apply 2grams to each knee 3 times daily to reduce pain re: OA   FERROUS SULFATE 325 (65 FE) MG TABLET    Take 325 mg by mouth daily with breakfast.   FLUOCINONIDE CREAM (LIDEX) 0.05 %    Apply 1 application topically 2 (two) times daily. Apply to hands BID as needed   INSULIN GLARGINE (LANTUS SOLOSTAR) 100 UNIT/ML SOLOSTAR PEN    Inject 10 Units into the skin at bedtime.   LATANOPROST (XALATAN) 0.005 % OPHTHALMIC SOLUTION    Place 1 drop into the right eye at bedtime.   LORATADINE (CLARITIN) 10 MG TABLET    Take 10 mg by mouth daily as needed for allergies.   MEMANTINE HCL ER (NAMENDA XR) 28  MG CP24    Take by mouth.   METOPROLOL (TOPROL-XL) 50 MG 24 HR TABLET    Take 50 mg by mouth daily.     OMEPRAZOLE (PRILOSEC) 20 MG CAPSULE    Take 20 mg by mouth 2 (two) times daily before a meal.   PAROXETINE (PAXIL) 20 MG TABLET    Take 20 mg by mouth daily.   POLYETHYLENE GLYCOL (MIRALAX / GLYCOLAX) PACKET    Take 17 g by mouth daily.   POLYVINYL ALCOHOL (LIQUIFILM TEARS) 1.4 % OPHTHALMIC SOLUTION    Place 1 drop into both eyes. One drop four times daily as needed   SENNA (SENOKOT) 8.6 MG TABLET    Take 1 tablet by mouth daily.    TRIAMCINOLONE CREAM (KENALOG) 0.1 %    Apply 1 application topically. Apply thin layer to arms, legs and back as needed  Modified Medications   No  medications on file  Discontinued Medications   No medications on file     Physical Exam:  Filed Vitals:   01/26/15 1148  BP: 108/70  Pulse: 56  Temp: 98.1 F (36.7 C)  Resp: 20  SpO2: 94%   Wt Readings from Last 3 Encounters:  01/09/15 198 lb (89.812 kg)  11/07/14 193 lb 12.8 oz (87.907 kg)  10/11/14 192 lb (87.091 kg)     Physical Exam  Constitutional: No distress.  HENT:  Head: Normocephalic and atraumatic.  Nose: Nose normal.  Mouth/Throat: Oropharynx is clear and moist. No oropharyngeal exudate.  Eyes: Pupils are equal, round, and reactive to light. Right eye exhibits no discharge. Left eye exhibits no discharge.  Neck: No JVD present.  Cardiovascular: Normal rate and regular rhythm.   No murmur heard. Pulmonary/Chest: No respiratory distress. He has no rales.  Rhonchi in the right upper lobe. Significant cough on exam.  Abdominal: Bowel sounds are normal. He exhibits no distension. There is no tenderness.  Rotund abd  Musculoskeletal: He exhibits no edema or tenderness.  Rigidity noted to both extremities  Lymphadenopathy:    He has no cervical adenopathy.  Neurological: He is alert.  Oriented to self and situation only  Skin: Skin is warm and dry. He is not diaphoretic.  Psychiatric: Affect normal.    Labs reviewed/Significant Diagnostic Results:  Basic Metabolic Panel:  Recent Labs  07/27/14 09/12/14 01/25/15  NA 141 138 142  K 4.6 4.8 5.3  BUN 26* 28* 30*  CREATININE 1.3 1.5* 1.6*   Liver Function Tests:  Recent Labs  09/12/14  AST 22  ALT 14  ALKPHOS 51   No results for input(s): LIPASE, AMYLASE in the last 8760 hours. No results for input(s): AMMONIA in the last 8760 hours. CBC:  Recent Labs  05/12/14 07/27/14 09/12/14 01/25/15  WBC 7.2 6.7 8.7 6.8  HGB 10.2* 10.3* 9.9* 10.5*  HCT 30* 31*  --  32*  PLT 262 243 224 242   CBG: No results for input(s): GLUCAP in the last 8760 hours. TSH:  Recent Labs  07/27/14  TSH 2.41    A1C: Lab Results  Component Value Date   HGBA1C 7.0* 10/25/2014   11/13/14: Vitamin D 25 Hydroxy-30   Assessment/Plan  1) Cough  He most likely has aspiration pneumonia vs. Bronchitis. He continues with a significant cough and yellow sputum with rhonchi in the right upper lobe Will begin Augmentin 875 mg BID for 10 days with Florastor for 10 days  2) Dysphagia Continue mech soft diet with NTL, would not change to  honey thick at this time    Cindi Carbon, [place] [phone]

## 2015-02-06 ENCOUNTER — Encounter: Payer: Self-pay | Admitting: Adult Health

## 2015-02-06 ENCOUNTER — Non-Acute Institutional Stay (SKILLED_NURSING_FACILITY): Payer: Medicare Other | Admitting: Adult Health

## 2015-02-06 DIAGNOSIS — F0280 Dementia in other diseases classified elsewhere without behavioral disturbance: Secondary | ICD-10-CM

## 2015-02-06 DIAGNOSIS — J209 Acute bronchitis, unspecified: Secondary | ICD-10-CM | POA: Diagnosis not present

## 2015-02-06 DIAGNOSIS — K219 Gastro-esophageal reflux disease without esophagitis: Secondary | ICD-10-CM | POA: Diagnosis not present

## 2015-02-06 DIAGNOSIS — D509 Iron deficiency anemia, unspecified: Secondary | ICD-10-CM | POA: Diagnosis not present

## 2015-02-06 DIAGNOSIS — I10 Essential (primary) hypertension: Secondary | ICD-10-CM | POA: Diagnosis not present

## 2015-02-06 DIAGNOSIS — N189 Chronic kidney disease, unspecified: Secondary | ICD-10-CM

## 2015-02-06 DIAGNOSIS — R1314 Dysphagia, pharyngoesophageal phase: Secondary | ICD-10-CM

## 2015-02-06 DIAGNOSIS — R635 Abnormal weight gain: Secondary | ICD-10-CM

## 2015-02-06 DIAGNOSIS — E1122 Type 2 diabetes mellitus with diabetic chronic kidney disease: Secondary | ICD-10-CM

## 2015-02-06 DIAGNOSIS — G309 Alzheimer's disease, unspecified: Secondary | ICD-10-CM

## 2015-02-06 DIAGNOSIS — R569 Unspecified convulsions: Secondary | ICD-10-CM

## 2015-02-06 NOTE — Progress Notes (Signed)
Patient ID: [name], male   DOB: 1922-06-06, 81 y.o.   MRN: [id number]     Patient Care Team: [name], DO as PCP - General (Geriatric Medicine) Raynelle Bring, MD as Consulting Physician (Urology) Jarome Matin, MD as Consulting Physician (Dermatology)  Nursing Home Location:  [place]   Code Status:  Advance Directive Documentation        Most Recent Value   Type of Advance Directive  Healthcare Power of Attorney, Living will, Out of facility DNR (pink MOST or yellow form)   Pre-existing out of facility DNR order (yellow form or pink MOST form)  Pink MOST form placed in chart (order not valid for inpatient use), Yellow form placed in chart (order not valid for inpatient use)   "MOST" Form in Place?        Place of Service: SNF (31)  Chief Complaint  Patient presents with  . Medical Management of Chronic Issues    HPI:  81 y.o. male residing at [facility], skilled section. I am here to review his chronic medical issues.  He has a hx of AD, DM2, B12 deficiency, HTN, CAD, Dysphagia, IDA (on iron with heme pos stool), OA, GERD, and prostate cancer.  VS have been stable over the past month. Weight has trended upward over time, currently at 196 lbs. He is hoyer lifted to the chair and incontinent, however, he remains verbal, able to follow commands and communicate his needs.   They also reported a chronically congested, non productive cough. He has a hx of dysphagia and is currently on a mech soft diet with NTL. He was treated for bronchitis on 01/26/15 with 10 days of Augmentin. He is intermittently still coughing with white sputum.   Of note he has a hx of heme positive stool with anemia. This was not worked up due to his age. He continues on ferrous sulfate and PPI therapy.     Review of Systems:  Review of Systems  Constitutional: Negative for fever, chills, activity change, appetite change and fatigue.       Gaining weight    HENT: Positive for trouble swallowing.   Respiratory: Positive for cough. Negative for choking, shortness of breath and wheezing.   Cardiovascular: Negative for chest pain, palpitations and leg swelling.  Gastrointestinal: Negative for nausea, vomiting, abdominal pain, diarrhea, constipation, blood in stool and abdominal distention.  Musculoskeletal: Positive for gait problem.  Neurological: Negative for seizures and speech difficulty.  Psychiatric/Behavioral: Positive for behavioral problems (sexually inappropriate) and confusion.    Medications: Patient's Medications  New Prescriptions   No medications on file  Previous Medications   ACETAMINOPHEN (TYLENOL) 325 MG TABLET    Take 650 mg by mouth 3 (three) times daily.    CHOLECALCIFEROL (VITAMIN D) 2000 UNITS CAPS    Take by mouth.   CYANOCOBALAMIN (VITAMIN B 12 PO)    Take 1,000 mcg by mouth daily.   DEXTROMETHORPHAN (DELSYM) 30 MG/5ML LIQUID    Take by mouth. 1 tsp every 12 hours as needed for cough   DICLOFENAC SODIUM (VOLTAREN) 1 % GEL    Apply 4 g topically 3 (three) times daily. Apply 2grams to each knee 3 times daily to reduce pain re: OA   FERROUS SULFATE 325 (65 FE) MG TABLET    Take 325 mg by mouth daily with breakfast.   FLUOCINONIDE CREAM (LIDEX) 0.05 %    Apply 1 application topically 2 (two) times daily. Apply to hands BID  as needed   INSULIN GLARGINE (LANTUS SOLOSTAR) 100 UNIT/ML SOLOSTAR PEN    Inject 10 Units into the skin at bedtime.   LATANOPROST (XALATAN) 0.005 % OPHTHALMIC SOLUTION    Place 1 drop into the right eye at bedtime.   LORATADINE (CLARITIN) 10 MG TABLET    Take 10 mg by mouth daily as needed for allergies.   MEMANTINE HCL ER (NAMENDA XR) 28 MG CP24    Take by mouth.   METOPROLOL (TOPROL-XL) 50 MG 24 HR TABLET    Take 50 mg by mouth daily.     OMEPRAZOLE (PRILOSEC) 20 MG CAPSULE    Take 20 mg by mouth 2 (two) times daily before a meal.   PAROXETINE (PAXIL) 20 MG TABLET    Take 20 mg by mouth daily.    POLYETHYLENE GLYCOL (MIRALAX / GLYCOLAX) PACKET    Take 17 g by mouth daily.   POLYVINYL ALCOHOL (LIQUIFILM TEARS) 1.4 % OPHTHALMIC SOLUTION    Place 1 drop into both eyes. One drop four times daily as needed   SENNA (SENOKOT) 8.6 MG TABLET    Take 1 tablet by mouth daily.    TRIAMCINOLONE CREAM (KENALOG) 0.1 %    Apply 1 application topically. Apply thin layer to arms, legs and back as needed  Modified Medications   No medications on file  Discontinued Medications   No medications on file     Physical Exam:  Filed Vitals:   02/06/15 1508  BP: 144/84  Pulse: 59  Temp: 97.6 F (36.4 C)  Resp: 16  Weight: 196 lb (88.905 kg)  SpO2: 96%   Wt Readings from Last 3 Encounters:  02/06/15 196 lb (88.905 kg)  01/09/15 198 lb (89.812 kg)  11/07/14 193 lb 12.8 oz (87.907 kg)     Physical Exam  Constitutional: No distress.  Neck: No JVD present.  Cardiovascular: Normal rate and regular rhythm.   No murmur heard. Unable to palpate dorsalis pedis or tibial pulse, bilat  Pulmonary/Chest: Effort normal and breath sounds normal. No respiratory distress. He has no wheezes.  Abdominal: Bowel sounds are normal. He exhibits no distension. There is no tenderness.  Rotund abd  Musculoskeletal: He exhibits no edema or tenderness.  Rigidity noted to both extremities  Lymphadenopathy:    He has no cervical adenopathy.  Neurological: He is alert.  Oriented to self and situation only  Skin: Skin is warm and dry. He is not diaphoretic.  Psychiatric: Affect normal.    Labs reviewed/Significant Diagnostic Results:  Basic Metabolic Panel:  Recent Labs  07/27/14 09/12/14 01/25/15  NA 141 138 142  K 4.6 4.8 5.3  BUN 26* 28* 30*  CREATININE 1.3 1.5* 1.6*   Liver Function Tests:  Recent Labs  09/12/14  AST 22  ALT 14  ALKPHOS 51   No results for input(s): LIPASE, AMYLASE in the last 8760 hours. No results for input(s): AMMONIA in the last 8760 hours. CBC:  Recent Labs  05/12/14  07/27/14 09/12/14 01/25/15  WBC 7.2 6.7 8.7 6.8  HGB 10.2* 10.3* 9.9* 10.5*  HCT 30* 31*  --  32*  PLT 262 243 224 242   CBG: No results for input(s): GLUCAP in the last 8760 hours. TSH:  Recent Labs  07/27/14  TSH 2.41   A1C: Lab Results  Component Value Date   HGBA1C 7.0* 10/25/2014   11/13/14: Vitamin D 25 Hydroxy-30   Assessment/Plan  1. Acute bronchitis, unspecified organism Improved  2. Dysphagia, pharyngoesophageal phase Continue current  diet of mech soft with NTL, if he continues to cough may consider further speech eval but I would not be aggressive due to his age and debility  3. Gastroesophageal reflux disease without esophagitis Continue prilosec. He failed a trial off with heme positive stool  4. Essential hypertension Stable, monitor  BMP periodically  5. Type 2 diabetes mellitus with diabetic chronic kidney disease CBG range 110-132, continue Lantus. Goal A1C <8%  6. Alzheimer disease Stable on namenda MMSE 20/30 with a failed clock on 06/20/14  7. Convulsions, unspecified convulsion type He has a hx of this but there have been no reported events and he is not on meds  8. Iron deficiency anemia Stable, continue iron  Labs due later this week Hgb A1C, CBC, CMP  Cindi Carbon, ANP [facility] [phone]

## 2015-02-09 ENCOUNTER — Encounter: Payer: Self-pay | Admitting: Gastroenterology

## 2015-02-09 DIAGNOSIS — D649 Anemia, unspecified: Secondary | ICD-10-CM | POA: Diagnosis not present

## 2015-02-09 DIAGNOSIS — E0869 Diabetes mellitus due to underlying condition with other specified complication: Secondary | ICD-10-CM | POA: Diagnosis not present

## 2015-02-09 DIAGNOSIS — F0280 Dementia in other diseases classified elsewhere without behavioral disturbance: Secondary | ICD-10-CM | POA: Diagnosis not present

## 2015-02-09 LAB — HEPATIC FUNCTION PANEL
ALT: 12 U/L (ref 10–40)
AST: 13 U/L — AB (ref 14–40)
Alkaline Phosphatase: 65 U/L (ref 25–125)
Bilirubin, Total: 0.4 mg/dL

## 2015-02-09 LAB — BASIC METABOLIC PANEL
BUN: 30 mg/dL — AB (ref 4–21)
CREATININE: 1.5 mg/dL — AB (ref 0.6–1.3)
GLUCOSE: 105 mg/dL
Potassium: 5 mmol/L (ref 3.4–5.3)
SODIUM: 141 mmol/L (ref 137–147)

## 2015-02-09 LAB — CBC AND DIFFERENTIAL
HCT: 29 % — AB (ref 41–53)
HEMOGLOBIN: 9.6 g/dL — AB (ref 13.5–17.5)
Platelets: 225 10*3/uL (ref 150–399)
WBC: 8.7 10*3/mL

## 2015-02-09 LAB — HEMOGLOBIN A1C: Hgb A1c MFr Bld: 6.7 % — AB (ref 4.0–6.0)

## 2015-03-16 ENCOUNTER — Encounter: Payer: Self-pay | Admitting: Adult Health

## 2015-03-16 ENCOUNTER — Non-Acute Institutional Stay (SKILLED_NURSING_FACILITY): Payer: Medicare Other | Admitting: Adult Health

## 2015-03-16 DIAGNOSIS — F0280 Dementia in other diseases classified elsewhere without behavioral disturbance: Secondary | ICD-10-CM

## 2015-03-16 DIAGNOSIS — E1122 Type 2 diabetes mellitus with diabetic chronic kidney disease: Secondary | ICD-10-CM | POA: Diagnosis not present

## 2015-03-16 DIAGNOSIS — R635 Abnormal weight gain: Secondary | ICD-10-CM

## 2015-03-16 DIAGNOSIS — G309 Alzheimer's disease, unspecified: Secondary | ICD-10-CM

## 2015-03-16 DIAGNOSIS — D649 Anemia, unspecified: Secondary | ICD-10-CM

## 2015-03-16 DIAGNOSIS — N189 Chronic kidney disease, unspecified: Secondary | ICD-10-CM

## 2015-03-16 NOTE — Progress Notes (Signed)
Patient ID: [name], male   DOB: [birth year], 81 y.o.   MRN: [id number]     Patient Care Team: [name], DO as PCP - General (Geriatric Medicine) Raynelle Bring, MD as Consulting Physician (Urology) Jarome Matin, MD as Consulting Physician (Dermatology)  Nursing Home Location:  [place]   Code Status:  Advance Directive Documentation        Most Recent Value   Type of Advance Directive  Healthcare Power of Attorney, Living will, Out of facility DNR (pink MOST or yellow form)   Pre-existing out of facility DNR order (yellow form or pink MOST form)  Pink MOST form placed in chart (order not valid for inpatient use), Yellow form placed in chart (order not valid for inpatient use)   "MOST" Form in Place?        Place of Service: SNF (31)  Chief Complaint  Patient presents with  . Medical Management of Chronic Issues    HPI:  81 y.o. male residing at [facility], skilled section. I am here to review his chronic medical issues.  He has a hx of AD, DM2, B12 deficiency, HTN, CAD, Dysphagia, IDA (on iron with heme pos stool), OA, GERD, and prostate cancer.  VS have been stable over the past month. Weight has trended upward over time (7 lbs in 4 mths), currently at 200 lbs. He is hoyer lifted to the chair and incontinent, however, he remains verbal, able to follow commands and communicate his needs.   Of note he has a hx of heme positive stool with anemia in 2015. This was not worked up due to his age. He continues on ferrous sulfate and PPI therapy.  He has no complaints today for our visit.   Last MMSE (06/20/14) score 20/30, failed clock  Review of Systems  Constitutional: Negative for fever and chills.  HENT: Positive for hearing loss.   Respiratory: Negative for cough, sputum production and shortness of breath.   Cardiovascular: Negative for chest pain and leg swelling.  Gastrointestinal: Negative for nausea, vomiting, abdominal  pain, diarrhea and constipation.  Genitourinary: Negative for dysuria.  Musculoskeletal: Negative for myalgias and joint pain.  Neurological: Negative for focal weakness, seizures and loss of consciousness.  Psychiatric/Behavioral: Positive for memory loss. The patient is not nervous/anxious and does not have insomnia.      Medications: Patient's Medications  New Prescriptions   No medications on file  Previous Medications   ACETAMINOPHEN (TYLENOL) 325 MG TABLET    Take 650 mg by mouth 3 (three) times daily.    CHOLECALCIFEROL (VITAMIN D) 2000 UNITS CAPS    Take by mouth.   CYANOCOBALAMIN (VITAMIN B 12 PO)    Take 1,000 mcg by mouth daily.   DEXTROMETHORPHAN (DELSYM) 30 MG/5ML LIQUID    Take by mouth. 1 tsp every 12 hours as needed for cough   DICLOFENAC SODIUM (VOLTAREN) 1 % GEL    Apply 4 g topically 3 (three) times daily. Apply 2grams to each knee 3 times daily to reduce pain re: OA   FERROUS SULFATE 325 (65 FE) MG TABLET    Take 325 mg by mouth daily with breakfast.   FLUOCINONIDE CREAM (LIDEX) 0.05 %    Apply 1 application topically 2 (two) times daily. Apply to hands BID as needed   INSULIN GLARGINE (LANTUS SOLOSTAR) 100 UNIT/ML SOLOSTAR PEN    Inject 10 Units into the skin at bedtime.   LATANOPROST (XALATAN) 0.005 % OPHTHALMIC SOLUTION  Place 1 drop into the right eye at bedtime.   LORATADINE (CLARITIN) 10 MG TABLET    Take 10 mg by mouth daily as needed for allergies.   MEMANTINE HCL ER (NAMENDA XR) 28 MG CP24    Take by mouth.   METOPROLOL (TOPROL-XL) 50 MG 24 HR TABLET    Take 50 mg by mouth daily.     OMEPRAZOLE (PRILOSEC) 20 MG CAPSULE    Take 20 mg by mouth 2 (two) times daily before a meal.   PAROXETINE (PAXIL) 20 MG TABLET    Take 20 mg by mouth daily.   POLYETHYLENE GLYCOL (MIRALAX / GLYCOLAX) PACKET    Take 17 g by mouth daily.   POLYVINYL ALCOHOL (LIQUIFILM TEARS) 1.4 % OPHTHALMIC SOLUTION    Place 1 drop into both eyes. One drop four times daily as needed    TRIAMCINOLONE CREAM (KENALOG) 0.1 %    Apply 1 application topically. Apply thin layer to arms, legs and back as needed  Modified Medications   No medications on file  Discontinued Medications   SENNA (SENOKOT) 8.6 MG TABLET    Take 1 tablet by mouth daily.      Physical Exam:  Filed Vitals:   03/16/15 1542  BP: 135/84  Pulse: 82  Temp: 97 F (36.1 C)  Resp: 22  Weight: 200 lb 6.4 oz (90.901 kg)  SpO2: 91%   Wt Readings from Last 3 Encounters:  03/16/15 200 lb 6.4 oz (90.901 kg)  02/06/15 196 lb (88.905 kg)  01/09/15 198 lb (89.812 kg)     Physical Exam  Constitutional: No distress.  Obese, HOH  Cardiovascular: Normal rate and regular rhythm.   No murmur heard. No edema  Abdominal: Soft. Bowel sounds are normal.  rotund  Musculoskeletal:  Decreased ROM to both knees and hips  Neurological: He is alert.  F/C, MAE, alert to self and situation only. Resting tremor noted  Skin: He is not diaphoretic.  Psychiatric: Affect normal.      Labs reviewed/Significant Diagnostic Results:  Basic Metabolic Panel:  Recent Labs  09/12/14 01/25/15 02/09/15  NA 138 142 141  K 4.8 5.3 5.0  BUN 28* 30* 30*  CREATININE 1.5* 1.6* 1.5*   Liver Function Tests:  Recent Labs  09/12/14 02/09/15  AST 22 13*  ALT 14 12  ALKPHOS 51 65   No results for input(s): LIPASE, AMYLASE in the last 8760 hours. No results for input(s): AMMONIA in the last 8760 hours. CBC:  Recent Labs  07/27/14 09/12/14 01/25/15 02/09/15  WBC 6.7 8.7 6.8 8.7  HGB 10.3* 9.9* 10.5* 9.6*  HCT 31*  --  32* 29*  PLT 243 224 242 225   CBG: No results for input(s): GLUCAP in the last 8760 hours. TSH:  Recent Labs  07/27/14  TSH 2.41   A1C: Lab Results  Component Value Date   HGBA1C 6.7* 02/09/2015   11/13/14: Vitamin D 25 Hydroxy-30   Assessment/Plan  1. Alzheimer disease -stable functional status, continue namenda  2. Anemia, unspecified anemia type -slight trend downward -continue  iron and recheck in 3 mths but no aggressive workup due to goals of care and age -resident has a most form  3. Weight gain -continues to gain weight (nl tsh), currently on paxil for sexually inappropriate behaviors would consider a trial off if his weight does not plateau -weight may improve with lower insulin dosing  4. Type 2 diabetes mellitus with diabetic chronic kidney disease -decrease lantus to 5 units  due fasting # in the 90's. Goal for his age is <8%, he is well below this   Cindi Carbon, [place] [phone]

## 2015-04-17 ENCOUNTER — Non-Acute Institutional Stay (SKILLED_NURSING_FACILITY): Payer: Medicare Other | Admitting: Adult Health

## 2015-04-17 ENCOUNTER — Encounter: Payer: Self-pay | Admitting: Adult Health

## 2015-04-17 DIAGNOSIS — G309 Alzheimer's disease, unspecified: Secondary | ICD-10-CM | POA: Diagnosis not present

## 2015-04-17 DIAGNOSIS — R1314 Dysphagia, pharyngoesophageal phase: Secondary | ICD-10-CM

## 2015-04-17 DIAGNOSIS — R635 Abnormal weight gain: Secondary | ICD-10-CM

## 2015-04-17 DIAGNOSIS — N189 Chronic kidney disease, unspecified: Secondary | ICD-10-CM

## 2015-04-17 DIAGNOSIS — F0280 Dementia in other diseases classified elsewhere without behavioral disturbance: Secondary | ICD-10-CM

## 2015-04-17 DIAGNOSIS — E1122 Type 2 diabetes mellitus with diabetic chronic kidney disease: Secondary | ICD-10-CM
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
## 2015-04-26 DIAGNOSIS — Z23 Encounter for immunization: Secondary | ICD-10-CM | POA: Diagnosis not present

## 2015-05-22 ENCOUNTER — Encounter: Payer: Self-pay | Admitting: Adult Health

## 2015-05-22 ENCOUNTER — Non-Acute Institutional Stay (SKILLED_NURSING_FACILITY): Payer: Medicare Other | Admitting: Adult Health

## 2015-05-22 DIAGNOSIS — D649 Anemia, unspecified: Secondary | ICD-10-CM

## 2015-05-22 DIAGNOSIS — Z794 Long term (current) use of insulin: Secondary | ICD-10-CM

## 2015-05-22 DIAGNOSIS — E1122 Type 2 diabetes mellitus with diabetic chronic kidney disease: Secondary | ICD-10-CM | POA: Diagnosis not present

## 2015-05-22 DIAGNOSIS — R1314 Dysphagia, pharyngoesophageal phase: Secondary | ICD-10-CM | POA: Diagnosis not present

## 2015-05-22 DIAGNOSIS — K219 Gastro-esophageal reflux disease without esophagitis: Secondary | ICD-10-CM | POA: Diagnosis not present

## 2015-05-22 DIAGNOSIS — G309 Alzheimer's disease, unspecified: Secondary | ICD-10-CM | POA: Diagnosis not present

## 2015-05-22 DIAGNOSIS — N183 Chronic kidney disease, stage 3 (moderate): Secondary | ICD-10-CM

## 2015-05-22 DIAGNOSIS — F0280 Dementia in other diseases classified elsewhere without behavioral disturbance: Secondary | ICD-10-CM

## 2015-05-22 NOTE — Progress Notes (Signed)
Patient ID: [name], male   DOB: 16-Jun-1922, 80 y.o.   MRN: [id number]     Patient Care Team: [name], DO as PCP - General (Geriatric Medicine) Raynelle Bring, MD as Consulting Physician (Urology) Jarome Matin, MD as Consulting Physician (Dermatology)  Nursing Home Location:  [place]   Code Status:  Advance Directive Documentation        Most Recent Value   Type of Advance Directive  Healthcare Power of Attorney, Living will, Out of facility DNR (pink MOST or yellow form)   Pre-existing out of facility DNR order (yellow form or pink MOST form)  Pink MOST form placed in chart (order not valid for inpatient use), Yellow form placed in chart (order not valid for inpatient use)   "MOST" Form in Place?        Place of Service: SNF (31)  Chief Complaint  Patient presents with  . Medical Management of Chronic Issues    HPI:  81 y.o. male residing at [facility], skilled section. I am here to review his chronic medical issues.  He has a hx of AD, DM2, B12 deficiency, HTN, CAD, Dysphagia, IDA (on iron with heme pos stool), OA, GERD, and prostate cancer.  VS have been stable over the past month. Weight stable at 196 lbs.He is hoyer lifted to the chair and incontinent, however, he remains verbal, able to follow commands and communicate his needs.   Of note he has a hx of heme positive stool with anemia in 2015. This was not worked up due to his age/debility. He continues on ferrous sulfate and PPI therapy with no overt bleeding and stabilized hgb.  Has has periods of agitation and hitting in the past, none noted in nsg record since our last visit. His blood sugars have ranged 122-149. He has a chronic cough with meals but is without fever or purulent sputum. He remains on a modified diet, family does not wish for feeding tube.    ROS per staff Review of Systems  Constitutional: Negative for fever and chills.  HENT: Positive for  hearing loss. Negative for congestion.   Respiratory: Positive for cough. Negative for sputum production and shortness of breath.   Cardiovascular: Negative for chest pain and leg swelling.  Gastrointestinal: Negative for nausea, vomiting, abdominal pain, diarrhea and constipation.  Genitourinary: Negative for dysuria.  Musculoskeletal: Negative for myalgias and joint pain.  Neurological: Negative for focal weakness, seizures and loss of consciousness.  Psychiatric/Behavioral: Positive for memory loss. The patient is not nervous/anxious and does not have insomnia.        Agitation at times     Medications: Patient's Medications  New Prescriptions   No medications on file  Previous Medications   ACETAMINOPHEN (TYLENOL) 325 MG TABLET    Take 650 mg by mouth 3 (three) times daily.    CHOLECALCIFEROL (VITAMIN D) 2000 UNITS CAPS    Take by mouth.   CYANOCOBALAMIN (VITAMIN B 12 PO)    Take 1,000 mcg by mouth daily.   DEXTROMETHORPHAN (DELSYM) 30 MG/5ML LIQUID    Take by mouth. 1 tsp every 12 hours as needed for cough   DICLOFENAC SODIUM (VOLTAREN) 1 % GEL    Apply 4 g topically 3 (three) times daily. Apply 2grams to each knee 3 times daily to reduce pain re: OA   FERROUS SULFATE 325 (65 FE) MG TABLET    Take 325 mg by mouth daily with breakfast.   FLUOCINONIDE CREAM (LIDEX)  0.05 %    Apply 1 application topically 2 (two) times daily. Apply to hands BID as needed   INSULIN GLARGINE (LANTUS SOLOSTAR) 100 UNIT/ML SOLOSTAR PEN    Inject 10 Units into the skin at bedtime.   LATANOPROST (XALATAN) 0.005 % OPHTHALMIC SOLUTION    Place 1 drop into the right eye at bedtime.   LORATADINE (CLARITIN) 10 MG TABLET    Take 10 mg by mouth daily as needed for allergies.   MEMANTINE HCL ER (NAMENDA XR) 28 MG CP24    Take by mouth.   METOPROLOL (TOPROL-XL) 50 MG 24 HR TABLET    Take 50 mg by mouth daily.     OMEPRAZOLE (PRILOSEC) 20 MG CAPSULE    Take 20 mg by mouth 2 (two) times daily before a meal.    PAROXETINE (PAXIL) 20 MG TABLET    Take 20 mg by mouth daily.   POLYETHYLENE GLYCOL (MIRALAX / GLYCOLAX) PACKET    Take 17 g by mouth daily.   POLYVINYL ALCOHOL (LIQUIFILM TEARS) 1.4 % OPHTHALMIC SOLUTION    Place 1 drop into both eyes. One drop four times daily as needed   TRIAMCINOLONE CREAM (KENALOG) 0.1 %    Apply 1 application topically. Apply thin layer to arms, legs and back as needed  Modified Medications   No medications on file  Discontinued Medications   No medications on file     Physical Exam:  Filed Vitals:   05/22/15 1648  Weight: 196 lb (88.905 kg)   Wt Readings from Last 3 Encounters:  05/22/15 196 lb (88.905 kg)  04/18/15 196 lb 3.2 oz (88.996 kg)  03/16/15 200 lb 6.4 oz (90.901 kg)     Physical Exam  Constitutional: No distress.  Neck: No JVD present.  Cardiovascular: Normal rate and regular rhythm.   No edema  Pulmonary/Chest: Effort normal and breath sounds normal. No respiratory distress.  Abdominal: Soft. Bowel sounds are normal. He exhibits no distension.  rotund  Musculoskeletal:  Decreased ROM to both hips and shoulders  Neurological: He is alert. No cranial nerve deficit.  Oriented x 2  Skin: Skin is warm and dry. He is not diaphoretic.  Psychiatric: Affect normal.  agitated   Pt became combative during assessment and attempted to hit me twice, so the physical exam was deferred.   Labs reviewed/Significant Diagnostic Results:  Basic Metabolic Panel:  Recent Labs  09/12/14 01/25/15 02/09/15  NA 138 142 141  K 4.8 5.3 5.0  BUN 28* 30* 30*  CREATININE 1.5* 1.6* 1.5*   Liver Function Tests:  Recent Labs  09/12/14 02/09/15  AST 22 13*  ALT 14 12  ALKPHOS 51 65   No results for input(s): LIPASE, AMYLASE in the last 8760 hours. No results for input(s): AMMONIA in the last 8760 hours. CBC:  Recent Labs  07/27/14 09/12/14 01/25/15 02/09/15  WBC 6.7 8.7 6.8 8.7  HGB 10.3* 9.9* 10.5* 9.6*  HCT 31*  --  32* 29*  PLT 243 224 242 225    CBG: No results for input(s): GLUCAP in the last 8760 hours. TSH:  Recent Labs  07/27/14  TSH 2.41   A1C: Lab Results  Component Value Date   HGBA1C 6.7* 02/09/2015   11/13/14: Vitamin D 25 Hydroxy-30   Assessment/Plan  1. Alzheimer disease -stable function with combative behaviors at times but no episodes of severe agitation per notes -continue namenda -remains on paxil for sexually inappropriate behaviors, staff states they feel it helps  2. Type  2 diabetes mellitus with diabetic chronic kidney disease -stable blood sugars on 5 units of lantus insulin -Goal A1C <8% due to age  30.Anemia -normocytic normochromic -stable Hgb with ferrous sulfate -continue to monitor CBC, no overt bleeding noted -also on B12 supplementation  4. Dysphagia, pharyngoesophageal phase -continued cough with modified diet -has a most form, no tube feedings -no further intervention indicated -monitor for s/s pna  5. GERD -no s/s -failed previous trial off due to heme pos stools -continue prilosec  Willl check labs next visit  Cindi Carbon, [place] [phone]

## 2015-06-20 ENCOUNTER — Non-Acute Institutional Stay (SKILLED_NURSING_FACILITY): Payer: Medicare Other | Admitting: Internal Medicine

## 2015-06-20 DIAGNOSIS — E1122 Type 2 diabetes mellitus with diabetic chronic kidney disease: Secondary | ICD-10-CM | POA: Diagnosis not present

## 2015-06-20 DIAGNOSIS — I10 Essential (primary) hypertension: Secondary | ICD-10-CM

## 2015-06-20 DIAGNOSIS — F0280 Dementia in other diseases classified elsewhere without behavioral disturbance: Secondary | ICD-10-CM

## 2015-06-20 DIAGNOSIS — N183 Chronic kidney disease, stage 3 unspecified: Secondary | ICD-10-CM

## 2015-06-20 DIAGNOSIS — K5909 Other constipation: Secondary | ICD-10-CM

## 2015-06-20 DIAGNOSIS — H409 Unspecified glaucoma: Secondary | ICD-10-CM | POA: Diagnosis not present

## 2015-06-20 DIAGNOSIS — K5900 Constipation, unspecified: Secondary | ICD-10-CM

## 2015-06-20 DIAGNOSIS — Z794 Long term (current) use of insulin: Secondary | ICD-10-CM | POA: Diagnosis not present

## 2015-06-20 DIAGNOSIS — G309 Alzheimer's disease, unspecified: Secondary | ICD-10-CM

## 2015-06-20 DIAGNOSIS — D509 Iron deficiency anemia, unspecified: Secondary | ICD-10-CM

## 2015-06-21 NOTE — Progress Notes (Signed)
Patient ID: [name], male   DOB: 1921/12/22, 81 y.o.   MRN: [id number]  Location:  [facility] Provider:  Rexene Edison. Mariea Clonts, D.O., C.M.D. Hollace Kinnier, DO  Code Status:  DNR Goals of care: Advanced Directive information    Advanced Directives 01/10/2015  Does patient have an advance directive? Yes  Type of Paramedic of [place];Living will;Out of facility DNR (pink MOST or yellow form)  Does patient want to make changes to advanced directive? No - Patient declined  Copy of advanced directive(s) in chart? Yes  Pre-existing out of facility DNR order (yellow form or pink MOST form) Pink MOST form placed in chart (order not valid for inpatient use)    Chief Complaint  Patient presents with  . Medical Management of Chronic Issues    HPI:  Pt is a 81 y.o. white male long term care resident of [facility] seen today for medical management of chronic diseases.    [name] has a hx of AD, DM2, B12 deficiency, HTN, CAD, Dysphagia, IDA (on iron with heme pos stool), OA, GERD, and prostate cancer.  VS have been stable over the past month. Weight stable.  [name] is hoyer lifted to the chair and incontinent, however, [name] remains verbal, able to follow commands and communicate his needs.   [name] often sits in the hallway.  Of note [name] has a hx of heme positive stool with anemia in 2015. This was not worked up due to his age/debility. [name] continues on ferrous sulfate and PPI therapy with no overt bleeding and stabilized hgb.  Has has periods of agitation and hitting in the past, none noted in nsg record since our last visit. [name]'s also been sexually inappropriate and has paxil for this to decrease this tendency.    His blood sugars have been well controlled. (fasting)  His constipation remains controlled with his current regimen.  [name] is on eye drops for his glaucoma.   Most recent BP elevated, but review of others are at goal.  Review of Systems  Constitutional: Negative for fever,  chills and weight loss.  Eyes:       Glaucoma  Respiratory: Positive for cough. Negative for shortness of breath.   Cardiovascular: Negative for chest pain and leg swelling.  Gastrointestinal: Positive for constipation. Negative for heartburn, abdominal pain, blood in stool and melena.  Genitourinary: Negative for dysuria.  Musculoskeletal: Negative for falls.  Neurological: Negative for dizziness.  Psychiatric/Behavioral: Positive for memory loss.    Past Medical History  Diagnosis Date  . Anemia 2012  . Arthritis   . Diabetes mellitus 1997  . Hyperlipidemia   . Hypertension 2008  . Actinic keratosis   . Macular degeneration (senile) of retina, unspecified   . Epilepsy ([place]) 1943  . Unspecified venous (peripheral) insufficiency   . Glaucoma   . H/O prostate cancer 2011    treated with radiation  . Major depressive disorder, single episode, unspecified ([place])   . Other B-complex deficiencies 2012  . Unspecified vitamin D deficiency 2010  . Reflux esophagitis 2002  . Generalized osteoarthrosis, unspecified site   . Personal history of colonic polyps 2012    adenomatous  . Coronary atherosclerosis of native coronary artery 2002    60% stenois of the 1st diagonal of the LAD  . Type II or unspecified type diabetes mellitus with peripheral circulatory disorders, not stated as uncontrolled(250.70) 05/24/2009    Qualifier: Diagnosis of  By: Caryl Comes, MD, Remus Blake   .  Alzheimer disease 09/13/2008    Qualifier: Diagnosis of  By: Nils Pyle CMA ([place]), Mearl Latin    . History of fall 2013  . Deaf   . 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 Gastric ulcer 2012  . Osteoarthritis of knee 10/07/2013    Qualifier: Diagnosis of  By: Nils Pyle CMA Deborra Medina), Mearl Latin     Past Surgical History  Procedure Laterality Date  . Appendectomy  1947  . Colonoscopy  10/22/10    multiple adenomatous polyps  . Tonsillectomy  1930  . Carpal tunnel release Bilateral  2005  . Myringotomy Right     Allergies  Allergen Reactions  . Aspirin   . Nsaids       Medication List       This list is accurate as of: 06/20/15 11:59 PM.  Always use your most recent med list.               acetaminophen 325 MG tablet  Commonly known as:  TYLENOL  Take 650 mg by mouth 3 (three) times daily.     dextromethorphan 30 MG/5ML liquid  Commonly known as:  DELSYM  Take by mouth. 1 tsp every 12 hours as needed for cough     diclofenac sodium 1 % Gel  Commonly known as:  VOLTAREN  Apply 4 g topically 3 (three) times daily. Apply 2grams to each knee 3 times daily to reduce pain re: OA     ferrous sulfate 325 (65 FE) MG tablet  Take 325 mg by mouth daily with breakfast.     fluocinonide cream 0.05 %  Commonly known as:  LIDEX  Apply 1 application topically 2 (two) times daily. Apply to hands BID as needed     Insulin Glargine 100 UNIT/ML Solostar Pen  Commonly known as:  LANTUS SOLOSTAR  Inject 10 Units into the skin at bedtime.     latanoprost 0.005 % ophthalmic solution  Commonly known as:  XALATAN  Place 1 drop into the right eye at bedtime.     loratadine 10 MG tablet  Commonly known as:  CLARITIN  Take 10 mg by mouth daily as needed for allergies.     metoprolol succinate 50 MG 24 hr tablet  Commonly known as:  TOPROL-XL  Take 50 mg by mouth daily.     NAMENDA XR 28 MG Cp24 24 hr capsule  Generic drug:  memantine  Take by mouth.     omeprazole 20 MG capsule  Commonly known as:  PRILOSEC  Take 20 mg by mouth 2 (two) times daily before a meal.     PARoxetine 20 MG tablet  Commonly known as:  PAXIL  Take 20 mg by mouth daily.     polyethylene glycol packet  Commonly known as:  MIRALAX / GLYCOLAX  Take 17 g by mouth daily.     polyvinyl alcohol 1.4 % ophthalmic solution  Commonly known as:  LIQUIFILM TEARS  Place 1 drop into both eyes. One drop four times daily as needed     triamcinolone cream 0.1 %  Commonly known as:  KENALOG    Apply 1 application topically. Apply thin layer to arms, legs and back as needed     VITAMIN B 12 PO  Take 1,000 mcg by mouth daily.     Vitamin D 2000 units Caps  Take by mouth.        Immunization History  Administered Date(s) Administered  . Influenza-Unspecified  05/21/2013, 04/26/2014, 05/04/2015  . Pneumococcal Polysaccharide-23 10/11/2009  . Td 10/11/2011  . Zoster 10/17/2010   Pertinent  Health Maintenance Due  Topic Date Due  . FOOT EXAM  08/05/1931  . OPHTHALMOLOGY EXAM  08/05/1931  . URINE MICROALBUMIN  08/05/1931  . PNA vac Low Risk Adult (2 of 2 - PCV13) 10/12/2010  . COLONOSCOPY  10/21/2013  . INFLUENZA VACCINE  02/20/2016  . HEMOGLOBIN A1C  02/28/2016   Fall Risk  03/16/2015 11/07/2014  Falls in the past year? No No  Risk for fall due to : History of fall(s) Impaired balance/gait;Impaired mobility;Medication side effect;History of fall(s)    Filed Vitals:   06/20/15 1547  BP: 155/89  Pulse: 65  Temp: 98.2 F (36.8 C)  Resp: 18  Height: 5' 5.5" (1.664 m)  Weight: 198 lb 6.4 oz (89.994 kg)  SpO2: 96%   Body mass index is 32.5 kg/(m^2). Physical Exam  Constitutional: [name] appears well-developed and well-nourished. No distress.  Cardiovascular: Normal rate, regular rhythm and normal heart sounds.   Pulmonary/Chest: Effort normal and breath sounds normal.  Abdominal: Soft. Bowel sounds are normal.  Musculoskeletal: Normal range of motion.  Neurological: [name] is alert.  Skin: Skin is warm and dry.    Labs reviewed:  Recent Labs  01/25/15 02/09/15 08/31/15  NA 142 141 142  K 5.3 5.0 4.8  BUN 30* 30* 31*  CREATININE 1.6* 1.5* 1.7*    Recent Labs  02/09/15  AST 13*  ALT 12  ALKPHOS 65    Recent Labs  01/25/15 02/09/15 08/31/15  WBC 6.8 8.7 7.1  HGB 10.5* 9.6* 10.3*  HCT 32* 29* 31*  PLT 242 225 208   Lab Results  Component Value Date   TSH 2.41 07/27/2014   Lab Results  Component Value Date   HGBA1C 7.5 08/31/2015   HGBA1C 7.5  08/31/2015   Lab Results  Component Value Date   CHOL 150 09/17/2013   HDL 57 09/17/2013   LDLCALC 72 09/17/2013   TRIG 107 09/17/2013    Assessment/Plan 1. Type 2 diabetes mellitus with stage 3 chronic kidney disease, with long-term current use of insulin (HCC) -fasting cbgs at goal with lantus 5 units at hs -no known hypoglycemia  2. Alzheimer disease -see hpi -is on namenda XR for this and paxil for behaviors/disinhibition--no recent outbursts -requires snf level care with hoyer lift  3. Essential hypertension -bps typically well controlled with toprol xl -not on ace/arb due to advanced ckd  4. Iron deficiency anemia -anemia has been stable around 10 with prior heme positive stools so remains on iron supplement -no further workup desired by family due to his advanced dementia and poor functional status  5. Chronic constipation -cont mirlax daily to counteract the iron  6. Glaucoma -cont latanoprost drops for this  Family/ staff Communication: discussed with snf staff  Labs/tests ordered:  No new Mikail Goostree L. Leiah Giannotti, D.O. [facility] [address],  [address] Cell Phone (Mon-Fri 8am-5pm):  [phone] On Call:  [phone] & follow prompts after 5pm & weekends Office Phone:  [phone] Office Fax:  [phone]

## 2015-07-04 NOTE — Progress Notes (Signed)
This encounter was created in error - please disregard.

## 2015-07-07 DIAGNOSIS — F0281 Dementia in other diseases classified elsewhere with behavioral disturbance: Secondary | ICD-10-CM | POA: Diagnosis not present

## 2015-07-07 DIAGNOSIS — R1312 Dysphagia, oropharyngeal phase: Secondary | ICD-10-CM | POA: Diagnosis not present

## 2015-07-10 DIAGNOSIS — R1312 Dysphagia, oropharyngeal phase: Secondary | ICD-10-CM | POA: Diagnosis not present

## 2015-07-10 DIAGNOSIS — F0281 Dementia in other diseases classified elsewhere with behavioral disturbance: Secondary | ICD-10-CM | POA: Diagnosis not present

## 2015-07-11 DIAGNOSIS — F0281 Dementia in other diseases classified elsewhere with behavioral disturbance: Secondary | ICD-10-CM | POA: Diagnosis not present

## 2015-07-11 DIAGNOSIS — R1312 Dysphagia, oropharyngeal phase: Secondary | ICD-10-CM | POA: Diagnosis not present

## 2015-07-12 DIAGNOSIS — F0281 Dementia in other diseases classified elsewhere with behavioral disturbance: Secondary | ICD-10-CM | POA: Diagnosis not present

## 2015-07-12 DIAGNOSIS — R1312 Dysphagia, oropharyngeal phase: Secondary | ICD-10-CM | POA: Diagnosis not present

## 2015-07-17 DIAGNOSIS — R1312 Dysphagia, oropharyngeal phase: Secondary | ICD-10-CM | POA: Diagnosis not present

## 2015-07-17 DIAGNOSIS — F0281 Dementia in other diseases classified elsewhere with behavioral disturbance: Secondary | ICD-10-CM | POA: Diagnosis not present

## 2015-07-21 DIAGNOSIS — F0281 Dementia in other diseases classified elsewhere with behavioral disturbance: Secondary | ICD-10-CM | POA: Diagnosis not present

## 2015-07-21 DIAGNOSIS — R1312 Dysphagia, oropharyngeal phase: Secondary | ICD-10-CM | POA: Diagnosis not present

## 2015-08-04 ENCOUNTER — Non-Acute Institutional Stay (SKILLED_NURSING_FACILITY): Payer: Medicare Other | Admitting: Adult Health

## 2015-08-04 DIAGNOSIS — D509 Iron deficiency anemia, unspecified: Secondary | ICD-10-CM | POA: Diagnosis not present

## 2015-08-04 DIAGNOSIS — N183 Chronic kidney disease, stage 3 unspecified: Secondary | ICD-10-CM

## 2015-08-04 DIAGNOSIS — Z794 Long term (current) use of insulin: Secondary | ICD-10-CM

## 2015-08-04 DIAGNOSIS — R635 Abnormal weight gain: Secondary | ICD-10-CM

## 2015-08-04 DIAGNOSIS — M170 Bilateral primary osteoarthritis of knee: Secondary | ICD-10-CM | POA: Diagnosis not present

## 2015-08-04 DIAGNOSIS — G309 Alzheimer's disease, unspecified: Secondary | ICD-10-CM | POA: Diagnosis not present

## 2015-08-04 DIAGNOSIS — E1122 Type 2 diabetes mellitus with diabetic chronic kidney disease: Secondary | ICD-10-CM | POA: Diagnosis not present

## 2015-08-04 DIAGNOSIS — I10 Essential (primary) hypertension: Secondary | ICD-10-CM | POA: Diagnosis not present

## 2015-08-04 DIAGNOSIS — F0280 Dementia in other diseases classified elsewhere without behavioral disturbance: Secondary | ICD-10-CM

## 2015-08-07 ENCOUNTER — Encounter: Payer: Self-pay | Admitting: Adult Health

## 2015-08-07 DIAGNOSIS — N183 Chronic kidney disease, stage 3 unspecified: Secondary | ICD-10-CM | POA: Insufficient documentation

## 2015-08-07 NOTE — Progress Notes (Signed)
Patient ID: [name], male   DOB: 1922-01-11, 82 y.o.   MRN: [id number]     Patient Care Team: [name], DO as PCP - General (Geriatric Medicine) Raynelle Bring, MD as Consulting Physician (Urology) Jarome Matin, MD as Consulting Physician (Dermatology)  Nursing Home Location:  [facility]   Code Status: DNR  Place of Service:  SNF  Chief Complaint  Patient presents with  . Medical Management of Chronic Issues    HPI:  82 y.o. male residing at [facility], skilled section. I am here to review his chronic medical issues.  He has a hx of AD, DM2, B12 deficiency, HTN, CAD, Dysphagia, IDA (on iron with heme pos stool), OA, GERD, and prostate cancer.  VS have been stable over the past month. Weight trending upward at 199 lbs .He is hoyer lifted to the chair and incontinent, however, he remains verbal, able to follow commands and communicate his needs.  Of note he has a hx of heme positive stool with anemia in 2015. This was not worked up due to his age/debility. He continues on ferrous sulfate and PPI therapy with no overt bleeding and stabilized hgb. Has a hx of OA to both knees and is using tylenol TID, denies pain. Has a hx of HTN, currently on toprol. No elevated readings over 150/90 in records, no SOB, CP, or edema. His blood sugars have ranged 138-195.  ROS per staff Review of Systems  Constitutional: Negative for fever and chills.  HENT: Positive for hearing loss. Negative for congestion.   Respiratory: Positive for cough. Negative for sputum production and shortness of breath.   Cardiovascular: Negative for chest pain and leg swelling.  Gastrointestinal: Negative for nausea, vomiting, abdominal pain, diarrhea and constipation.  Genitourinary: Negative for dysuria.  Musculoskeletal: Positive for joint pain. Negative for myalgias.  Neurological: Negative for focal weakness, seizures and loss of consciousness.    Psychiatric/Behavioral: Positive for memory loss. The patient is not nervous/anxious and does not have insomnia.        Agitation at times     Medications: Patient's Medications  New Prescriptions   No medications on file  Previous Medications   ACETAMINOPHEN (TYLENOL) 325 MG TABLET    Take 650 mg by mouth 3 (three) times daily.    CHOLECALCIFEROL (VITAMIN D) 2000 UNITS CAPS    Take by mouth.   CYANOCOBALAMIN (VITAMIN B 12 PO)    Take 1,000 mcg by mouth daily.   DEXTROMETHORPHAN (DELSYM) 30 MG/5ML LIQUID    Take by mouth. 1 tsp every 12 hours as needed for cough   FERROUS SULFATE 325 (65 FE) MG TABLET    Take 325 mg by mouth daily with breakfast.   FLUOCINONIDE CREAM (LIDEX) 0.05 %    Apply 1 application topically 2 (two) times daily. Apply to hands BID as needed   INSULIN GLARGINE (LANTUS) 100 UNIT/ML INJECTION    Inject 5 Units into the skin at bedtime.   LATANOPROST (XALATAN) 0.005 % OPHTHALMIC SOLUTION    Place 1 drop into the right eye at bedtime.   LORATADINE (CLARITIN) 10 MG TABLET    Take 10 mg by mouth daily as needed for allergies.   MEMANTINE HCL ER (NAMENDA XR) 28 MG CP24    Take by mouth.   METOPROLOL (TOPROL-XL) 50 MG 24 HR TABLET    Take 50 mg by mouth daily.     OMEPRAZOLE (PRILOSEC) 20 MG CAPSULE    Take 20 mg by  mouth 2 (two) times daily before a meal.   PAROXETINE (PAXIL) 20 MG TABLET    Take 20 mg by mouth daily.   POLYETHYLENE GLYCOL (MIRALAX / GLYCOLAX) PACKET    Take 17 g by mouth daily.   POLYVINYL ALCOHOL (LIQUIFILM TEARS) 1.4 % OPHTHALMIC SOLUTION    Place 1 drop into both eyes. One drop four times daily as needed   TRIAMCINOLONE CREAM (KENALOG) 0.1 %    Apply 1 application topically. Apply thin layer to arms, legs and back as needed  Modified Medications   No medications on file  Discontinued Medications   DICLOFENAC SODIUM (VOLTAREN) 1 % GEL    Apply 4 g topically 3 (three) times daily. Apply 2grams to each knee 3 times daily to reduce pain re: OA   INSULIN  GLARGINE (LANTUS SOLOSTAR) 100 UNIT/ML SOLOSTAR PEN    Inject 10 Units into the skin at bedtime.     Physical Exam:  VS reviewed  Filed Vitals:   08/07/15 1624  Weight: 199 lb 8 oz (90.493 kg)   Wt Readings from Last 3 Encounters:  08/07/15 199 lb 8 oz (90.493 kg)  05/22/15 196 lb (88.905 kg)  04/18/15 196 lb 3.2 oz (88.996 kg)     Physical Exam  Constitutional: No distress.  Neck: No JVD present.  Cardiovascular: Normal rate and regular rhythm.   No edema  Pulmonary/Chest: Effort normal and breath sounds normal. No respiratory distress.  Abdominal: Soft. Bowel sounds are normal. He exhibits no distension.  rotund  Musculoskeletal:  Decreased ROM to both hips, knees, and shoulders. Rigid, resists exam  Neurological: He is alert. No cranial nerve deficit.  Oriented x 2  Skin: Skin is warm and dry. He is not diaphoretic.  Psychiatric: Affect normal.     Labs reviewed/Significant Diagnostic Results:  Basic Metabolic Panel:  Recent Labs  09/12/14 01/25/15 02/09/15  NA 138 142 141  K 4.8 5.3 5.0  BUN 28* 30* 30*  CREATININE 1.5* 1.6* 1.5*   Liver Function Tests:  Recent Labs  09/12/14 02/09/15  AST 22 13*  ALT 14 12  ALKPHOS 51 65   No results for input(s): LIPASE, AMYLASE in the last 8760 hours. No results for input(s): AMMONIA in the last 8760 hours. CBC:  Recent Labs  09/12/14 01/25/15 02/09/15  WBC 8.7 6.8 8.7  HGB 9.9* 10.5* 9.6*  HCT  --  32* 29*  PLT 224 242 225   CBG: No results for input(s): GLUCAP in the last 8760 hours. TSH: No results for input(s): TSH in the last 8760 hours. A1C: Lab Results  Component Value Date   HGBA1C 6.7* 02/09/2015   11/13/14: Vitamin D 25 Hydroxy-30   Assessment/Plan  1. Alzheimer disease -stable function with combative behaviors at times but no episodes of severe agitation per notes -continue namenda -remains on paxil for sexually inappropriate behaviors, staff states they feel it helps  2. Type 2  diabetes mellitus with diabetic chronic kidney disease -stable blood sugars on 5 units of lantus insulin -Goal A1C <8% due to age  25.Anemia -normocytic normochromic -stable Hgb with ferrous sulfate -hx of heme pos stools, no work up due to age and debility -continue to monitor CBC, no overt bleeding noted -also on B12 supplementation  4. HTN -controlled, continue current meds -check BMP  5. Weight gain -weight trending upward, needs smaller portions on tray -paxil may be contributing as well  6. Osteoarthritis of both knees, unspecified osteoarthritis type -denies pain today but has  significant rigidity in both legs  -continue tylenol TID  7. CKD stage III -stable, no screening test due to age/debility -check BMP next draw  Labs CBC, BMP, A1C  Cindi Carbon, [facility] [phone]

## 2015-08-08 DIAGNOSIS — D649 Anemia, unspecified: Secondary | ICD-10-CM | POA: Diagnosis not present

## 2015-08-31 DIAGNOSIS — E0869 Diabetes mellitus due to underlying condition with other specified complication: Secondary | ICD-10-CM | POA: Diagnosis not present

## 2015-08-31 LAB — BASIC METABOLIC PANEL
BUN: 31 mg/dL — AB (ref 4–21)
Creatinine: 1.7 mg/dL — AB (ref 0.6–1.3)
Glucose: 133 mg/dL
Potassium: 4.8 mmol/L (ref 3.4–5.3)
SODIUM: 142 mmol/L (ref 137–147)

## 2015-08-31 LAB — HEMOGLOBIN A1C
HEMOGLOBIN A1C: 7.5
Hemoglobin A1C: 7.5

## 2015-08-31 LAB — CBC AND DIFFERENTIAL
HEMATOCRIT: 31 % — AB (ref 41–53)
HEMOGLOBIN: 10.3 g/dL — AB (ref 13.5–17.5)
PLATELETS: 208 10*3/uL (ref 150–399)
WBC: 7.1 10^3/mL

## 2015-09-03 ENCOUNTER — Encounter: Payer: Self-pay | Admitting: Internal Medicine

## 2015-09-04 ENCOUNTER — Non-Acute Institutional Stay (SKILLED_NURSING_FACILITY): Payer: Medicare Other | Admitting: Adult Health

## 2015-09-04 ENCOUNTER — Encounter: Payer: Self-pay | Admitting: Adult Health

## 2015-09-04 DIAGNOSIS — G309 Alzheimer's disease, unspecified: Secondary | ICD-10-CM | POA: Diagnosis not present

## 2015-09-04 DIAGNOSIS — E1122 Type 2 diabetes mellitus with diabetic chronic kidney disease: Secondary | ICD-10-CM

## 2015-09-04 DIAGNOSIS — F0280 Dementia in other diseases classified elsewhere without behavioral disturbance: Secondary | ICD-10-CM

## 2015-09-04 DIAGNOSIS — K219 Gastro-esophageal reflux disease without esophagitis: Secondary | ICD-10-CM

## 2015-09-04 DIAGNOSIS — N183 Chronic kidney disease, stage 3 unspecified: Secondary | ICD-10-CM

## 2015-09-04 DIAGNOSIS — Z794 Long term (current) use of insulin: Secondary | ICD-10-CM | POA: Diagnosis not present

## 2015-09-04 NOTE — Progress Notes (Signed)
Patient ID: [name], male   DOB: 01-31-1922, 82 y.o.   MRN: [id number]     Patient Care Team: [name], DO as PCP - General (Geriatric Medicine) Raynelle Bring, MD as Consulting Physician (Urology) Jarome Matin, MD as Consulting Physician (Dermatology)  Nursing Home Location:  [facility]   Code Status: DNR  Place of Service: SNF (31)  Chief Complaint  Patient presents with  . Medical Management of Chronic Issues    HPI:  82 y.o. male residing at [facility], skilled section. I am here to review his chronic medical issues.  He has a hx of AD, DM2, B12 deficiency, HTN, CAD, Dysphagia, IDA (on iron with heme pos stool), OA, GERD, and prostate cancer.  VS have been stable over the past month. Weight stable at 198 lbs.  Functional status: He is hoyer lifted to the chair and incontinent, however, he remains verbal, able to follow commands and communicate his needs.    1. Type 2 diabetes mellitus with stage 3 chronic kidney disease, with long-term current use of insulin (HCC) -CBG range 145-172 fasting, currently on Lantus 5 units qd  2. Gastroesophageal reflux disease without esophagitis Of note he has a hx of heme positive stool with anemia in 2015. This was not worked up due to his age/debility. He continues on ferrous sulfate and PPI therapy with no overt bleeding and stabilized hgb.  3. Alzheimer disease -advanced, minimally verbal with assistance needed for all ADL's -has periods of agitation and sexually inappropriate behavior -currently on Namenda and Paxil -not able to perform MMSE  4. CKD (chronic kidney disease) stage 3, GFR 30-59 ml/min -BUN/Cr baseline 30/1.5   ROS per staff Review of Systems  Constitutional: Negative for fever and chills.  HENT: Positive for hearing loss. Negative for congestion.   Respiratory: Positive for cough. Negative for sputum production and shortness of breath.   Cardiovascular: Negative  for chest pain and leg swelling.  Gastrointestinal: Negative for nausea, vomiting, abdominal pain, diarrhea and constipation.  Genitourinary: Negative for dysuria.  Musculoskeletal: Positive for joint pain. Negative for myalgias.  Neurological: Negative for focal weakness, seizures and loss of consciousness.  Psychiatric/Behavioral: Positive for memory loss. The patient is not nervous/anxious and does not have insomnia.        Agitation at times     Medications: Patient's Medications  New Prescriptions   No medications on file  Previous Medications   ACETAMINOPHEN (TYLENOL) 325 MG TABLET    Take 650 mg by mouth 3 (three) times daily.    CHOLECALCIFEROL (VITAMIN D) 2000 UNITS CAPS    Take by mouth.   CYANOCOBALAMIN (VITAMIN B 12 PO)    Take 1,000 mcg by mouth daily.   DEXTROMETHORPHAN (DELSYM) 30 MG/5ML LIQUID    Take by mouth. 1 tsp every 12 hours as needed for cough   FERROUS SULFATE 325 (65 FE) MG TABLET    Take 325 mg by mouth daily with breakfast.   FLUOCINONIDE CREAM (LIDEX) 0.05 %    Apply 1 application topically 2 (two) times daily. Apply to hands BID as needed   INSULIN GLARGINE (LANTUS) 100 UNIT/ML INJECTION    Inject 5 Units into the skin at bedtime.   LATANOPROST (XALATAN) 0.005 % OPHTHALMIC SOLUTION    Place 1 drop into the right eye at bedtime.   LORATADINE (CLARITIN) 10 MG TABLET    Take 10 mg by mouth daily as needed for allergies.   MEMANTINE HCL ER (NAMENDA XR)  28 MG CP24    Take by mouth.   METOPROLOL (TOPROL-XL) 50 MG 24 HR TABLET    Take 50 mg by mouth daily.     OMEPRAZOLE (PRILOSEC) 20 MG CAPSULE    Take 20 mg by mouth 2 (two) times daily before a meal.   PAROXETINE (PAXIL) 20 MG TABLET    Take 20 mg by mouth daily.   POLYETHYLENE GLYCOL (MIRALAX / GLYCOLAX) PACKET    Take 17 g by mouth daily.   POLYVINYL ALCOHOL (LIQUIFILM TEARS) 1.4 % OPHTHALMIC SOLUTION    Place 1 drop into both eyes. One drop four times daily as needed   TRIAMCINOLONE CREAM (KENALOG) 0.1 %     Apply 1 application topically. Apply thin layer to arms, legs and back as needed  Modified Medications   No medications on file  Discontinued Medications   No medications on file     Physical Exam:  VS reviewed  There were no vitals filed for this visit. Wt Readings from Last 3 Encounters:  08/07/15 199 lb 8 oz (90.493 kg)  05/22/15 196 lb (88.905 kg)  04/18/15 196 lb 3.2 oz (88.996 kg)     Physical Exam  Constitutional: No distress.  Neck: No JVD present.  Cardiovascular: Normal rate and regular rhythm.   No edema  Pulmonary/Chest: Effort normal and breath sounds normal. No respiratory distress.  Abdominal: Soft. Bowel sounds are normal. He exhibits no distension.  rotund  Musculoskeletal:  Decreased ROM to both hips, knees, and shoulders. Rigid, resists exam  Neurological: He is alert. No cranial nerve deficit.  Oriented x 2  Skin: Skin is warm and dry. He is not diaphoretic.  Psychiatric: Affect normal.     Labs reviewed/Significant Diagnostic Results:  Basic Metabolic Panel:  Recent Labs  01/25/15 02/09/15 08/31/15  NA 142 141 142  K 5.3 5.0 4.8  BUN 30* 30* 31*  CREATININE 1.6* 1.5* 1.7*   Liver Function Tests:  Recent Labs  09/12/14 02/09/15  AST 22 13*  ALT 14 12  ALKPHOS 51 65   No results for input(s): LIPASE, AMYLASE in the last 8760 hours. No results for input(s): AMMONIA in the last 8760 hours. CBC:  Recent Labs  01/25/15 02/09/15 08/31/15  WBC 6.8 8.7 7.1  HGB 10.5* 9.6* 10.3*  HCT 32* 29* 31*  PLT 242 225 208   CBG: No results for input(s): GLUCAP in the last 8760 hours. TSH: No results for input(s): TSH in the last 8760 hours. A1C: Lab Results  Component Value Date   HGBA1C 7.5 08/31/2015   11/13/14: Vitamin D 25 Hydroxy-30   Assessment/Plan  1. Type 2 diabetes mellitus with stage 3 chronic kidney disease, with long-term current use of insulin (HCC) -continue current meds -goal A1C <8 due to age and debility  2.  Gastroesophageal reflux disease without esophagitis -continue protonix, failed previous trial off with dropping Hgb  3. Alzheimer disease -advanced -continue namenda and paxil  4. CKD (chronic kidney disease) stage 3, GFR 30-59 ml/min -stable, continue to monitor BMP    Cindi Carbon, [place] [phone]

## 2015-09-18 ENCOUNTER — Encounter: Payer: Self-pay | Admitting: Adult Health

## 2015-09-18 ENCOUNTER — Non-Acute Institutional Stay (SKILLED_NURSING_FACILITY): Payer: Medicare Other | Admitting: Adult Health

## 2015-09-18 DIAGNOSIS — B356 Tinea cruris: Secondary | ICD-10-CM | POA: Diagnosis not present

## 2015-09-18 NOTE — Progress Notes (Signed)
Patient ID: [name], male   DOB: 10-31-1921, 82 y.o.   MRN: [id number]  Location:  [place]:  SNF (31) Provider: Cindi Carbon NP  Hollace Kinnier, DO  Patient Care Team: [name], DO as PCP - General (Geriatric Medicine) Raynelle Bring, MD as Consulting Physician (Urology) Jarome Matin, MD as Consulting Physician (Dermatology)  Extended Emergency Contact Information Primary Emergency Contact: Willemsen,[name] Address: [address]          [place],  [address] [name] Phone: [phone] Mobile Phone: [phone] Relation: Son  Code Status:  DNR Goals of care: Advanced Directive information Advanced Directives 01/10/2015  Does patient have an advance directive? Yes  Type of Paramedic of [place];Living will;Out of facility DNR (pink MOST or yellow form)  Does patient want to make changes to advanced directive? No - Patient declined  Copy of advanced directive(s) in chart? Yes  Pre-existing out of facility DNR order (yellow form or pink MOST form) Pink MOST form placed in chart (order not valid for inpatient use)     Chief Complaint  Patient presents with  . Acute Visit    penile rash    HPI:  Pt is a 82 y.o. male seen today for an acute visit for rash to the penis, present for two days.  There are no reports of pain or fever.   Past Medical History  Diagnosis Date  . Anemia 2012  . Arthritis   . Diabetes mellitus 1997  . Hyperlipidemia   . Hypertension 2008  . Actinic keratosis   . Macular degeneration (senile) of retina, unspecified   . Epilepsy ([place]) 1943  . Unspecified venous (peripheral) insufficiency   . Glaucoma   . H/O prostate cancer 2011    treated with radiation  . Major depressive disorder, single episode, unspecified ([place])   . Other B-complex deficiencies 2012  . Unspecified vitamin D deficiency 2010  . Reflux esophagitis 2002  . Generalized  osteoarthrosis, unspecified site   . Personal history of colonic polyps 2012    adenomatous  . Coronary atherosclerosis of native coronary artery 2002    60% stenois of the 1st diagonal of the LAD  . Type II or unspecified type diabetes mellitus with peripheral circulatory disorders, not stated as uncontrolled(250.70) 05/24/2009    Qualifier: Diagnosis of  By: Caryl Comes, MD, Remus Blake   . Alzheimer disease 09/13/2008    Qualifier: Diagnosis of  By: Nils Pyle CMA ([place]), Mearl Latin    . History of fall 2013  . Deaf   . 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 Gastric ulcer 2012  . Osteoarthritis of knee 10/07/2013    Qualifier: Diagnosis of  By: Nils Pyle CMA Deborra Medina), Mearl Latin     Past Surgical History  Procedure Laterality Date  . Appendectomy  1947  . Colonoscopy  10/22/10    multiple adenomatous polyps  . Tonsillectomy  1930  . Carpal tunnel release Bilateral 2005  . Myringotomy Right     Allergies  Allergen Reactions  . Aspirin   . Nsaids       Medication List       This list is accurate as of: 09/18/15  2:37 PM.  Always use your most recent med list.               acetaminophen 325 MG tablet  Commonly known as:  TYLENOL  Take 650 mg by mouth 3 (three) times daily.     dextromethorphan 30 MG/5ML liquid  Commonly known as:  DELSYM  Take by mouth. 1 tsp every 12 hours as needed for cough     ferrous sulfate 325 (65 FE) MG tablet  Take 325 mg by mouth daily with breakfast.     fluocinonide cream 0.05 %  Commonly known as:  LIDEX  Apply 1 application topically 2 (two) times daily. Apply to hands BID as needed     insulin glargine 100 UNIT/ML injection  Commonly known as:  LANTUS  Inject 5 Units into the skin at bedtime.     latanoprost 0.005 % ophthalmic solution  Commonly known as:  XALATAN  Place 1 drop into the right eye at bedtime.     loratadine 10 MG tablet  Commonly known as:  CLARITIN  Take 10 mg by mouth daily  as needed for allergies.     metoprolol succinate 50 MG 24 hr tablet  Commonly known as:  TOPROL-XL  Take 50 mg by mouth daily.     NAMENDA XR 28 MG Cp24 24 hr capsule  Generic drug:  memantine  Take by mouth.     omeprazole 20 MG capsule  Commonly known as:  PRILOSEC  Take 20 mg by mouth 2 (two) times daily before a meal.     PARoxetine 20 MG tablet  Commonly known as:  PAXIL  Take 20 mg by mouth daily.     polyethylene glycol packet  Commonly known as:  MIRALAX / GLYCOLAX  Take 17 g by mouth daily.     polyvinyl alcohol 1.4 % ophthalmic solution  Commonly known as:  LIQUIFILM TEARS  Place 1 drop into both eyes. One drop four times daily as needed     triamcinolone cream 0.1 %  Commonly known as:  KENALOG  Apply 1 application topically. Apply thin layer to arms, legs and back as needed     VITAMIN B 12 PO  Take 1,000 mcg by mouth daily.     Vitamin D 2000 units Caps  Take by mouth.        Review of Systems  Genitourinary: Negative for dysuria, flank pain and difficulty urinating.  Skin: Positive for rash. Negative for wound.    Immunization History  Administered Date(s) Administered  . Influenza-Unspecified 05/21/2013, 04/26/2014, 05/04/2015  . Pneumococcal Polysaccharide-23 10/11/2009  . Td 10/11/2011  . Zoster 10/17/2010   Pertinent  Health Maintenance Due  Topic Date Due  . FOOT EXAM  08/05/1931  . OPHTHALMOLOGY EXAM  08/05/1931  . URINE MICROALBUMIN  08/05/1931  . PNA vac Low Risk Adult (2 of 2 - PCV13) 10/12/2010  . COLONOSCOPY  10/21/2013  . INFLUENZA VACCINE  02/20/2016  . HEMOGLOBIN A1C  02/28/2016   Fall Risk  03/16/2015 11/07/2014  Falls in the past year? No No  Risk for fall due to : History of fall(s) Impaired balance/gait;Impaired mobility;Medication side effect;History of fall(s)   Functional Status Survey:    There were no vitals filed for this visit. There is no weight on file to calculate BMI. Physical Exam  Constitutional: No  distress.  Genitourinary: No penile tenderness.  Skin: Rash (noted to meatus and shaft of the penis) noted. He is not diaphoretic.    Labs reviewed:  Recent Labs  01/25/15 02/09/15 08/31/15  NA 142 141 142  K 5.3 5.0 4.8  BUN 30* 30* 31*  CREATININE 1.6* 1.5* 1.7*    Recent Labs  02/09/15  AST 13*  ALT 12  ALKPHOS 65    Recent Labs  01/25/15 02/09/15 08/31/15  WBC 6.8 8.7 7.1  HGB 10.5* 9.6* 10.3*  HCT 32* 29* 31*  PLT 242 225 208   Lab Results  Component Value Date   TSH 2.41 07/27/2014   Lab Results  Component Value Date   HGBA1C 7.5 08/31/2015   Lab Results  Component Value Date   CHOL 150 09/17/2013   HDL 57 09/17/2013   LDLCALC 72 09/17/2013   TRIG 107 09/17/2013    Significant Diagnostic Results in last 30 days:  No results found.  Assessment/Plan  1. Tinea cruris -nystatin BID for 2 weeks -clean penis and foreskin and keep dry daily    Family/ staff Communication: staff  Labs/tests ordered:  None  Cindi Carbon, ANP [facility] [phone]

## 2015-10-02 ENCOUNTER — Encounter: Payer: Self-pay | Admitting: Adult Health

## 2015-10-02 ENCOUNTER — Non-Acute Institutional Stay (SKILLED_NURSING_FACILITY): Payer: Medicare Other | Admitting: Adult Health

## 2015-10-02 DIAGNOSIS — G309 Alzheimer's disease, unspecified: Secondary | ICD-10-CM | POA: Diagnosis not present

## 2015-10-02 DIAGNOSIS — N183 Chronic kidney disease, stage 3 unspecified: Secondary | ICD-10-CM

## 2015-10-02 DIAGNOSIS — F0280 Dementia in other diseases classified elsewhere without behavioral disturbance: Secondary | ICD-10-CM

## 2015-10-02 DIAGNOSIS — Z794 Long term (current) use of insulin: Secondary | ICD-10-CM

## 2015-10-02 DIAGNOSIS — E1122 Type 2 diabetes mellitus with diabetic chronic kidney disease: Secondary | ICD-10-CM | POA: Diagnosis not present

## 2015-10-02 DIAGNOSIS — R1314 Dysphagia, pharyngoesophageal phase: Secondary | ICD-10-CM

## 2015-10-02 DIAGNOSIS — D509 Iron deficiency anemia, unspecified: Secondary | ICD-10-CM

## 2015-10-02 NOTE — Progress Notes (Signed)
Patient ID: [name], male   DOB: 21-Dec-1921, 82 y.o.   MRN: [id number]     Patient Care Team: [name], DO as PCP - General (Geriatric Medicine) Raynelle Bring, MD as Consulting Physician (Urology) Jarome Matin, MD as Consulting Physician (Dermatology)  Nursing Home Location:  [place]   Code Status: DNR  Place of Service: SNF (31)  Chief Complaint  Patient presents with  . Medical Management of Chronic Issues    HPI:  82 y.o. male residing at [facility], skilled section. I am here to review his chronic medical issues.  He has a hx of AD, DM2, B12 deficiency, HTN, CAD, Dysphagia, IDA (on iron with heme pos stool), OA, GERD, and prostate cancer.  VS have been stable over the past month. Weight stable at 198 lbs.  Functional status: He is hoyer lifted to the chair and incontinent, however, he remains verbal, able to follow commands and communicate his needs.   1. Alzheimer disease He has advanced stage Alzheimer's Dx. He is frequently incontinent of bowel and stool. His eating is supervised. Not able to perform cognitive testing due to advanced stage of Alzheimer's. He is taking Namenda for Alzheimer's and Paxil for hypersexual behavior.   2. Type 2 diabetes mellitus with stage 3 chronic kidney disease, with long-term current use of insulin ([place]) His last A1C was 7.5 on 08/31/2015. He has morning blood glucose checks with ranges between 133-243. He takes Lantus 5 U at bedtime.   3. CKD (chronic kidney disease) stage 3, GFR 30-59 ml/min His last BUN was 31, creatinine 1.7 and creatinine clearance 31 on 08/29/2015.   4. Iron deficiency anemia His last Hgb was 10.3 and hct 31.0 on 08/08/2015. He has had no bloody stools per nursing staff. Currently on ferrous sulfate, no GI upset.   5. Dysphagia, pharyngoesophageal phase He is currently on a mechanical soft diet with ground meats and thickened liquid. No reports of coughing or choking  episodes, however, he does has had a chronic congested cough in the past. No recent bouts of pna.   ROS per staff Review of Systems  Unable to perform ROS: dementia  Constitutional: Negative for fever and chills.  HENT: Positive for hearing loss. Negative for congestion.   Respiratory: Positive for cough. Negative for sputum production and shortness of breath.   Cardiovascular: Negative for chest pain and leg swelling.  Gastrointestinal: Negative for nausea, vomiting, abdominal pain, diarrhea and constipation.  Genitourinary: Negative for dysuria.  Musculoskeletal: Positive for joint pain. Negative for myalgias.  Neurological: Negative for focal weakness, seizures and loss of consciousness.  Psychiatric/Behavioral: Positive for memory loss. The patient is not nervous/anxious and does not have insomnia.        Agitation at times     Medications: Patient's Medications  New Prescriptions   No medications on file  Previous Medications   ACETAMINOPHEN (TYLENOL) 325 MG TABLET    Take 650 mg by mouth 3 (three) times daily.    CHOLECALCIFEROL (VITAMIN D) 2000 UNITS CAPS    Take by mouth.   CYANOCOBALAMIN (VITAMIN B 12 PO)    Take 1,000 mcg by mouth daily.   DEXTROMETHORPHAN (DELSYM) 30 MG/5ML LIQUID    Take by mouth. 1 tsp every 12 hours as needed for cough   FERROUS SULFATE 325 (65 FE) MG TABLET    Take 325 mg by mouth daily with breakfast.   FLUOCINONIDE CREAM (LIDEX) 0.05 %    Apply 1 application topically  2 (two) times daily. Apply to hands BID as needed   INSULIN GLARGINE (LANTUS) 100 UNIT/ML INJECTION    Inject 5 Units into the skin at bedtime.   LATANOPROST (XALATAN) 0.005 % OPHTHALMIC SOLUTION    Place 1 drop into the right eye at bedtime.   LORATADINE (CLARITIN) 10 MG TABLET    Take 10 mg by mouth daily as needed for allergies.   MEMANTINE HCL ER (NAMENDA XR) 28 MG CP24    Take by mouth.   METOPROLOL (TOPROL-XL) 50 MG 24 HR TABLET    Take 50 mg by mouth daily.     OMEPRAZOLE  (PRILOSEC) 20 MG CAPSULE    Take 20 mg by mouth 2 (two) times daily before a meal.   PAROXETINE (PAXIL) 20 MG TABLET    Take 20 mg by mouth daily.   POLYETHYLENE GLYCOL (MIRALAX / GLYCOLAX) PACKET    Take 17 g by mouth daily.   POLYVINYL ALCOHOL (LIQUIFILM TEARS) 1.4 % OPHTHALMIC SOLUTION    Place 1 drop into both eyes. One drop four times daily as needed   TRIAMCINOLONE CREAM (KENALOG) 0.1 %    Apply 1 application topically. Apply thin layer to arms, legs and back as needed  Modified Medications   No medications on file  Discontinued Medications   No medications on file     Physical Exam:  VS reviewed  Filed Vitals:   10/02/15 1537  Weight: 198 lb 12.8 oz (90.175 kg)   Wt Readings from Last 3 Encounters:  10/02/15 198 lb 12.8 oz (90.175 kg)  09/13/15 198 lb 12.8 oz (90.175 kg)  08/07/15 199 lb 8 oz (90.493 kg)     Physical Exam  Constitutional: He is well-developed, well-nourished, and in no distress. No distress.  HENT:  Head: Normocephalic and atraumatic.  Eyes: Right eye exhibits no discharge. Left eye exhibits no discharge.  Neck: No JVD present.  Cardiovascular: Normal rate, regular rhythm and normal heart sounds.   No edema  Pulmonary/Chest: Effort normal and breath sounds normal. No respiratory distress.  Abdominal: Soft. Bowel sounds are normal. He exhibits no distension.  rotund  Musculoskeletal: He exhibits no edema or tenderness.  Decreased ROM to both hips, knees, and shoulders.   Neurological: He is alert. No cranial nerve deficit.  Oriented x 2  Skin: Skin is warm and dry. He is not diaphoretic.  Psychiatric: Affect normal.  Nursing note and vitals reviewed.    Labs reviewed/Significant Diagnostic Results:  Basic Metabolic Panel:  Recent Labs  01/25/15 02/09/15 08/31/15  NA 142 141 142  K 5.3 5.0 4.8  BUN 30* 30* 31*  CREATININE 1.6* 1.5* 1.7*   Liver Function Tests:  Recent Labs  02/09/15  AST 13*  ALT 12  ALKPHOS 65   No results for  input(s): LIPASE, AMYLASE in the last 8760 hours. No results for input(s): AMMONIA in the last 8760 hours. CBC:  Recent Labs  01/25/15 02/09/15 08/31/15  WBC 6.8 8.7 7.1  HGB 10.5* 9.6* 10.3*  HCT 32* 29* 31*  PLT 242 225 208   CBG: No results for input(s): GLUCAP in the last 8760 hours. TSH: No results for input(s): TSH in the last 8760 hours. A1C: Lab Results  Component Value Date   HGBA1C 7.5 08/31/2015   11/13/14: Vitamin D 25 Hydroxy-30   Assessment/Plan  1. Alzheimer disease -Advanced stage Alzheimer's disease -Continue Namenda and Paxil  2. Type 2 diabetes mellitus with stage 3 chronic kidney disease, with long-term current use  of insulin ([place]) -Stable. Controlled.  -Continue Lantus 5 U at bedtime.  -Continue morning blood glucose checks, if A1C continues to rise above 8% will address  3. CKD (chronic kidney disease) stage 3, GFR 30-59 ml/min - Stage 3 CKD stable.  - Repeat BUN, creatinine in 6 months.  4. Iron deficiency anemia - Stable - Repeat CBC in 6 months if no acute bleeding occurs  5. Dysphagia, pharyngoesophageal phase - Stable - Continue mechanical soft diet with ground meat and thickened liquids  Laurin Coder, DNP student  Cindi Carbon, ANP [facility] [phone]

## 2015-10-03 DIAGNOSIS — N183 Chronic kidney disease, stage 3 unspecified: Secondary | ICD-10-CM | POA: Insufficient documentation

## 2015-10-03 DIAGNOSIS — H409 Unspecified glaucoma: Secondary | ICD-10-CM | POA: Insufficient documentation

## 2015-10-03 DIAGNOSIS — K5909 Other constipation: Secondary | ICD-10-CM | POA: Insufficient documentation

## 2015-10-03 DIAGNOSIS — Z794 Long term (current) use of insulin: Secondary | ICD-10-CM | POA: Insufficient documentation

## 2015-10-03 DIAGNOSIS — E1122 Type 2 diabetes mellitus with diabetic chronic kidney disease: Secondary | ICD-10-CM | POA: Insufficient documentation

## 2015-10-24 ENCOUNTER — Non-Acute Institutional Stay (SKILLED_NURSING_FACILITY): Payer: Medicare Other | Admitting: Internal Medicine

## 2015-10-24 ENCOUNTER — Encounter: Payer: Self-pay | Admitting: Internal Medicine

## 2015-10-24 DIAGNOSIS — J690 Pneumonitis due to inhalation of food and vomit: Secondary | ICD-10-CM

## 2015-10-24 DIAGNOSIS — R1314 Dysphagia, pharyngoesophageal phase: Secondary | ICD-10-CM | POA: Diagnosis not present

## 2015-10-24 DIAGNOSIS — R6889 Other general symptoms and signs: Secondary | ICD-10-CM | POA: Diagnosis not present

## 2015-10-24 NOTE — Progress Notes (Signed)
Patient ID: [name], male   DOB: [birth year], 82 y.o.   MRN: [id number]  Location:  [facility] Room Number: 127 Place of Service:  SNF (31)  Dale Leverett, DO  Patient Care Team: Dale Curry, DO as PCP - General (Geriatric Medicine) Dale Bring, MD as Consulting Physician (Urology) Dale Matin, MD as Consulting Physician (Dermatology)  Extended Emergency Contact Information Primary Emergency Contact: [name] Address: [address]          [place], [place] [address] [name] of [place] Phone: [phone] Mobile Phone: [phone] Relation: Son  Code Status:  DNR Goals of care: Advanced Directive information Advanced Directives 10/24/2015  Does patient have an advance directive? Yes  Type of Paramedic of [place];Living will;Out of facility DNR (pink MOST or yellow form)  Copy of advanced directive(s) in chart? Yes  Pre-existing out of facility DNR order (yellow form or pink MOST form) Yellow form placed in chart (order not valid for inpatient use);Pink MOST form placed in chart (order not valid for inpatient use)   Chief Complaint  Patient presents with  . Acute Visit    cough with yellow sputum, temp 100, POX 90%RA, crackles and diminished breath sounds, CXR pending    HPI:  Pt is a 82 y.o. male with h/o DMII, AD with behaviors, CKDIII, BPH with LUTS, dysphagia with aspiration, CHF seen today for an acute visit for cough, fever of 100 last night, now 99, sputum production, sats 90% RA, decreased breath sounds with crackles at the bases.  His chest xray is pending.     Past Medical History  Diagnosis Date  . Anemia 2012  . Arthritis   . Diabetes mellitus 1997  . Hyperlipidemia   . Hypertension 2008  . Actinic keratosis   . Macular degeneration (senile) of retina, unspecified   . Epilepsy ([place]) 1943  . Unspecified venous (peripheral) insufficiency   . Glaucoma   . H/O prostate cancer  2011    treated with radiation  . Major depressive disorder, single episode, unspecified ([place])   . Other B-complex deficiencies 2012  . Unspecified vitamin D deficiency 2010  . Reflux esophagitis 2002  . Generalized osteoarthrosis, unspecified site   . Personal history of colonic polyps 2012    adenomatous  . Coronary atherosclerosis of native coronary artery 2002    60% stenois of the 1st diagonal of the LAD  . Type II or unspecified type diabetes mellitus with peripheral circulatory disorders, not stated as uncontrolled(250.70) 05/24/2009    Qualifier: Diagnosis of  By: Caryl Comes, MD, Remus Blake   . Alzheimer disease 09/13/2008    Qualifier: Diagnosis of  By: Nils Pyle CMA ([place]), Mearl Latin    . History of fall 2013  . Deaf   . 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 Gastric ulcer 2012  . Osteoarthritis of knee 10/07/2013    Qualifier: Diagnosis of  By: Nils Pyle CMA Deborra Medina), Mearl Latin     Past Surgical History  Procedure Laterality Date  . Appendectomy  1947  . Colonoscopy  10/22/10    multiple adenomatous polyps  . Tonsillectomy  1930  . Carpal tunnel release Bilateral 2005  . Myringotomy Right     Allergies  Allergen Reactions  . Aspirin   . Nsaids       Medication List       This list is accurate as of: 10/24/15 11:59 PM.  Always  use your most recent med list.               acetaminophen 325 MG tablet  Commonly known as:  TYLENOL  Take 650 mg by mouth 3 (three) times daily.     dextromethorphan 30 MG/5ML liquid  Commonly known as:  DELSYM  Take by mouth. 1 tsp every 12 hours as needed for cough     ferrous sulfate 325 (65 FE) MG tablet  Take 325 mg by mouth daily with breakfast.     fluocinonide cream 0.05 %  Commonly known as:  LIDEX  Apply 1 application topically 2 (two) times daily. Apply to hands BID as needed     insulin glargine 100 UNIT/ML injection  Commonly known as:  LANTUS  Inject 5 Units into the skin at  bedtime.     latanoprost 0.005 % ophthalmic solution  Commonly known as:  XALATAN  Place 1 drop into the right eye at bedtime.     loratadine 10 MG tablet  Commonly known as:  CLARITIN  Take 10 mg by mouth daily.     metoprolol succinate 50 MG 24 hr tablet  Commonly known as:  TOPROL-XL  Take 50 mg by mouth daily.     NAMENDA XR 28 MG Cp24 24 hr capsule  Generic drug:  memantine  Take by mouth.     omeprazole 20 MG capsule  Commonly known as:  PRILOSEC  Take 20 mg by mouth 2 (two) times daily before a meal.     PARoxetine 20 MG tablet  Commonly known as:  PAXIL  Take 20 mg by mouth daily.     polyethylene glycol packet  Commonly known as:  MIRALAX / GLYCOLAX  Take 17 g by mouth daily.     polyvinyl alcohol 1.4 % ophthalmic solution  Commonly known as:  LIQUIFILM TEARS  Place 1 drop into both eyes. One drop four times daily as needed     triamcinolone cream 0.1 %  Commonly known as:  KENALOG  Apply 1 application topically. Apply thin layer to arms, legs and back as needed     VITAMIN B 12 PO  Take 1,000 mcg by mouth daily.     Vitamin D 2000 units Caps  Take by mouth.        Review of Systems  Constitutional: Positive for fever, chills, activity change, appetite change and fatigue. Negative for diaphoresis and unexpected weight change.  HENT: Positive for congestion and hearing loss. Negative for sneezing and sore throat.   Eyes: Negative for redness.       Glasses  Respiratory: Positive for cough and shortness of breath. Negative for chest tightness.   Cardiovascular: Positive for leg swelling. Negative for chest pain.       Improved with elevation of his feet  Gastrointestinal: Negative for abdominal pain and abdominal distention.  Genitourinary:       BPH with urinary retention  Neurological: Negative for dizziness.  Psychiatric/Behavioral: Positive for confusion and decreased concentration.       Lethargy    Immunization History  Administered Date(s)  Administered  . Influenza-Unspecified 05/21/2013, 04/26/2014, 05/04/2015  . Pneumococcal Polysaccharide-23 10/11/2009  . Td 10/11/2011  . Zoster 10/17/2010   Pertinent  Health Maintenance Due  Topic Date Due  . FOOT EXAM  08/05/1931  . OPHTHALMOLOGY EXAM  08/05/1931  . URINE MICROALBUMIN  08/05/1931  . PNA vac Low Risk Adult (2 of 2 - PCV13) 10/12/2010  . COLONOSCOPY  10/21/2013  .  INFLUENZA VACCINE  02/20/2016  . HEMOGLOBIN A1C  02/28/2016   Fall Risk  03/16/2015 11/07/2014  Falls in the past year? No No  Risk for fall due to : History of fall(s) Impaired balance/gait;Impaired mobility;Medication side effect;History of fall(s)   Functional Status Survey: Is the patient deaf or have difficulty hearing?: Yes Does the patient have difficulty seeing, even when wearing glasses/contacts?: No Does the patient have difficulty concentrating, remembering, or making decisions?: Yes Does the patient have difficulty walking or climbing stairs?: Yes Does the patient have difficulty dressing or bathing?: Yes Does the patient have difficulty doing errands alone such as visiting a doctor's office or shopping?: Yes  Filed Vitals:   10/24/15 1338  BP: 136/77  Pulse: 72  Temp: 99 F (37.2 C)  Resp: 16  Height: 5' 5.5" (1.664 m)  Weight: 201 lb (91.173 kg)  SpO2: 90%   Body mass index is 32.93 kg/(m^2). Physical Exam  Constitutional: No distress.  Drowsy white male resting in recliner chair, wearing oxygen, sats up to mid 90s  HENT:  Head: Normocephalic and atraumatic.  Cardiovascular: Normal rate and regular rhythm.   Chronic edema of bilateral LEs  Pulmonary/Chest:  Diminished breath sounds with coarse rhonchi and expiratory wheezes in other areas  Abdominal: Soft. Bowel sounds are normal. He exhibits no distension.  Musculoskeletal: Normal range of motion.  Neurological:  Drowsy, but arousable  Skin: Skin is warm and dry.    Labs reviewed:  Recent Labs  01/25/15 02/09/15  08/31/15  NA 142 141 142  K 5.3 5.0 4.8  BUN 30* 30* 31*  CREATININE 1.6* 1.5* 1.7*    Recent Labs  02/09/15  AST 13*  ALT 12  ALKPHOS 65    Recent Labs  01/25/15 02/09/15 08/31/15  WBC 6.8 8.7 7.1  HGB 10.5* 9.6* 10.3*  HCT 32* 29* 31*  PLT 242 225 208   Lab Results  Component Value Date   TSH 2.41 07/27/2014   Lab Results  Component Value Date   HGBA1C 7.5 08/31/2015   HGBA1C 7.5 08/31/2015   Lab Results  Component Value Date   CHOL 150 09/17/2013   HDL 57 09/17/2013   LDLCALC 72 09/17/2013   TRIG 107 09/17/2013    Significant Diagnostic Results in last 30 days:  pCXR 2 view pending  Assessment/Plan 1. Aspiration pneumonia, unspecified aspiration pneumonia type, unspecified laterality, unspecified part of lung ([place]) -suspected, but await cxr -for now push thickened water for 72 hrs -elevate hob/chair, schedule duonebs every 6 hrs while awake for 1 wk -would use augmentin if CXR shows pneumonia  2. Dysphagia, pharyngoesophageal phase -aspiration precautions, thickened water  Family/ staff Communication: discussed with his wife and snf nurses  Labs/tests ordered:  pcxr 2 view; if unclear, check cbc, bmp also

## 2015-10-26 ENCOUNTER — Encounter: Payer: Self-pay | Admitting: Adult Health

## 2015-10-26 ENCOUNTER — Non-Acute Institutional Stay (SKILLED_NURSING_FACILITY): Payer: Medicare Other | Admitting: Adult Health

## 2015-10-26 DIAGNOSIS — J209 Acute bronchitis, unspecified: Secondary | ICD-10-CM | POA: Diagnosis not present

## 2015-10-26 DIAGNOSIS — H66006 Acute suppurative otitis media without spontaneous rupture of ear drum, recurrent, bilateral: Secondary | ICD-10-CM

## 2015-10-26 DIAGNOSIS — Z794 Long term (current) use of insulin: Secondary | ICD-10-CM

## 2015-10-26 DIAGNOSIS — E1165 Type 2 diabetes mellitus with hyperglycemia: Secondary | ICD-10-CM

## 2015-10-26 DIAGNOSIS — E118 Type 2 diabetes mellitus with unspecified complications: Secondary | ICD-10-CM | POA: Diagnosis not present

## 2015-10-26 DIAGNOSIS — IMO0002 Reserved for concepts with insufficient information to code with codable children: Secondary | ICD-10-CM

## 2015-10-26 NOTE — Progress Notes (Signed)
Patient ID: [name], male   DOB: 27-Jan-1922, 82 y.o.   MRN: [id number]   Location:  Occupational psychologist of Service:  SNF (31) Provider:   Patient Care Team: Dale Curry, DO as PCP - General (Geriatric Medicine) Dale Bring, MD as Consulting Physician (Urology) Dale Matin, MD as Consulting Physician (Dermatology)  Extended Emergency Contact Information Primary Emergency Contact: [name] Address: [address]          [place], [place] [address] [name] of [place] Phone: [phone] Mobile Phone: [phone] Relation: Son  Code Status: DNR Goals of Care: Advanced Directive information Advanced Directives 10/24/2015  Does patient have an advance directive? Yes  Type of Paramedic of [place];Living will;Out of facility DNR (pink MOST or yellow form)  Copy of advanced directive(s) in chart? Yes  Pre-existing out of facility DNR order (yellow form or pink MOST form) Yellow form placed in chart (order not valid for inpatient use);Pink MOST form placed in chart (order not valid for inpatient use)     Chief Complaint  Patient presents with  . Acute Visit    purulent drainage from ear, eyes, decreased sats    HPI: Patient is a 82 y.o. male seen in today for drainage to both eyes, both ears, and decreased 02 sats 85%.  He was seen on 4/4 for cough and congestion. A CXR was ordered with no acute findings. He has a hx of TM rupture with recurrent otitis. He also has dysphagia and is at risk for aspiration. The staff reports that his blood sugars have been over 200.  He has been combative at times due to his dementia. The RN has noted a significant amt of purulent matter from his ears, dripping down his cheek and soiling his shirt from both ears.  He also has purulent matter in both eyes. He has no observed resp distress but his sats are 85% on RA.  The nurse was not able to use oxygen due to his combativeness.       Health Maintenance  Topic Date Due  . FOOT EXAM  08/05/1931  . OPHTHALMOLOGY EXAM  08/05/1933  . URINE MICROALBUMIN  08/05/1933  . PNA vac Low Risk Adult (2 of 2 - PCV13) 10/12/2010  . COLONOSCOPY  10/21/2013  . INFLUENZA VACCINE  02/20/2016  . HEMOGLOBIN A1C  02/28/2016  . TETANUS/TDAP  10/10/2021  . ZOSTAVAX  Completed      Past Medical History  Diagnosis Date  . Anemia 2012  . Arthritis   . Diabetes mellitus 1997  . Hyperlipidemia   . Hypertension 2008  . Actinic keratosis   . Macular degeneration (senile) of retina, unspecified   . Epilepsy ([place]) 1943  . Unspecified venous (peripheral) insufficiency   . Glaucoma   . H/O prostate cancer 2011    treated with radiation  . Major depressive disorder, single episode, unspecified ([place])   . Other B-complex deficiencies 2012  . Unspecified vitamin D deficiency 2010  . Reflux esophagitis 2002  . Generalized osteoarthrosis, unspecified site   . Personal history of colonic polyps 2012    adenomatous  . Coronary atherosclerosis of native coronary artery 2002    60% stenois of the 1st diagonal of the LAD  . Type II or unspecified type diabetes mellitus with peripheral circulatory disorders, not stated as uncontrolled(250.70) 05/24/2009    Qualifier: Diagnosis of  By: Caryl Comes, MD, Remus Blake   . Alzheimer disease 09/13/2008  Qualifier: Diagnosis of  By: Nils Pyle CMA ([place]), Mearl Latin    . History of fall 2013  . Deaf   . 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 Gastric ulcer 2012  . Osteoarthritis of knee 10/07/2013    Qualifier: Diagnosis of  By: Nils Pyle CMA Deborra Medina), Mearl Latin      Past Surgical History  Procedure Laterality Date  . Appendectomy  1947  . Colonoscopy  10/22/10    multiple adenomatous polyps  . Tonsillectomy  1930  . Carpal tunnel release Bilateral 2005  . Myringotomy Right     The patient has a family history of  shoulder Social History   Social  History  . Marital Status: Married    Spouse Name: N/A  . Number of Children: N/A  . Years of Education: N/A   Occupational History  . Not on file.   Social History Main Topics  . Smoking status: Former Smoker    Types: Cigarettes    Quit date: 10/22/1963  . Smokeless tobacco: Not on file  . Alcohol Use: No  . Drug Use: No  . Sexual Activity: No   Other Topics Concern  . Not on file   Social History Narrative   Patient is Married, [name].  Retired Human resources officer. Lives in  SNF at [employer]; in this retirement community since 2004   Quit [place], no alcohol history   Patient has  Advanced planning documents: Living Will, DNR, MOST form          Allergies  Allergen Reactions  . Aspirin   . Nsaids       Medication List       This list is accurate as of: 10/26/15 10:22 AM.  Always use your most recent med list.               acetaminophen 325 MG tablet  Commonly known as:  TYLENOL  Take 650 mg by mouth 3 (three) times daily.     dextromethorphan 30 MG/5ML liquid  Commonly known as:  DELSYM  Take by mouth. 1 tsp every 12 hours as needed for cough     ferrous sulfate 325 (65 FE) MG tablet  Take 325 mg by mouth daily with breakfast.     fluocinonide cream 0.05 %  Commonly known as:  LIDEX  Apply 1 application topically 2 (two) times daily. Apply to hands BID as needed     insulin glargine 100 UNIT/ML injection  Commonly known as:  LANTUS  Inject 5 Units into the skin at bedtime.     latanoprost 0.005 % ophthalmic solution  Commonly known as:  XALATAN  Place 1 drop into the right eye at bedtime.     loratadine 10 MG tablet  Commonly known as:  CLARITIN  Take 10 mg by mouth daily as needed for allergies.     metoprolol succinate 50 MG 24 hr tablet  Commonly known as:  TOPROL-XL  Take 50 mg by mouth daily.     NAMENDA XR 28 MG Cp24 24 hr capsule  Generic drug:  memantine  Take by mouth.     omeprazole 20 MG capsule  Commonly known  as:  PRILOSEC  Take 20 mg by mouth 2 (two) times daily before a meal.     PARoxetine 20 MG tablet  Commonly known as:  PAXIL  Take 20 mg by mouth daily.     polyethylene glycol packet  Commonly  known as:  MIRALAX / GLYCOLAX  Take 17 g by mouth daily.     polyvinyl alcohol 1.4 % ophthalmic solution  Commonly known as:  LIQUIFILM TEARS  Place 1 drop into both eyes. One drop four times daily as needed     triamcinolone cream 0.1 %  Commonly known as:  KENALOG  Apply 1 application topically. Apply thin layer to arms, legs and back as needed     VITAMIN B 12 PO  Take 1,000 mcg by mouth daily.     Vitamin D 2000 units Caps  Take by mouth.         Review of Systems:  Review of Systems  Unable to perform ROS: Dementia    Physical Exam: Filed Vitals:   10/26/15 1019  BP: 138/73  Pulse: 79  Temp: 99.4 F (37.4 C)  Resp: 24  SpO2: 85%   There is no weight on file to calculate BMI. Physical Exam  Constitutional: No distress.  HENT:  Head: Normocephalic and atraumatic.  Right Ear: There is drainage, swelling and tenderness.  Left Ear: There is drainage, swelling and tenderness.  Nose: Mucosal edema and rhinorrhea present. Right sinus exhibits no maxillary sinus tenderness and no frontal sinus tenderness. Left sinus exhibits no maxillary sinus tenderness and no frontal sinus tenderness.  Mouth/Throat: Oropharynx is clear and moist and mucous membranes are normal. No oropharyngeal exudate.  Significant amt of purulent matter, attempted to remove to visualize TM but resident jerked away  Eyes: Pupils are equal, round, and reactive to light. Right eye exhibits discharge. Left eye exhibits discharge.  Neck: No JVD present.  Cardiovascular: Normal rate and regular rhythm.   No murmur heard. Pulmonary/Chest: Effort normal.  Rhonchi noted on the right  Abdominal: Soft. Bowel sounds are normal.  Lymphadenopathy:    He has cervical adenopathy.  Neurological: He is alert.   Pleasant, not able to follow commands, verbal but did not answer q's  Skin: Skin is warm and dry. He is not diaphoretic.  Psychiatric: He has a normal mood and affect.    Labs reviewed: Basic Metabolic Panel:  Recent Labs  01/25/15 02/09/15 08/31/15  NA 142 141 142  K 5.3 5.0 4.8  BUN 30* 30* 31*  CREATININE 1.6* 1.5* 1.7*   Liver Function Tests:  Recent Labs  02/09/15  AST 13*  ALT 12  ALKPHOS 65   No results for input(s): LIPASE, AMYLASE in the last 8760 hours. No results for input(s): AMMONIA in the last 8760 hours. CBC:  Recent Labs  01/25/15 02/09/15 08/31/15  WBC 6.8 8.7 7.1  HGB 10.5* 9.6* 10.3*  HCT 32* 29* 31*  PLT 242 225 208   Lipid Panel: No results for input(s): CHOL, HDL, LDLCALC, TRIG, CHOLHDL, LDLDIRECT in the last 8760 hours. Lab Results  Component Value Date   HGBA1C 7.5 08/31/2015   HGBA1C 7.5 08/31/2015    Procedures: No results found.  Assessment/Plan 1. Recurrent acute suppurative otitis media without spontaneous rupture of tympanic membrane of both sides -cipro otic 0.25 ml to both ears BID for 7 days -clear external canal of purulent matter -hx of membrane rupture so did not irrigate, could not fully visually TM due to purulent matter -would like to refer to ENT but given his immobility and combativeness will hold off for now  2. Acute bronchitis, unspecified organism -noted cough for 1 week with decreased 02 sats (85%) -purulent matter from eyes and ears as well -XRAY neg -Augmentin BID for 7 days  3. Uncontrolled type 2 diabetes mellitus with complication, with long-term current use of insulin (HCC) -check CBGs TID for 3 days, add Novolog 3 units with meals for CBGs >150   Cindi Carbon, [place] [phone]

## 2015-11-16 ENCOUNTER — Non-Acute Institutional Stay (SKILLED_NURSING_FACILITY): Payer: Medicare Other | Admitting: Adult Health

## 2015-11-16 ENCOUNTER — Encounter: Payer: Self-pay | Admitting: Adult Health

## 2015-11-16 DIAGNOSIS — R05 Cough: Secondary | ICD-10-CM | POA: Diagnosis not present

## 2015-11-16 DIAGNOSIS — R059 Cough, unspecified: Secondary | ICD-10-CM

## 2015-11-16 DIAGNOSIS — F0281 Dementia in other diseases classified elsewhere with behavioral disturbance: Secondary | ICD-10-CM

## 2015-11-16 DIAGNOSIS — G301 Alzheimer's disease with late onset: Secondary | ICD-10-CM

## 2015-11-16 DIAGNOSIS — F02818 Dementia in other diseases classified elsewhere, unspecified severity, with other behavioral disturbance: Secondary | ICD-10-CM

## 2015-11-16 NOTE — Progress Notes (Signed)
Patient ID: [name], male   DOB: 23-Sep-1921, 82 y.o.   MRN: [id number]  Location:  [place]:  SNF (31) Provider:   Cindi Carbon, ANP [place] [phone]   Byrd, Dale Sidle, DO  Patient Care Team: [name], DO as PCP - General (Geriatric Medicine) Raynelle Bring, MD as Consulting Physician (Urology) Jarome Matin, MD as Consulting Physician (Dermatology)  Extended Emergency Contact Information Primary Emergency Contact: [name],[name] Address: [address]          [place], [place] [address] [name] of [place] Phone: [phone] Mobile Phone: [phone] Relation: Son  Code Status:  DNR Goals of care: Advanced Directive information Advanced Directives 10/24/2015  Does patient have an advance directive? Yes  Type of Paramedic of [place];Living will;Out of facility DNR (pink MOST or yellow form)  Copy of advanced directive(s) in chart? Yes  Pre-existing out of facility DNR order (yellow form or pink MOST form) Yellow form placed in chart (order not valid for inpatient use);Pink MOST form placed in chart (order not valid for inpatient use)     Chief Complaint  Patient presents with  . Acute Visit    increased behaviors, cough    HPI:  Pt is a 82 y.o. male seen today for an acute visit for cough with nasal drainage. First noted at the beginning of April, CXR on 4/4 was neg. Placed on Augmentin and cipro otic gtts for URI and otitis externa on 10/26/15.  Staff reports congested cough today.  No fever or sob.  He has also had increased behaviors for the past few months that have been escalating over time. He makes sexually inappropriate comments, hits, kicks, and spits at the staff. It takes 3 people at times to perform his bath.  He has advanced dementia and is currently on namenda.    Past Medical History  Diagnosis Date  . Anemia 2012  . Arthritis   . Diabetes mellitus 1997   . Hyperlipidemia   . Hypertension 2008  . Actinic keratosis   . Macular degeneration (senile) of retina, unspecified   . Epilepsy ([place]) 1943  . Unspecified venous (peripheral) insufficiency   . Glaucoma   . H/O prostate cancer 2011    treated with radiation  . Major depressive disorder, single episode, unspecified ([place])   . Other B-complex deficiencies 2012  . Unspecified vitamin D deficiency 2010  . Reflux esophagitis 2002  . Generalized osteoarthrosis, unspecified site   . Personal history of colonic polyps 2012    adenomatous  . Coronary atherosclerosis of native coronary artery 2002    60% stenois of the 1st diagonal of the LAD  . Type II or unspecified type diabetes mellitus with peripheral circulatory disorders, not stated as uncontrolled(250.70) 05/24/2009    Qualifier: Diagnosis of  By: Caryl Comes, MD, Remus Blake   . Alzheimer disease 09/13/2008    Qualifier: Diagnosis of  By: Nils Pyle CMA ([place]), Mearl Latin    . History of fall 2013  . Deaf   . 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 Gastric ulcer 2012  . Osteoarthritis of knee 10/07/2013    Qualifier: Diagnosis of  By: Nils Pyle CMA Deborra Medina), Mearl Latin     Past Surgical History  Procedure Laterality Date  . Appendectomy  1947  . Colonoscopy  10/22/10    multiple adenomatous polyps  . Tonsillectomy  1930  . Carpal  tunnel release Bilateral 2005  . Myringotomy Right     Allergies  Allergen Reactions  . Aspirin   . Nsaids       Medication List       This list is accurate as of: 11/16/15 11:32 AM.  Always use your most recent med list.               acetaminophen 325 MG tablet  Commonly known as:  TYLENOL  Take 650 mg by mouth 3 (three) times daily.     dextromethorphan 30 MG/5ML liquid  Commonly known as:  DELSYM  Take by mouth. 1 tsp every 12 hours as needed for cough     ferrous sulfate 325 (65 FE) MG tablet  Take 325 mg by mouth daily with breakfast.      fluocinonide cream 0.05 %  Commonly known as:  LIDEX  Apply 1 application topically 2 (two) times daily. Apply to hands BID as needed     insulin glargine 100 UNIT/ML injection  Commonly known as:  LANTUS  Inject 5 Units into the skin at bedtime.     latanoprost 0.005 % ophthalmic solution  Commonly known as:  XALATAN  Place 1 drop into the right eye at bedtime.     loratadine 10 MG tablet  Commonly known as:  CLARITIN  Take 10 mg by mouth daily as needed for allergies.     metoprolol succinate 50 MG 24 hr tablet  Commonly known as:  TOPROL-XL  Take 50 mg by mouth daily.     NAMENDA XR 28 MG Cp24 24 hr capsule  Generic drug:  memantine  Take by mouth.     omeprazole 20 MG capsule  Commonly known as:  PRILOSEC  Take 20 mg by mouth 2 (two) times daily before a meal.     PARoxetine 20 MG tablet  Commonly known as:  PAXIL  Take 20 mg by mouth daily.     polyethylene glycol packet  Commonly known as:  MIRALAX / GLYCOLAX  Take 17 g by mouth daily.     polyvinyl alcohol 1.4 % ophthalmic solution  Commonly known as:  LIQUIFILM TEARS  Place 1 drop into both eyes. One drop four times daily as needed     triamcinolone cream 0.1 %  Commonly known as:  KENALOG  Apply 1 application topically. Apply thin layer to arms, legs and back as needed     VITAMIN B 12 PO  Take 1,000 mcg by mouth daily.     Vitamin D 2000 units Caps  Take by mouth.        Review of Systems  Unable to perform ROS: Dementia    Immunization History  Administered Date(s) Administered  . Influenza-Unspecified 05/21/2013, 04/26/2014, 05/04/2015  . Pneumococcal Polysaccharide-23 10/11/2009  . Td 10/11/2011  . Zoster 10/17/2010   Pertinent  Health Maintenance Due  Topic Date Due  . FOOT EXAM  08/05/1933  . OPHTHALMOLOGY EXAM  08/05/1933  . URINE MICROALBUMIN  08/05/1933  . PNA vac Low Risk Adult (2 of 2 - PCV13) 10/12/2010  . COLONOSCOPY  10/21/2013  . INFLUENZA VACCINE  02/20/2016  .  HEMOGLOBIN A1C  02/28/2016   Fall Risk  03/16/2015 11/07/2014  Falls in the past year? No No  Risk for fall due to : History of fall(s) Impaired balance/gait;Impaired mobility;Medication side effect;History of fall(s)   97.2 58 19 90% 125/78  Physical Exam  Constitutional: No distress.  HENT:  Head: Normocephalic and atraumatic.  Left Ear: External ear normal.  Right TM visualized and WNL, cerumen noted with small amt of purulent matter. No erythema on the Right. Left TM normal  Eyes: Conjunctivae are normal. Pupils are equal, round, and reactive to light.  Mild yellow drainage to both eyes  Neck: No JVD present.  Cardiovascular: Normal rate and regular rhythm.   No murmur heard. Pulmonary/Chest: Effort normal. No respiratory distress.  Bilateral rhonchi throughout  Abdominal: Soft. Bowel sounds are normal.  Lymphadenopathy:    He has no cervical adenopathy.  Neurological: He is alert.  Oriented to self only  Skin: Skin is warm and dry. He is not diaphoretic.  Psychiatric:  Mild irritation during my visit but no aggressive behavior    Labs reviewed:  Recent Labs  01/25/15 02/09/15 08/31/15  NA 142 141 142  K 5.3 5.0 4.8  BUN 30* 30* 31*  CREATININE 1.6* 1.5* 1.7*    Recent Labs  02/09/15  AST 13*  ALT 12  ALKPHOS 65    Recent Labs  01/25/15 02/09/15 08/31/15  WBC 6.8 8.7 7.1  HGB 10.5* 9.6* 10.3*  HCT 32* 29* 31*  PLT 242 225 208   Lab Results  Component Value Date   TSH 2.41 07/27/2014   Lab Results  Component Value Date   HGBA1C 7.5 08/31/2015   HGBA1C 7.5 08/31/2015   Lab Results  Component Value Date   CHOL 150 09/17/2013   HDL 57 09/17/2013   LDLCALC 72 09/17/2013   TRIG 107 09/17/2013   Lab Results  Component Value Date   ALT 12 02/09/2015   AST 13* 02/09/2015   ALKPHOS 65 02/09/2015   BILITOT 0.40 01/13/2013     Significant Diagnostic Results in last 30 days:  No results found.  Assessment/Plan  1. Cough -change claritin to 10  mg qd scheduled -check CXR 2 view of possible to rule out pna -if symptoms persist may treat for sinusitis  2. Alzheimer disease with behavioral disturbance -long term issue -start depakote 125 mg BID  -monitor CBC, LFT periodically -monitor for excess sedation    Family/ staff Communication: discussed with staff  Labs/tests ordered:  CXR

## 2015-11-27 ENCOUNTER — Non-Acute Institutional Stay (SKILLED_NURSING_FACILITY): Payer: Medicare Other | Admitting: Adult Health

## 2015-11-27 ENCOUNTER — Encounter: Payer: Self-pay | Admitting: Adult Health

## 2015-11-27 DIAGNOSIS — D509 Iron deficiency anemia, unspecified: Secondary | ICD-10-CM | POA: Diagnosis not present

## 2015-11-27 DIAGNOSIS — G301 Alzheimer's disease with late onset: Secondary | ICD-10-CM | POA: Diagnosis not present

## 2015-11-27 DIAGNOSIS — F0281 Dementia in other diseases classified elsewhere with behavioral disturbance: Secondary | ICD-10-CM | POA: Diagnosis not present

## 2015-11-27 DIAGNOSIS — F02818 Dementia in other diseases classified elsewhere, unspecified severity, with other behavioral disturbance: Secondary | ICD-10-CM

## 2015-11-27 DIAGNOSIS — Z794 Long term (current) use of insulin: Secondary | ICD-10-CM | POA: Diagnosis not present

## 2015-11-27 DIAGNOSIS — I10 Essential (primary) hypertension: Secondary | ICD-10-CM

## 2015-11-27 DIAGNOSIS — E1122 Type 2 diabetes mellitus with diabetic chronic kidney disease: Secondary | ICD-10-CM | POA: Diagnosis not present

## 2015-11-27 DIAGNOSIS — R569 Unspecified convulsions: Secondary | ICD-10-CM | POA: Diagnosis not present

## 2015-11-27 DIAGNOSIS — J209 Acute bronchitis, unspecified: Secondary | ICD-10-CM | POA: Diagnosis not present

## 2015-11-27 DIAGNOSIS — N183 Chronic kidney disease, stage 3 unspecified: Secondary | ICD-10-CM

## 2015-11-27 NOTE — Progress Notes (Signed)
Patient ID: [name], male   DOB: Jul 01, 1922, 82 y.o.   MRN: [id number]  Location:  [place]:  SNF (31) Provider:   Cindi Carbon, ANP [place] [phone]   REED, Jonelle Sidle, DO  Patient Care Team: [name], DO as PCP - General (Geriatric Medicine) Raynelle Bring, MD as Consulting Physician (Urology) Jarome Matin, MD as Consulting Physician (Dermatology)  Extended Emergency Contact Information Primary Emergency Contact: Streets,[name] Address: [address]          [place], [place] [address] [name] of [place] Phone: [phone] Mobile Phone: [phone] Relation: Son  Code Status:  DNR Goals of care: Advanced Directive information Advanced Directives 10/24/2015  Does patient have an advance directive? Yes  Type of Paramedic of [place];Living will;Out of facility DNR (pink MOST or yellow form)  Copy of advanced directive(s) in chart? Yes  Pre-existing out of facility DNR order (yellow form or pink MOST form) Yellow form placed in chart (order not valid for inpatient use);Pink MOST form placed in chart (order not valid for inpatient use)     Chief Complaint  Patient presents with  . Medical Management of Chronic Issues    HPI:  Pt is a 82 y.o. male seen today for medical management of chronic diseases. He has a hx of dementia with behavioral disturbance, necessitating skilled care. His weight is stable at 198 lbs. His BP has ranged 147-173/81-88.  Blood sugars range 153-199.  He was started on depakote for agitation and there is only one note in the computer indicating aggressive behavior. He has had a congested cough and wheezing for 1 month.  CXR on 4/28 was WNL. He was treated with Augmentin for otitis media and cough on 4/6 with no further ear pain, fever or drainage, but he continues with a cough.    Past Medical History  Diagnosis Date  . Anemia 2012  . Arthritis     . Diabetes mellitus 1997  . Hyperlipidemia   . Hypertension 2008  . Actinic keratosis   . Macular degeneration (senile) of retina, unspecified   . Epilepsy ([place]) 1943  . Unspecified venous (peripheral) insufficiency   . Glaucoma   . H/O prostate cancer 2011    treated with radiation  . Major depressive disorder, single episode, unspecified ([place])   . Other B-complex deficiencies 2012  . Unspecified vitamin D deficiency 2010  . Reflux esophagitis 2002  . Generalized osteoarthrosis, unspecified site   . Personal history of colonic polyps 2012    adenomatous  . Coronary atherosclerosis of native coronary artery 2002    60% stenois of the 1st diagonal of the LAD  . Type II or unspecified type diabetes mellitus with peripheral circulatory disorders, not stated as uncontrolled(250.70) 05/24/2009    Qualifier: Diagnosis of  By: Caryl Comes, MD, Remus Blake   . Alzheimer disease 09/13/2008    Qualifier: Diagnosis of  By: Nils Pyle CMA ([place]), Mearl Latin    . History of fall 2013  . Deaf   . 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 Gastric ulcer 2012  . Osteoarthritis of knee 10/07/2013    Qualifier: Diagnosis of  By: Nils Pyle CMA Deborra Medina), Mearl Latin     Past Surgical History  Procedure Laterality Date  . Appendectomy  1947  . Colonoscopy  10/22/10    multiple adenomatous polyps  . Tonsillectomy  1930  . Carpal  tunnel release Bilateral 2005  . Myringotomy Right     Allergies  Allergen Reactions  . Aspirin   . Nsaids       Medication List       This list is accurate as of: 11/27/15  3:07 PM.  Always use your most recent med list.               acetaminophen 325 MG tablet  Commonly known as:  TYLENOL  Take 650 mg by mouth 3 (three) times daily.     dextromethorphan 30 MG/5ML liquid  Commonly known as:  DELSYM  Take by mouth. 1 tsp every 12 hours as needed for cough     ferrous sulfate 325 (65 FE) MG tablet  Take 325 mg by mouth daily  with breakfast.     fluocinonide cream 0.05 %  Commonly known as:  LIDEX  Apply 1 application topically 2 (two) times daily. Apply to hands BID as needed     insulin glargine 100 UNIT/ML injection  Commonly known as:  LANTUS  Inject 5 Units into the skin at bedtime.     latanoprost 0.005 % ophthalmic solution  Commonly known as:  XALATAN  Place 1 drop into the right eye at bedtime.     loratadine 10 MG tablet  Commonly known as:  CLARITIN  Take 10 mg by mouth daily as needed for allergies.     metoprolol succinate 50 MG 24 hr tablet  Commonly known as:  TOPROL-XL  Take 50 mg by mouth daily.     NAMENDA XR 28 MG Cp24 24 hr capsule  Generic drug:  memantine  Take by mouth.     omeprazole 20 MG capsule  Commonly known as:  PRILOSEC  Take 20 mg by mouth 2 (two) times daily before a meal.     PARoxetine 20 MG tablet  Commonly known as:  PAXIL  Take 20 mg by mouth daily.     polyethylene glycol packet  Commonly known as:  MIRALAX / GLYCOLAX  Take 17 g by mouth daily.     polyvinyl alcohol 1.4 % ophthalmic solution  Commonly known as:  LIQUIFILM TEARS  Place 1 drop into both eyes. One drop four times daily as needed     triamcinolone cream 0.1 %  Commonly known as:  KENALOG  Apply 1 application topically. Apply thin layer to arms, legs and back as needed     VITAMIN B 12 PO  Take 1,000 mcg by mouth daily.     Vitamin D 2000 units Caps  Take by mouth.        Review of Systems  Unable to perform ROS: Dementia    Immunization History  Administered Date(s) Administered  . Influenza-Unspecified 05/21/2013, 04/26/2014, 05/04/2015  . Pneumococcal Polysaccharide-23 10/11/2009  . Td 10/11/2011  . Zoster 10/17/2010   Pertinent  Health Maintenance Due  Topic Date Due  . FOOT EXAM  08/05/1933  . OPHTHALMOLOGY EXAM  08/05/1933  . URINE MICROALBUMIN  08/05/1933  . PNA vac Low Risk Adult (2 of 2 - PCV13) 10/12/2010  . COLONOSCOPY  10/21/2013  . INFLUENZA VACCINE   02/20/2016  . HEMOGLOBIN A1C  02/28/2016   Fall Risk  03/16/2015 11/07/2014  Falls in the past year? No No  Risk for fall due to : History of fall(s) Impaired balance/gait;Impaired mobility;Medication side effect;History of fall(s)   Wt Readings from Last 3 Encounters:  11/27/15 198 lb (89.812 kg)  10/24/15 201 lb (91.173 kg)  10/02/15 198 lb 12.8 oz (90.175 kg)    Physical Exam  Constitutional: No distress.  HENT:  Head: Normocephalic and atraumatic.  Right Ear: No drainage, swelling or tenderness. Decreased hearing is noted.  Left Ear: Tympanic membrane, external ear and ear canal normal. Decreased hearing is noted.  Nose: Rhinorrhea present. Right sinus exhibits no maxillary sinus tenderness and no frontal sinus tenderness. Left sinus exhibits no maxillary sinus tenderness and no frontal sinus tenderness.  Right TM not visualized due to wax.   Eyes: Conjunctivae are normal. Pupils are equal, round, and reactive to light.  Neck: No JVD present.  Cardiovascular: Normal rate and regular rhythm.   No murmur heard. Pulmonary/Chest: Effort normal. No respiratory distress.  Scattered wheezing and rhonchi  Abdominal: Soft. Bowel sounds are normal.  Lymphadenopathy:    He has no cervical adenopathy.  Neurological: He is alert.  Oriented to self only  Skin: Skin is warm and dry. He is not diaphoretic.  Psychiatric:  Mild irritation during my visit but no aggressive behavior    Labs reviewed:  Recent Labs  01/25/15 02/09/15 08/31/15  NA 142 141 142  K 5.3 5.0 4.8  BUN 30* 30* 31*  CREATININE 1.6* 1.5* 1.7*    Recent Labs  02/09/15  AST 13*  ALT 12  ALKPHOS 65    Recent Labs  01/25/15 02/09/15 08/31/15  WBC 6.8 8.7 7.1  HGB 10.5* 9.6* 10.3*  HCT 32* 29* 31*  PLT 242 225 208   Lab Results  Component Value Date   TSH 2.41 07/27/2014   Lab Results  Component Value Date   HGBA1C 7.5 08/31/2015   HGBA1C 7.5 08/31/2015   Lab Results  Component Value Date   CHOL  150 09/17/2013   HDL 57 09/17/2013   LDLCALC 72 09/17/2013   TRIG 107 09/17/2013   Lab Results  Component Value Date   ALT 12 02/09/2015   AST 13* 02/09/2015   ALKPHOS 65 02/09/2015   BILITOT 0.40 01/13/2013     Significant Diagnostic Results in last 30 days:  No results found.  Assessment/Plan  1. Acute bronchitis, unspecified organism -cough x 1 month with wheezing -Doxycycline 100 mg bid for 7 days -Duoneb BID for 5 days  2. Type 2 diabetes mellitus with stage 3 chronic kidney disease, with long-term current use of insulin (HCC) -Stable, goal A1C <8% -continue lantus at 5 units qhs  3. CKD (chronic kidney disease) stage 3, GFR 30-59 ml/min -stable -continue to monitor  4. Iron deficiency anemia -stable -continue iron and periodic CBC montioring  5. Convulsions, unspecified convulsion type ([place]) -remote with no new episodes  6. Essential hypertension -elevated at times  -continue to monitor, if consistently elevated could add hydralazine or increase metoprolol  7. Late onset Alzheimer's disease with behavioral disturbance -improved behaviors -continue depakote and namenda -check depakote level  Cindi Carbon, ANP [facility] [phone]

## 2015-11-27 NOTE — Assessment & Plan Note (Signed)
Continue lantus 5 units qhs, Goal A1C <8%

## 2015-11-30 DIAGNOSIS — R569 Unspecified convulsions: Secondary | ICD-10-CM | POA: Diagnosis not present

## 2015-12-19 DIAGNOSIS — E119 Type 2 diabetes mellitus without complications: Secondary | ICD-10-CM | POA: Diagnosis not present

## 2015-12-19 DIAGNOSIS — B351 Tinea unguium: Secondary | ICD-10-CM | POA: Diagnosis not present

## 2015-12-28 ENCOUNTER — Non-Acute Institutional Stay (SKILLED_NURSING_FACILITY): Payer: Medicare Other | Admitting: Adult Health

## 2015-12-28 DIAGNOSIS — N183 Chronic kidney disease, stage 3 unspecified: Secondary | ICD-10-CM

## 2015-12-28 DIAGNOSIS — Z794 Long term (current) use of insulin: Secondary | ICD-10-CM

## 2015-12-28 DIAGNOSIS — E1122 Type 2 diabetes mellitus with diabetic chronic kidney disease: Secondary | ICD-10-CM | POA: Diagnosis not present

## 2015-12-28 DIAGNOSIS — F0281 Dementia in other diseases classified elsewhere with behavioral disturbance: Secondary | ICD-10-CM | POA: Diagnosis not present

## 2015-12-28 DIAGNOSIS — G301 Alzheimer's disease with late onset: Secondary | ICD-10-CM | POA: Diagnosis not present

## 2015-12-28 DIAGNOSIS — D509 Iron deficiency anemia, unspecified: Secondary | ICD-10-CM

## 2015-12-28 DIAGNOSIS — F02818 Dementia in other diseases classified elsewhere, unspecified severity, with other behavioral disturbance: Secondary | ICD-10-CM

## 2016-01-10 ENCOUNTER — Encounter: Payer: Self-pay | Admitting: Adult Health

## 2016-01-10 NOTE — Progress Notes (Signed)
Patient ID: [name], male   DOB: 15-Sep-1921, 82 y.o.   MRN: [id number]  Location:   [facility]   Place of Service:  SNF (31) Provider:   Cindi Carbon, ANP [facility] [phone]   REED, Jonelle Sidle, DO  Patient Care Team: [name], DO as PCP - General (Geriatric Medicine) Raynelle Bring, MD as Consulting Physician (Urology) Jarome Matin, MD as Consulting Physician (Dermatology)  Extended Emergency Contact Information Primary Emergency Contact: Claxton,[name] Address: [address]          [place], [place] [address] [name] of [place] Phone: [phone] Mobile Phone: [phone] Relation: Son  Code Status:  DNR Goals of care: Advanced Directive information Advanced Directives 10/24/2015  Does patient have an advance directive? Yes  Type of Paramedic of [place];Living will;Out of facility DNR (pink MOST or yellow form)  Copy of advanced directive(s) in chart? Yes  Pre-existing out of facility DNR order (yellow form or pink MOST form) Yellow form placed in chart (order not valid for inpatient use);Pink MOST form placed in chart (order not valid for inpatient use)     Chief Complaint  Patient presents with  . Medical Management of Chronic Issues    HPI:  Pt is a 82 y.o. male seen today for medical management of chronic diseases. He has a hx of dementia with behavioral disturbance, necessitating skilled care. His weight is stable at 202 lbs.  1. Type 2 diabetes mellitus with stage 3 chronic kidney disease, with long-term current use of insulin (HCC) CBGS range 150-165 On lantus A1C 7.5 on 08/31/15  2. CKD (chronic kidney disease) stage 3, GFR 30-59 ml/min Lab Results  Component Value Date   BUN 31* 08/31/2015   Lab Results  Component Value Date   CREATININE 1.7* 08/31/2015  stable   3. Late onset Alzheimer's disease with behavioral disturbance Has had periods of agitation and aggression towards staff, also  makes sexually inappropriate comments at times. Currently on Namenda 28 mg and Depakote 125 mg BID.  Staff reports his behaviors have improved significantly and is more agreeable during personal care. Remains on paxil for sexual behaviors as well.  4. Iron deficiency anemia Last Hgb 10.3 on 2/9 which is stable Has a hx of heme pos stool and IDA with no workup due to age and debility Responded well to discontinuation of aspirin and starting iron No further bleeding   Functional status: Harrel Lemon lift, incontinent   Past Medical History  Diagnosis Date  . Anemia 2012  . Arthritis   . Diabetes mellitus 1997  . Hyperlipidemia   . Hypertension 2008  . Actinic keratosis   . Macular degeneration (senile) of retina, unspecified   . Epilepsy ([place]) 1943  . Unspecified venous (peripheral) insufficiency   . Glaucoma   . H/O prostate cancer 2011    treated with radiation  . Major depressive disorder, single episode, unspecified ([place])   . Other B-complex deficiencies 2012  . Unspecified vitamin D deficiency 2010  . Reflux esophagitis 2002  . Generalized osteoarthrosis, unspecified site   . Personal history of colonic polyps 2012    adenomatous  . Coronary atherosclerosis of native coronary artery 2002    60% stenois of the 1st diagonal of the LAD  . Type II or unspecified type diabetes mellitus with peripheral circulatory disorders, not stated as uncontrolled(250.70) 05/24/2009    Qualifier: Diagnosis of  By: Caryl Comes, MD, Remus Blake   . Alzheimer disease 09/13/2008  Qualifier: Diagnosis of  By: Nils Pyle CMA ([place]), Mearl Latin    . History of fall 2013  . Deaf   . 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 Gastric ulcer 2012  . Osteoarthritis of knee 10/07/2013    Qualifier: Diagnosis of  By: Nils Pyle CMA Deborra Medina), Mearl Latin     Past Surgical History  Procedure Laterality Date  . Appendectomy  1947  . Colonoscopy  10/22/10    multiple adenomatous polyps   . Tonsillectomy  1930  . Carpal tunnel release Bilateral 2005  . Myringotomy Right     Allergies  Allergen Reactions  . Aspirin   . Nsaids       Medication List       This list is accurate as of: 12/28/15 11:59 PM.  Always use your most recent med list.               acetaminophen 325 MG tablet  Commonly known as:  TYLENOL  Take 650 mg by mouth 3 (three) times daily.     dextromethorphan 30 MG/5ML liquid  Commonly known as:  DELSYM  Take by mouth. 1 tsp every 12 hours as needed for cough     divalproex 125 MG capsule  Commonly known as:  DEPAKOTE SPRINKLE  Take 125 mg by mouth 2 (two) times daily.     ferrous sulfate 325 (65 FE) MG tablet  Take 325 mg by mouth daily with breakfast.     fluocinonide cream 0.05 %  Commonly known as:  LIDEX  Apply 1 application topically 2 (two) times daily. Apply to hands BID as needed     insulin glargine 100 UNIT/ML injection  Commonly known as:  LANTUS  Inject 5 Units into the skin at bedtime.     latanoprost 0.005 % ophthalmic solution  Commonly known as:  XALATAN  Place 1 drop into the right eye at bedtime.     loratadine 10 MG tablet  Commonly known as:  CLARITIN  Take 10 mg by mouth daily.     metoprolol succinate 50 MG 24 hr tablet  Commonly known as:  TOPROL-XL  Take 50 mg by mouth daily.     NAMENDA XR 28 MG Cp24 24 hr capsule  Generic drug:  memantine  Take by mouth.     omeprazole 20 MG capsule  Commonly known as:  PRILOSEC  Take 20 mg by mouth 2 (two) times daily before a meal.     PARoxetine 20 MG tablet  Commonly known as:  PAXIL  Take 20 mg by mouth daily.     polyethylene glycol packet  Commonly known as:  MIRALAX / GLYCOLAX  Take 17 g by mouth daily.     polyvinyl alcohol 1.4 % ophthalmic solution  Commonly known as:  LIQUIFILM TEARS  Place 1 drop into both eyes. One drop four times daily as needed     triamcinolone cream 0.1 %  Commonly known as:  KENALOG  Apply 1 application topically.  Apply thin layer to arms, legs and back as needed     VITAMIN B 12 PO  Take 1,000 mcg by mouth daily.     Vitamin D 2000 units Caps  Take by mouth.        Review of Systems  Unable to perform ROS: Dementia    Immunization History  Administered Date(s) Administered  . Influenza-Unspecified 05/21/2013, 04/26/2014, 05/04/2015  . Pneumococcal Polysaccharide-23 10/11/2009  . Td 10/11/2011  .  Zoster 10/17/2010   Pertinent  Health Maintenance Due  Topic Date Due  . FOOT EXAM  08[birth year]05/1931  . OPHTHALMOLOGY EXAM  08/05/1931  . URINE MICROALBUMIN  08/05/1933  . PNA vac Low Risk Adult (2 of 2 - PCV13) 10/12/2010  . COLONOSCOPY  10/21/2013  . INFLUENZA VACCINE  02/20/2016  . HEMOGLOBIN A1C  02/28/2016   Fall Risk  03/16/2015 11/07/2014  Falls in the past year? No No  Risk for fall due to : History of fall(s) Impaired balance/gait;Impaired mobility;Medication side effect;History of fall(s)   Wt Readings from Last 3 Encounters:  01/10/16 202 lb (91.627 kg)  11/27/15 198 lb (89.812 kg)  10/24/15 201 lb (91.173 kg)    Physical Exam  Constitutional: No distress.  HENT:  Head: Normocephalic and atraumatic.  Right Ear: No drainage, swelling or tenderness. Decreased hearing is noted.  Left Ear: Tympanic membrane and ear canal normal. Decreased hearing is noted.  Nose: Nose normal. Right sinus exhibits no maxillary sinus tenderness and no frontal sinus tenderness. Left sinus exhibits no maxillary sinus tenderness and no frontal sinus tenderness.  Eyes: Conjunctivae are normal. Pupils are equal, round, and reactive to light.  Neck: No JVD present.  Cardiovascular: Normal rate and regular rhythm.   No murmur heard. Pulmonary/Chest: Effort normal and breath sounds normal. No respiratory distress.  Abdominal: Soft. Bowel sounds are normal.  Lymphadenopathy:    He has no cervical adenopathy.  Neurological: He is alert.  Oriented to self only  Skin: Skin is warm and dry. He is not  diaphoretic.  Psychiatric: He has a normal mood and affect.    Labs reviewed:  Recent Labs  01/25/15 02/09/15 08/31/15  NA 142 141 142  K 5.3 5.0 4.8  BUN 30* 30* 31*  CREATININE 1.6* 1.5* 1.7*    Recent Labs  02/09/15  AST 13*  ALT 12  ALKPHOS 65    Recent Labs  01/25/15 02/09/15 08/31/15  WBC 6.8 8.7 7.1  HGB 10.5* 9.6* 10.3*  HCT 32* 29* 31*  PLT 242 225 208   Lab Results  Component Value Date   TSH 2.41 07/27/2014   Lab Results  Component Value Date   HGBA1C 7.5 08/31/2015   HGBA1C 7.5 08/31/2015   Lab Results  Component Value Date   CHOL 150 09/17/2013   HDL 57 09/17/2013   LDLCALC 72 09/17/2013   TRIG 107 09/17/2013   Lab Results  Component Value Date   ALT 12 02/09/2015   AST 13* 02/09/2015   ALKPHOS 65 02/09/2015   BILITOT 0.40 01/13/2013     Significant Diagnostic Results in last 30 days:  No results found.  Assessment/Plan  1. Type 2 diabetes mellitus with stage 3 chronic kidney disease, with long-term current use of insulin (HCC) Controlled Continue lantus 5 units qhs Monitor A1C q 6 mo, goal <8%  2. CKD (chronic kidney disease) stage 3, GFR 30-59 ml/min Stable Continue to monitor Avoid nephrotoxic agents  3. Late onset Alzheimer's disease with behavioral disturbance Improved agitation with depakote Continue current dose of depakote, paxil, and namenda  4. Iron deficiency anemia Stable with no overt bleeding Conservative management Continue ferrous sulfate 325 qd, monitor for constipation   Cindi Carbon, ANP [facility] [phone]

## 2016-01-22 ENCOUNTER — Non-Acute Institutional Stay (SKILLED_NURSING_FACILITY): Payer: Medicare Other | Admitting: Adult Health

## 2016-01-22 DIAGNOSIS — H109 Unspecified conjunctivitis: Secondary | ICD-10-CM | POA: Diagnosis not present

## 2016-01-22 DIAGNOSIS — Z794 Long term (current) use of insulin: Secondary | ICD-10-CM | POA: Diagnosis not present

## 2016-01-22 DIAGNOSIS — R569 Unspecified convulsions: Secondary | ICD-10-CM | POA: Diagnosis not present

## 2016-01-22 DIAGNOSIS — E1122 Type 2 diabetes mellitus with diabetic chronic kidney disease: Secondary | ICD-10-CM | POA: Diagnosis not present

## 2016-01-22 DIAGNOSIS — D649 Anemia, unspecified: Secondary | ICD-10-CM | POA: Diagnosis not present

## 2016-01-22 DIAGNOSIS — N183 Chronic kidney disease, stage 3 unspecified: Secondary | ICD-10-CM

## 2016-01-22 DIAGNOSIS — H9193 Unspecified hearing loss, bilateral: Secondary | ICD-10-CM

## 2016-01-22 DIAGNOSIS — G301 Alzheimer's disease with late onset: Secondary | ICD-10-CM

## 2016-01-22 DIAGNOSIS — F0281 Dementia in other diseases classified elsewhere with behavioral disturbance: Secondary | ICD-10-CM

## 2016-01-22 NOTE — Progress Notes (Signed)
Patient ID: [name], male   DOB: 1922/01/30, 82 y.o.   MRN: [id number]  Location:   [facility]   Place of Service:  SNF (31) Provider:   Cindi Carbon, ANP [facility] [phone]   REED, Jonelle Sidle, DO  Patient Care Team: [name], DO as PCP - General (Geriatric Medicine) Raynelle Bring, MD as Consulting Physician (Urology) Jarome Matin, MD as Consulting Physician (Dermatology)  Extended Emergency Contact Information Primary Emergency Contact: Csaszar,[name] Address: [address]          [place], [place] [address] [name] of [place] Phone: [phone] Mobile Phone: [phone] Relation: Son  Code Status:  DNR Goals of care: Advanced Directive information Advanced Directives 10/24/2015  Does patient have an advance directive? Yes  Type of Paramedic of [place];Living will;Out of facility DNR (pink MOST or yellow form)  Copy of advanced directive(s) in chart? Yes  Pre-existing out of facility DNR order (yellow form or pink MOST form) Yellow form placed in chart (order not valid for inpatient use);Pink MOST form placed in chart (order not valid for inpatient use)     Chief Complaint  Patient presents with  . Acute Visit    left eye drainage  . Medical Management of Chronic Issues    HPI:  82 y.o.  residing at [facility]. I am here to review his chronic medical issues as acutely for left eye drainage.  VS have been stable over the past month. Weight has trended down by 4 lbs to 198 lbs. He has advanced dementia with behaviors and is no longer ambulatory, now residing in skilled care. The drainage was first noted today and happens frequently as he is diabetic and touches his eyes frequently. He is HOH and wears hearing aids, hx of myringotomy. DMII controlled, A1C 7.5 in Feb, on lantus 5 units qhs.  CBGs range 150-180.  Has periods of agitation but overall improvement with depakote. 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   Hx of heme pos stool with anemia, on ferrous sulfate and prilosec. Not worked up due to age/debility. H/H stable.   Functional status: Hoyer lift, incontinent   Past Medical History  Diagnosis Date  . Anemia 2012  . Arthritis   . Diabetes mellitus 1997  . Hyperlipidemia   . Hypertension 2008  . Actinic keratosis   . Macular degeneration (senile) of retina, unspecified   . Epilepsy ([place]) 1943  . Unspecified venous (peripheral) insufficiency   . Glaucoma   . H/O prostate cancer 2011    treated with radiation  . Major depressive disorder, single episode, unspecified ([place])   . Other B-complex deficiencies 2012  . Unspecified vitamin D deficiency 2010  . Reflux esophagitis 2002  . Generalized osteoarthrosis, unspecified site   . Personal history of colonic polyps 2012    adenomatous  . Coronary atherosclerosis of native coronary artery 2002    60% stenois of the 1st diagonal of the LAD  . Type II or unspecified type diabetes mellitus with peripheral circulatory disorders, not stated as uncontrolled(250.70) 05/24/2009    Qualifier: Diagnosis of  By: Caryl Comes, MD, Remus Blake   . Alzheimer disease 09/13/2008    Qualifier: Diagnosis of  By: Nils Pyle CMA ([place]), Mearl Latin    . History of fall 2013  . Deaf   . 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 Gastric ulcer 2012  . Osteoarthritis of knee  10/07/2013    Qualifier: Diagnosis of  By: Nils Pyle CMA Deborra Medina), Mearl Latin     Past Surgical History  Procedure Laterality Date  . Appendectomy  1947  . Colonoscopy  10/22/10    multiple adenomatous polyps  . Tonsillectomy  1930  . Carpal tunnel release Bilateral 2005  . Myringotomy Right     Allergies  Allergen Reactions  . Aspirin   . Nsaids       Medication List       This list is accurate as of: 01/22/16  3:31 PM.  Always use your most recent med list.               acetaminophen 325 MG tablet  Commonly known as:   TYLENOL  Take 650 mg by mouth 3 (three) times daily.     dextromethorphan 30 MG/5ML liquid  Commonly known as:  DELSYM  Take by mouth. 1 tsp every 12 hours as needed for cough     divalproex 125 MG capsule  Commonly known as:  DEPAKOTE SPRINKLE  Take 125 mg by mouth 2 (two) times daily.     ferrous sulfate 325 (65 FE) MG tablet  Take 325 mg by mouth daily with breakfast.     fluocinonide cream 0.05 %  Commonly known as:  LIDEX  Apply 1 application topically 2 (two) times daily. Apply to hands BID as needed     insulin glargine 100 UNIT/ML injection  Commonly known as:  LANTUS  Inject 5 Units into the skin at bedtime.     latanoprost 0.005 % ophthalmic solution  Commonly known as:  XALATAN  Place 1 drop into the right eye at bedtime.     loratadine 10 MG tablet  Commonly known as:  CLARITIN  Take 10 mg by mouth daily.     metoprolol succinate 50 MG 24 hr tablet  Commonly known as:  TOPROL-XL  Take 50 mg by mouth daily.     NAMENDA XR 28 MG Cp24 24 hr capsule  Generic drug:  memantine  Take by mouth.     omeprazole 20 MG capsule  Commonly known as:  PRILOSEC  Take 20 mg by mouth 2 (two) times daily before a meal.     PARoxetine 20 MG tablet  Commonly known as:  PAXIL  Take 20 mg by mouth daily.     polyethylene glycol packet  Commonly known as:  MIRALAX / GLYCOLAX  Take 17 g by mouth daily.     polyvinyl alcohol 1.4 % ophthalmic solution  Commonly known as:  LIQUIFILM TEARS  Place 1 drop into both eyes. One drop four times daily as needed     triamcinolone cream 0.1 %  Commonly known as:  KENALOG  Apply 1 application topically. Apply thin layer to arms, legs and back as needed     VITAMIN B 12 PO  Take 1,000 mcg by mouth daily.     Vitamin D 2000 units Caps  Take by mouth.        Review of Systems  Unable to perform ROS: Dementia    Immunization History  Administered Date(s) Administered  . Influenza-Unspecified 05/21/2013, 04/26/2014, 05/04/2015   . Pneumococcal Polysaccharide-23 10/11/2009  . Td 10/11/2011  . Zoster 10/17/2010   Pertinent  Health Maintenance Due  Topic Date Due  . FOOT EXAM  08[birth year]05/1933  . OPHTHALMOLOGY EXAM  08/05/1933  . URINE MICROALBUMIN  08/05/1933  . PNA vac Low Risk Adult (2 of 2 - PCV13) 10/12/2010  .  COLONOSCOPY  10/21/2013  . INFLUENZA VACCINE  02/20/2016  . HEMOGLOBIN A1C  02/28/2016   Fall Risk  03/16/2015 11/07/2014  Falls in the past year? No No  Risk for fall due to : History of fall(s) Impaired balance/gait;Impaired mobility;Medication side effect;History of fall(s)   Wt Readings from Last 3 Encounters:  01/22/16 198 lb (89.812 kg)  01/10/16 202 lb (91.627 kg)  11/27/15 198 lb (89.812 kg)    Physical Exam  Constitutional: No distress.  HENT:  Head: Normocephalic and atraumatic.  Right Ear: No drainage, swelling or tenderness. Decreased hearing is noted.  Left Ear: Tympanic membrane and ear canal normal. Decreased hearing is noted.  Nose: Nose normal. Right sinus exhibits no maxillary sinus tenderness and no frontal sinus tenderness. Left sinus exhibits no maxillary sinus tenderness and no frontal sinus tenderness.  Eyes: Pupils are equal, round, and reactive to light. Right eye exhibits discharge. Left eye exhibits discharge.  Erythema to bilat conjuctiva  Neck: No JVD present.  Cardiovascular: Normal rate and regular rhythm.   No murmur heard. Pulmonary/Chest: Effort normal and breath sounds normal. No respiratory distress.  Abdominal: Soft. Bowel sounds are normal.  Lymphadenopathy:    He has no cervical adenopathy.  Neurological: He is alert.  Oriented to self only  Skin: Skin is warm and dry. He is not diaphoretic.  Psychiatric: He has a normal mood and affect.    Labs reviewed:  Recent Labs  01/25/15 02/09/15 08/31/15  NA 142 141 142  K 5.3 5.0 4.8  BUN 30* 30* 31*  CREATININE 1.6* 1.5* 1.7*    Recent Labs  02/09/15  AST 13*  ALT 12  ALKPHOS 65    Recent Labs   01/25/15 02/09/15 08/31/15  WBC 6.8 8.7 7.1  HGB 10.5* 9.6* 10.3*  HCT 32* 29* 31*  PLT 242 225 208   Lab Results  Component Value Date   TSH 2.41 07/27/2014   Lab Results  Component Value Date   HGBA1C 7.5 08/31/2015   HGBA1C 7.5 08/31/2015   Lab Results  Component Value Date   CHOL 150 09/17/2013   HDL 57 09/17/2013   LDLCALC 72 09/17/2013   TRIG 107 09/17/2013   Lab Results  Component Value Date   ALT 12 02/09/2015   AST 13* 02/09/2015   ALKPHOS 65 02/09/2015   BILITOT 0.40 01/13/2013  11/30/15: Depakote level 12   Significant Diagnostic Results in last 30 days:  No results found.  Assessment/Plan  1) Conjunctivitis Gentamicin 0.3% 2 gtts OU QID x 5 days  2. Late onset Alzheimer's disease with behavioral disturbance Stable Continue depakote and namenda  3. Anemia, unspecified anemia type Stable Check CBC next month  4. Convulsions, unspecified convulsion type ([place]) Hx of this no new events, occasionally has a tremor to his arms that subsides and some wonder if this is seizure activity ?? (not frequent)  5. Type 2 diabetes mellitus with stage 3 chronic kidney disease, with long-term current use of insulin (HCC) Controlled Continue lantus A1C next month  6. CKD (chronic kidney disease) stage 3, GFR 30-59 ml/min Stable Continue to monitor  7. Hearing loss, bilateral Wears hearing aids  Cindi Carbon, [place] [phone]

## 2016-02-13 ENCOUNTER — Non-Acute Institutional Stay (SKILLED_NURSING_FACILITY): Payer: Medicare Other | Admitting: Internal Medicine

## 2016-02-13 ENCOUNTER — Encounter: Payer: Self-pay | Admitting: Internal Medicine

## 2016-02-13 DIAGNOSIS — N183 Chronic kidney disease, stage 3 unspecified: Secondary | ICD-10-CM

## 2016-02-13 DIAGNOSIS — H409 Unspecified glaucoma: Secondary | ICD-10-CM

## 2016-02-13 DIAGNOSIS — D509 Iron deficiency anemia, unspecified: Secondary | ICD-10-CM | POA: Diagnosis not present

## 2016-02-13 DIAGNOSIS — E1122 Type 2 diabetes mellitus with diabetic chronic kidney disease: Secondary | ICD-10-CM

## 2016-02-13 DIAGNOSIS — F02818 Dementia in other diseases classified elsewhere, unspecified severity, with other behavioral disturbance: Secondary | ICD-10-CM

## 2016-02-13 DIAGNOSIS — K5909 Other constipation: Secondary | ICD-10-CM

## 2016-02-13 DIAGNOSIS — Z794 Long term (current) use of insulin: Secondary | ICD-10-CM | POA: Diagnosis not present

## 2016-02-13 DIAGNOSIS — F0281 Dementia in other diseases classified elsewhere with behavioral disturbance: Secondary | ICD-10-CM

## 2016-02-13 DIAGNOSIS — K5900 Constipation, unspecified: Secondary | ICD-10-CM

## 2016-02-13 DIAGNOSIS — E785 Hyperlipidemia, unspecified: Secondary | ICD-10-CM

## 2016-02-13 DIAGNOSIS — G301 Alzheimer's disease with late onset: Secondary | ICD-10-CM

## 2016-02-13 NOTE — Progress Notes (Signed)
Patient ID: [name], male   DOB: May 05, 1922, 82 y.o.   MRN: [id number]  Location:   [facility] Room Number: [id number] of Service:  SNF (31) Provider:  Kushal Saunders L. Mariea Clonts, D.O., C.M.D.  Hollace Kinnier, DO  Patient Care Team: [name], DO as PCP - General (Geriatric Medicine) Raynelle Bring, MD as Consulting Physician (Urology) Jarome Matin, MD as Consulting Physician (Dermatology)  Extended Emergency Contact Information Primary Emergency Contact: [name] Address: [address]          [place], [place] [address] [name] of [place] Phone: [phone] Mobile Phone: [phone] Relation: Son  Code Status:  DNR Goals of care: Advanced Directive information Advanced Directives 02/13/2016  Does patient have an advance directive? Yes  Type of Advance Directive Out of facility DNR (pink MOST or yellow form);Healthcare Power of Attorney  Does patient want to make changes to advanced directive? -  Copy of advanced directive(s) in chart? Yes  Pre-existing out of facility DNR order (yellow form or pink MOST form) Yellow form placed in chart (order not valid for inpatient use);Pink MOST form placed in chart (order not valid for inpatient use)   Chief Complaint  Patient presents with  . Medical Management of Chronic Issues    routine visit   HPI:  Pt is a 82 y.o. male seen today for medical management of chronic diseases.    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  He is maintained on paxil for that and has not done well when it's been discontinued or lowered in the past.  He's also on depakote.  He has diabetes and is maintained on lantus 5units with am cbgs.  Control has been good for his age and comorbidities.  I wondered if he really needs to be maintained on insulin when he's on such a low dose.  Last hba1c 7.5. In February.  He is typically seen reading in the hallway.  He has glaucoma on drops.  He also  has takes iron for iron deficiency anemia and miralax for his constipation.  He had heme positive stool and was not worked up due to his debility.  H/h has been stable with slight improvement on last lab in 2/17.  Past Medical History:  Diagnosis Date  . Actinic keratosis   . Alzheimer disease 09/13/2008   Qualifier: Diagnosis of  By: Nils Pyle CMA ([place]), Mearl Latin    . Anemia 2012  . Arthritis   . Cervicalgia 1998   degenereative disease of CS and foraminal narrowing at C5-6  . Coronary atherosclerosis of native coronary artery 2002   60% stenois of the 1st diagonal of the LAD  . Deaf   . Diabetes mellitus 1997  . Epilepsy ([place]) 1943  . Gastric ulcer 2012  . Generalized osteoarthrosis, unspecified site   . Glaucoma   . H/O prostate cancer 2011   treated with radiation  . History of fall 2013  . Hyperlipidemia   . Hypertension 2008  . Macular degeneration (senile) of retina, unspecified   . Major depressive disorder, single episode, unspecified ([place])   . Osteoarthritis of knee 10/07/2013   Qualifier: Diagnosis of  By: Nils Pyle CMA (AAMA), Mearl Latin    . Other B-complex deficiencies 2012  . Personal history of colonic polyps 2012   adenomatous  . Reflux esophagitis 2002  . Type II or unspecified type diabetes mellitus with peripheral circulatory disorders, not stated as uncontrolled(250.70) 05/24/2009   Qualifier: Diagnosis of  ByCaryl Comes, MD, [facility], [facility] Guise   . Unspecified venous (peripheral) insufficiency   . Unspecified vitamin D deficiency 2010   Past Surgical History:  Procedure Laterality Date  . APPENDECTOMY  1947  . CARPAL TUNNEL RELEASE Bilateral 2005  . COLONOSCOPY  10/22/10   multiple adenomatous polyps  . MYRINGOTOMY Right   . TONSILLECTOMY  1930    Allergies  Allergen Reactions  . Aspirin   . Nsaids       Medication List       Accurate as of 02/13/16 12:18 PM. Always use your most recent med list.          acetaminophen 325 MG tablet Commonly known as:   TYLENOL Take 650 mg by mouth 3 (three) times daily.   dextromethorphan 30 MG/5ML liquid Commonly known as:  DELSYM Take by mouth. 1 tsp every 12 hours as needed for cough   divalproex 125 MG capsule Commonly known as:  DEPAKOTE SPRINKLE Take 125 mg by mouth 2 (two) times daily.   ferrous sulfate 325 (65 FE) MG tablet Take 325 mg by mouth daily with breakfast.   fluocinonide cream 0.05 % Commonly known as:  LIDEX Apply 1 application topically 2 (two) times daily. Apply to hands BID as needed   insulin glargine 100 UNIT/ML injection Commonly known as:  LANTUS Inject 5 Units into the skin at bedtime.   latanoprost 0.005 % ophthalmic solution Commonly known as:  XALATAN Place 1 drop into the right eye at bedtime.   loratadine 10 MG tablet Commonly known as:  CLARITIN Take 10 mg by mouth daily.   metoprolol succinate 50 MG 24 hr tablet Commonly known as:  TOPROL-XL Take 50 mg by mouth daily.   NAMENDA XR 28 MG Cp24 24 hr capsule Generic drug:  memantine Take by mouth.   omeprazole 20 MG capsule Commonly known as:  PRILOSEC Take 20 mg by mouth 2 (two) times daily before a meal.   PARoxetine 20 MG tablet Commonly known as:  PAXIL Take 20 mg by mouth daily.   polyethylene glycol packet Commonly known as:  MIRALAX / GLYCOLAX Take 17 g by mouth daily.   polyvinyl alcohol 1.4 % ophthalmic solution Commonly known as:  LIQUIFILM TEARS Place 1 drop into both eyes. One drop four times daily as needed   triamcinolone cream 0.1 % Commonly known as:  KENALOG Apply 1 application topically. Apply thin layer to arms, legs and back as needed   VITAMIN B 12 PO Take 1,000 mcg by mouth daily.   Vitamin D 2000 units Caps Take by mouth.       Review of Systems  Unable to perform ROS: Dementia  see hpi from nursing staff  Immunization History  Administered Date(s) Administered  . Influenza-Unspecified 05/21/2013, 04/26/2014, 05/04/2015  . Pneumococcal Polysaccharide-23  10/11/2009  . Td 10/11/2011  . Zoster 10/17/2010   Pertinent  Health Maintenance Due  Topic Date Due  . FOOT EXAM  08/05/1933  . OPHTHALMOLOGY EXAM  08/05/1933  . URINE MICROALBUMIN  08/05/1933  . PNA vac Low Risk Adult (2 of 2 - PCV13) 10/12/2010  . COLONOSCOPY  10/21/2013  . INFLUENZA VACCINE  02/20/2016  . HEMOGLOBIN A1C  02/28/2016   Fall Risk  03/16/2015 11/07/2014  Falls in the past year? No No  Risk for fall due to : History of fall(s) Impaired balance/gait;Impaired mobility;Medication side effect;History of fall(s)    Vitals:   02/13/16 1213  BP: 140/75  Pulse: 95  Resp: 18  Temp: 98.8 F (37.1 C)  TempSrc: Oral  SpO2: 99%   There is no height or weight on file to calculate BMI. Physical Exam  Constitutional: He appears well-developed and well-nourished. No distress.  Cardiovascular: Normal rate, regular rhythm, normal heart sounds and intact distal pulses.   Pulmonary/Chest: Effort normal and breath sounds normal.  Abdominal: Soft. Bowel sounds are normal.  Musculoskeletal: Normal range of motion.  Neurological: He is alert.  Skin: Skin is warm and dry.   Labs reviewed:  Recent Labs  08/31/15  NA 142  K 4.8  BUN 31*  CREATININE 1.7*   No results for input(s): AST, ALT, ALKPHOS, BILITOT, PROT, ALBUMIN in the last 8760 hours.  Recent Labs  08/31/15  WBC 7.1  HGB 10.3*  HCT 31*  PLT 208   Lab Results  Component Value Date   TSH 2.41 07/27/2014   Lab Results  Component Value Date   HGBA1C 7.5 08/31/2015   HGBA1C 7.5 08/31/2015   Lab Results  Component Value Date   CHOL 150 09/17/2013   HDL 57 09/17/2013   LDLCALC 72 09/17/2013   TRIG 107 09/17/2013   Assessment/Plan 1. Type 2 diabetes mellitus with stage 3 chronic kidney disease, with long-term current use of insulin (HCC) -has been well controlled with daily lantus insulin, but would like to spare him getting shots -will d/c lantus and try to use orals -start tradjenta 5mg  po daily with  largest meal -cont to monitor CBGs daily -check hba1c after 3 mos -if sugars are staying over 200 on new routine after at least a week, would consider adding a weekly injection of trulicity low dose   2. Chronic constipation -controlled with miralax regimen  3. Iron deficiency anemia -cont iron supplement--no workup with his advanced dementia and would not tolerate it anyway  4. Late onset Alzheimer's disease with behavioral disturbance -namenda XR continued due to potential behavioral benefit, but, might still consider d/c soon due to advanced stage and functional dependence  5. Glaucoma -continues on latanoprost drops and still at least trying to read  6. Hyperlipidemia -no longer on medication due to goals of care being comfort at this point  Family/ staff Communication: discussed with 2nd shift SNF nurse  Labs/tests ordered:  No new today

## 2016-02-15 ENCOUNTER — Non-Acute Institutional Stay (SKILLED_NURSING_FACILITY): Payer: Medicare Other | Admitting: Adult Health

## 2016-02-15 ENCOUNTER — Encounter: Payer: Self-pay | Admitting: Adult Health

## 2016-02-15 DIAGNOSIS — R05 Cough: Secondary | ICD-10-CM | POA: Diagnosis not present

## 2016-02-15 DIAGNOSIS — J209 Acute bronchitis, unspecified: Secondary | ICD-10-CM | POA: Diagnosis not present

## 2016-02-15 DIAGNOSIS — H66016 Acute suppurative otitis media with spontaneous rupture of ear drum, recurrent, bilateral: Secondary | ICD-10-CM | POA: Diagnosis not present

## 2016-02-15 NOTE — Progress Notes (Signed)
Patient ID: [name], male   DOB: September 19, 1921, 82 y.o.   MRN: [id number]   Location[facility]   [facility]   Place of Service:  SNF (31) Provider:   Cindi Carbon, ANP [facility] [phone]   REED, Jonelle Sidle, DO  Patient Care Team: [name], DO as PCP - General (Geriatric Medicine) Raynelle Bring, MD as Consulting Physician (Urology) Jarome Matin, MD as Consulting Physician (Dermatology)  Extended Emergency Contact Information Primary Emergency Contact: Weatherspoon,[name] Address: [address]          [place], [place] [address] [name] of [place] Phone: [phone] Mobile Phone: [phone] Relation: Son  Code Status:  DNR Goals of care: Advanced Directive information Advanced Directives 02/13/2016  Does patient have an advance directive? Yes  Type of Advance Directive Out of facility DNR (pink MOST or yellow form);Healthcare Power of Attorney  Does patient want to make changes to advanced directive? -  Copy of advanced directive(s) in chart? Yes  Pre-existing out of facility DNR order (yellow form or pink MOST form) Yellow form placed in chart (order not valid for inpatient use);Pink MOST form placed in chart (order not valid for inpatient use)     Chief Complaint  Patient presents with  . Acute Visit    ear drainage, cough    HPI:  Pt is a 82 y.o. male seen today for an acute visit for cough present for 3 days that is pronounce and productive.  He has not had sob or fever. Sats were in the mid 80's this am but improved to 90%.  He has advanced dementia and can be combative with care.  He has a hx of myringotomy as well as frequent otitis, as well as DM II.  Staff reports drainage from the left ear today. He is alert and otherwise in his usual state of health.    Past Medical History:  Diagnosis Date  . Actinic keratosis   . Alzheimer disease 09/13/2008   Qualifier: Diagnosis of  By: Nils Pyle CMA ([place]), Mearl Latin    . Anemia 2012  . Arthritis   .  Cervicalgia 1998   degenereative disease of CS and foraminal narrowing at C5-6  . Coronary atherosclerosis of native coronary artery 2002   60% stenois of the 1st diagonal of the LAD  . Deaf   . Diabetes mellitus 1997  . Epilepsy ([place]) 1943  . Gastric ulcer 2012  . Generalized osteoarthrosis, unspecified site   . Glaucoma   . H/O prostate cancer 2011   treated with radiation  . History of fall 2013  . Hyperlipidemia   . Hypertension 2008  . Macular degeneration (senile) of retina, unspecified   . Major depressive disorder, single episode, unspecified ([place])   . Osteoarthritis of knee 10/07/2013   Qualifier: Diagnosis of  By: Nils Pyle CMA (AAMA), Mearl Latin    . Other B-complex deficiencies 2012  . Personal history of colonic polyps 2012   adenomatous  . Reflux esophagitis 2002  . Type II or unspecified type diabetes mellitus with peripheral circulatory disorders, not stated as uncontrolled(250.70) 05/24/2009   Qualifier: Diagnosis of  By: Caryl Comes, MD, [facility]   . Unspecified venous (peripheral) insufficiency   . Unspecified vitamin D deficiency 2010   Past Surgical History:  Procedure Laterality Date  . APPENDECTOMY  1947  . CARPAL TUNNEL RELEASE Bilateral 2005  . COLONOSCOPY  10/22/10   multiple adenomatous polyps  . MYRINGOTOMY Right   . TONSILLECTOMY  1930  Allergies  Allergen Reactions  . Aspirin   . Nsaids       Medication List       Accurate as of 02/15/16 10:19 AM. Always use your most recent med list.          acetaminophen 325 MG tablet Commonly known as:  TYLENOL Take 650 mg by mouth 3 (three) times daily.   dextromethorphan 30 MG/5ML liquid Commonly known as:  DELSYM Take by mouth. 1 tsp every 12 hours as needed for cough   divalproex 125 MG capsule Commonly known as:  DEPAKOTE SPRINKLE Take 125 mg by mouth 2 (two) times daily.   ferrous sulfate 325 (65 FE) MG tablet Take 325 mg by mouth daily with breakfast.   fluocinonide cream 0.05  % Commonly known as:  LIDEX Apply 1 application topically 2 (two) times daily. Apply to hands BID as needed   insulin glargine 100 UNIT/ML injection Commonly known as:  LANTUS Inject 5 Units into the skin at bedtime.   latanoprost 0.005 % ophthalmic solution Commonly known as:  XALATAN Place 1 drop into the right eye at bedtime.   loratadine 10 MG tablet Commonly known as:  CLARITIN Take 10 mg by mouth daily.   metoprolol succinate 50 MG 24 hr tablet Commonly known as:  TOPROL-XL Take 50 mg by mouth daily.   NAMENDA XR 28 MG Cp24 24 hr capsule Generic drug:  memantine Take by mouth.   omeprazole 20 MG capsule Commonly known as:  PRILOSEC Take 20 mg by mouth 2 (two) times daily before a meal.   PARoxetine 20 MG tablet Commonly known as:  PAXIL Take 20 mg by mouth daily.   polyethylene glycol packet Commonly known as:  MIRALAX / GLYCOLAX Take 17 g by mouth daily.   polyvinyl alcohol 1.4 % ophthalmic solution Commonly known as:  LIQUIFILM TEARS Place 1 drop into both eyes. One drop four times daily as needed   triamcinolone cream 0.1 % Commonly known as:  KENALOG Apply 1 application topically. Apply thin layer to arms, legs and back as needed   VITAMIN B 12 PO Take 1,000 mcg by mouth daily.   Vitamin D 2000 units Caps Take by mouth.       Review of Systems  Unable to perform ROS: Dementia    Immunization History  Administered Date(s) Administered  . Influenza-Unspecified 05/21/2013, 04/26/2014, 05/04/2015  . Pneumococcal Polysaccharide-23 10/11/2009  . Td 10/11/2011  . Zoster 10/17/2010   Pertinent  Health Maintenance Due  Topic Date Due  . FOOT EXAM  08/05/1933  . OPHTHALMOLOGY EXAM  08/05/1933  . URINE MICROALBUMIN  08/05/1933  . PNA vac Low Risk Adult (2 of 2 - PCV13) 10/12/2010  . COLONOSCOPY  10/21/2013  . INFLUENZA VACCINE  02/20/2016  . HEMOGLOBIN A1C  02/28/2016   Fall Risk  03/16/2015 11/07/2014  Falls in the past year? No No  Risk for  fall due to : History of fall(s) Impaired balance/gait;Impaired mobility;Medication side effect;History of fall(s)   Functional Status Survey:    Vitals:   02/15/16 1016  BP: 115/70  Pulse: (!) 59  Resp: 20  Temp: 98.4 F (36.9 C)  SpO2: 90%   There is no height or weight on file to calculate BMI. Physical Exam  Constitutional: No distress.  HENT:  Head: Normocephalic and atraumatic.  Clear/white drainage to the OP area.  Purulent matter in both ear canals, rupture noted to the left ear TM, fluid noted to the right TM.  Clear/white drainage to both nares  Eyes: Conjunctivae and EOM are normal. Pupils are equal, round, and reactive to light. Right eye exhibits no discharge.  Neck: Normal range of motion. Neck supple. No JVD present.  Cardiovascular: Normal rate and regular rhythm.   No murmur heard. Pulmonary/Chest: Effort normal. No respiratory distress. He has wheezes.  Abdominal: Soft. Bowel sounds are normal. He exhibits no distension.  Lymphadenopathy:    He has no cervical adenopathy.  Neurological: He is alert.  Oriented to self only  Skin: Skin is warm and dry. He is not diaphoretic.  Psychiatric: He has a normal mood and affect.    Labs reviewed:  Recent Labs  08/31/15  NA 142  K 4.8  BUN 31*  CREATININE 1.7*   No results for input(s): AST, ALT, ALKPHOS, BILITOT, PROT, ALBUMIN in the last 8760 hours.  Recent Labs  08/31/15  WBC 7.1  HGB 10.3*  HCT 31*  PLT 208   Lab Results  Component Value Date   TSH 2.41 07/27/2014   Lab Results  Component Value Date   HGBA1C 7.5 08/31/2015   HGBA1C 7.5 08/31/2015   Lab Results  Component Value Date   CHOL 150 09/17/2013   HDL 57 09/17/2013   LDLCALC 72 09/17/2013   TRIG 107 09/17/2013    Significant Diagnostic Results in last 30 days:  No results found.  Assessment/Plan 1. Recurrent acute suppurative otitis media with spontaneous rupture of both tympanic membranes Cipro otic 0.24ml BID to both ears  for 7 days (must be lying down for admin) Consider ENT referral due to frequent recurrence with hx of myringotomy, however, given his dementia and combativeness I am not sure this would be fruitful  2. Acute bronchitis, unspecified organism Duoneb TID for 5 days and q 6 hrs prn Encourage fluid  CXR 2 view rule out pna    Family/ staff Communication: discussed with staff  Labs/tests ordered: CXR

## 2016-02-22 DIAGNOSIS — H7202 Central perforation of tympanic membrane, left ear: Secondary | ICD-10-CM | POA: Diagnosis not present

## 2016-03-14 ENCOUNTER — Non-Acute Institutional Stay (SKILLED_NURSING_FACILITY): Payer: Medicare Other | Admitting: Adult Health

## 2016-03-14 ENCOUNTER — Encounter: Payer: Self-pay | Admitting: Adult Health

## 2016-03-14 DIAGNOSIS — I10 Essential (primary) hypertension: Secondary | ICD-10-CM

## 2016-03-14 DIAGNOSIS — J209 Acute bronchitis, unspecified: Secondary | ICD-10-CM | POA: Diagnosis not present

## 2016-03-14 DIAGNOSIS — E1122 Type 2 diabetes mellitus with diabetic chronic kidney disease: Secondary | ICD-10-CM | POA: Diagnosis not present

## 2016-03-14 DIAGNOSIS — D509 Iron deficiency anemia, unspecified: Secondary | ICD-10-CM | POA: Diagnosis not present

## 2016-03-14 DIAGNOSIS — R195 Other fecal abnormalities: Secondary | ICD-10-CM

## 2016-03-14 DIAGNOSIS — N183 Chronic kidney disease, stage 3 unspecified: Secondary | ICD-10-CM

## 2016-03-14 DIAGNOSIS — Z794 Long term (current) use of insulin: Secondary | ICD-10-CM

## 2016-03-14 DIAGNOSIS — R1314 Dysphagia, pharyngoesophageal phase: Secondary | ICD-10-CM

## 2016-03-14 DIAGNOSIS — E559 Vitamin D deficiency, unspecified: Secondary | ICD-10-CM

## 2016-03-14 DIAGNOSIS — K5900 Constipation, unspecified: Secondary | ICD-10-CM

## 2016-03-14 DIAGNOSIS — K5909 Other constipation: Secondary | ICD-10-CM

## 2016-03-14 NOTE — Progress Notes (Signed)
Patient ID: [name], male   DOB: February 10, 1922, 82 y.o.   MRN: [id number]  Location:   [facility]   Place of Service:  SNF (31) Provider:   Cindi Carbon, ANP [facility] [phone]   REED, Jonelle Sidle, DO  Patient Care Team: [name], DO as PCP - General (Geriatric Medicine) Raynelle Bring, MD as Consulting Physician (Urology) Jarome Matin, MD as Consulting Physician (Dermatology)  Extended Emergency Contact Information Primary Emergency Contact: Copes,[name] Address: [address]          [place], [place] [address] [name] of [place] Phone: [phone] Mobile Phone: [phone] Relation: Son  Code Status:  DNR Goals of care: Advanced Directive information Advanced Directives 03/14/2016  Does patient have an advance directive? Yes  Type of Advance Directive Out of facility DNR (pink MOST or yellow form);Healthcare Power of Attorney  Does patient want to make changes to advanced directive? -  Copy of advanced directive(s) in chart? Yes  Pre-existing out of facility DNR order (yellow form or pink MOST form) Yellow form placed in chart (order not valid for inpatient use);Pink MOST form placed in chart (order not valid for inpatient use)     Chief Complaint  Patient presents with  . Medical Management of Chronic Issues    HPI:  82 y.o.  residing at [facility] skilled section, here for evaluation of chronic medical issues.  Has had a congested cough for several weeks. Treated for otitis media with TM perforation July 27 th with resolution of symptoms.  ENT consulted with no new orders.   Heme positive stool Noted in 2015 with drop in H/H.  No work up due to age and dementia.  Place on iron therapy and PPI therapy with stabilization in H/H and no further symptoms  3. Iron deficiency anemia 2/9 H/H 10.3/31 Currently on ferrous sulfate daily  4. CKD (chronic kidney disease) stage 3, GFR 30-59 ml/min BUN/CR 31/1.7 on  08/31/15 baseline  5. Type 2 diabetes mellitus with stage 3 chronic kidney disease, with long-term current use of insulin (HCC) CBGS range 140-160's with one outlier over 200 On tradjenta 5 mg qd  6. Vitamin D deficiency On Vit d supplementation 2000 units qd  7. Dysphagia, pharyngoesophageal phase mech soft ground meat diet with thickened liquids.   Has recurrent bronchitis/cough issues  8. Chronic constipation Regular BMs on miralax  9. Essential hypertension BP 140/80 on Toprol  Goal <150/90   Functional status: Hoyer lift, incontinent   Past Medical History:  Diagnosis Date  . Actinic keratosis   . Alzheimer disease 09/13/2008   Qualifier: Diagnosis of  By: Nils Pyle CMA ([place]), Mearl Latin    . Anemia 2012  . Arthritis   . Cervicalgia 1998   degenereative disease of CS and foraminal narrowing at C5-6  . Coronary atherosclerosis of native coronary artery 2002   60% stenois of the 1st diagonal of the LAD  . Deaf   . Diabetes mellitus 1997  . Epilepsy ([place]) 1943  . Gastric ulcer 2012  . Generalized osteoarthrosis, unspecified site   . Glaucoma   . H/O prostate cancer 2011   treated with radiation  . History of fall 2013  . Hyperlipidemia   . Hypertension 2008  . Macular degeneration (senile) of retina, unspecified   . Major depressive disorder, single episode, unspecified ([place])   . Osteoarthritis of knee 10/07/2013   Qualifier: Diagnosis of  By: Nils Pyle CMA (AAMA), Mearl Latin    . Other B-complex  deficiencies 2012  . Personal history of colonic polyps 2012   adenomatous  . Reflux esophagitis 2002  . Type II or unspecified type diabetes mellitus with peripheral circulatory disorders, not stated as uncontrolled(250.70) 05/24/2009   Qualifier: Diagnosis of  By: Caryl Comes, MD, Remus Blake   . Unspecified venous (peripheral) insufficiency   . Unspecified vitamin D deficiency 2010   Past Surgical History:  Procedure Laterality Date  . APPENDECTOMY  1947  . CARPAL TUNNEL  RELEASE Bilateral 2005  . COLONOSCOPY  10/22/10   multiple adenomatous polyps  . MYRINGOTOMY Right   . TONSILLECTOMY  1930    Allergies  Allergen Reactions  . Aspirin   . Nsaids       Medication List       Accurate as of 03/14/16 11:59 PM. Always use your most recent med list.          acetaminophen 325 MG tablet Commonly known as:  TYLENOL Take 650 mg by mouth 3 (three) times daily.   dextromethorphan 30 MG/5ML liquid Commonly known as:  DELSYM Take by mouth. 1 tsp every 12 hours as needed for cough   divalproex 125 MG capsule Commonly known as:  DEPAKOTE SPRINKLE Take 125 mg by mouth 2 (two) times daily.   ferrous sulfate 325 (65 FE) MG tablet Take 325 mg by mouth daily with breakfast.   fluocinonide cream 0.05 % Commonly known as:  LIDEX Apply 1 application topically 2 (two) times daily. Apply to hands BID as needed   GUAIFEN DM PO Take 15 mLs by mouth 3 (three) times daily.   latanoprost 0.005 % ophthalmic solution Commonly known as:  XALATAN Place 1 drop into the right eye at bedtime.   linagliptin 5 MG Tabs tablet Commonly known as:  TRADJENTA Take 5 mg by mouth daily.   loratadine 10 MG tablet Commonly known as:  CLARITIN Take 10 mg by mouth daily.   metoprolol succinate 50 MG 24 hr tablet Commonly known as:  TOPROL-XL Take 50 mg by mouth daily.   NAMENDA XR 28 MG Cp24 24 hr capsule Generic drug:  memantine Take by mouth.   omeprazole 20 MG capsule Commonly known as:  PRILOSEC Take 20 mg by mouth 2 (two) times daily before a meal.   PARoxetine 20 MG tablet Commonly known as:  PAXIL Take 20 mg by mouth daily.   polyethylene glycol packet Commonly known as:  MIRALAX / GLYCOLAX Take 17 g by mouth daily.   polyvinyl alcohol 1.4 % ophthalmic solution Commonly known as:  LIQUIFILM TEARS Place 1 drop into both eyes. One drop four times daily as needed   triamcinolone cream 0.1 % Commonly known as:  KENALOG Apply 1 application topically.  Apply thin layer to arms, legs and back as needed   VITAMIN B 12 PO Take 1,000 mcg by mouth daily.   Vitamin D 2000 units Caps Take by mouth.       Review of Systems  Unable to perform ROS: Dementia    Immunization History  Administered Date(s) Administered  . Influenza-Unspecified 05/21/2013, 04/26/2014, 05/04/2015  . Pneumococcal Polysaccharide-23 10/11/2009  . Td 10/11/2011  . Zoster 10/17/2010   Pertinent  Health Maintenance Due  Topic Date Due  . FOOT EXAM  08/05/1933  . OPHTHALMOLOGY EXAM  08/05/1933  . URINE MICROALBUMIN  08/05/1933  . PNA vac Low Risk Adult (2 of 2 - PCV13) 10/12/2010  . COLONOSCOPY  10/21/2013  . INFLUENZA VACCINE  02/20/2016  . HEMOGLOBIN A1C  02/28/2016   Fall Risk  03/16/2015 11/07/2014  Falls in the past year? No No  Risk for fall due to : History of fall(s) Impaired balance/gait;Impaired mobility;Medication side effect;History of fall(s)   Wt Readings from Last 3 Encounters:  03/14/16 198 lb (89.8 kg)  01/22/16 198 lb (89.8 kg)  01/10/16 202 lb (91.6 kg)    Physical Exam  Constitutional: No distress.  HENT:  Head: Normocephalic and atraumatic.  Right Ear: No drainage, swelling or tenderness. Decreased hearing is noted.  Left Ear: Ear canal normal. No drainage or swelling. Decreased hearing is noted.  Nose: Nose normal. Right sinus exhibits no maxillary sinus tenderness and no frontal sinus tenderness. Left sinus exhibits no maxillary sinus tenderness and no frontal sinus tenderness.  Mouth/Throat: No oropharyngeal exudate.  Left TM healing perforation  Eyes: Pupils are equal, round, and reactive to light. Right eye exhibits no discharge. Left eye exhibits no discharge.  Neck: No JVD present.  Cardiovascular: Normal rate and regular rhythm.   No murmur heard. Pulmonary/Chest: Effort normal. No respiratory distress.  bilat rhonchi  Abdominal: Soft. Bowel sounds are normal.  Lymphadenopathy:    He has no cervical adenopathy.    Neurological: He is alert.  Oriented to self only  Skin: Skin is warm and dry. He is not diaphoretic.  Psychiatric: He has a normal mood and affect.    Labs reviewed:  Recent Labs  08/31/15  NA 142  K 4.8  BUN 31*  CREATININE 1.7*   No results for input(s): AST, ALT, ALKPHOS, BILITOT, PROT, ALBUMIN in the last 8760 hours.  Recent Labs  08/31/15  WBC 7.1  HGB 10.3*  HCT 31*  PLT 208   Lab Results  Component Value Date   TSH 2.41 07/27/2014   Lab Results  Component Value Date   HGBA1C 7.5 08/31/2015   HGBA1C 7.5 08/31/2015   Lab Results  Component Value Date   CHOL 150 09/17/2013   HDL 57 09/17/2013   LDLCALC 72 09/17/2013   TRIG 107 09/17/2013   Lab Results  Component Value Date   ALT 12 02/09/2015   AST 13 (A) 02/09/2015   ALKPHOS 65 02/09/2015   BILITOT 0.40 01/13/2013  11/30/15: Depakote level 12   Significant Diagnostic Results in last 30 days:  No results found.  Assessment/Plan  1) Acute bronchitis  Augmentin 875 mg BID x 7 days Florastor 1 cap BID x 7 days Duoneb BID and q 6 hrs prn  2. Heme positive stool -no further work up due to age/dementia -no frank bleeding or drop in H/H on iron  3. Iron deficiency anemia -Check CBC -continue iron supplementation  4. CKD (chronic kidney disease) stage 3, GFR 30-59 ml/min Check BMP Avoid nephrotoxic agents  5. Type 2 diabetes mellitus with stage 3 chronic kidney disease, with long-term current use of insulin (HCC) CBGs below 200 most of the time on Tradjenta 5 mg  Goal A1C < 8%, check in the am  6. Vitamin D deficiency No VIt D level monitoring due to cost, debility Continue Vit D at 2000 units qd  7. Dysphagia, pharyngoesophageal phase Continue to cough even with a modified diet Recurrent bouts of bronchitis noted Goals of care indicate no feeding tube, continue current diet and asp prec  8. Chronic constipation Continue miralax 17 gm qd  9. Essential  hypertension Controlled Continue Toprol XL 50 mg qd   Labs CBC, CMP, A1C  Cindi Carbon, ANP [facility] [phone]

## 2016-03-15 DIAGNOSIS — E119 Type 2 diabetes mellitus without complications: Secondary | ICD-10-CM | POA: Diagnosis not present

## 2016-03-15 DIAGNOSIS — D508 Other iron deficiency anemias: Secondary | ICD-10-CM | POA: Diagnosis not present

## 2016-03-15 DIAGNOSIS — D631 Anemia in chronic kidney disease: Secondary | ICD-10-CM | POA: Diagnosis not present

## 2016-03-15 LAB — CBC AND DIFFERENTIAL
HCT: 33 % — AB (ref 41–53)
HEMOGLOBIN: 11.1 g/dL — AB (ref 13.5–17.5)
Platelets: 218 10*3/uL (ref 150–399)
WBC: 10.6 10*3/mL

## 2016-03-15 LAB — HEPATIC FUNCTION PANEL
ALK PHOS: 66 U/L (ref 25–125)
ALT: 13 U/L (ref 10–40)
AST: 18 U/L (ref 14–40)
BILIRUBIN, TOTAL: 0.2 mg/dL

## 2016-03-15 LAB — BASIC METABOLIC PANEL
BUN: 26 mg/dL — AB (ref 4–21)
CREATININE: 1.6 mg/dL — AB (ref 0.6–1.3)
Glucose: 134 mg/dL
POTASSIUM: 5.2 mmol/L (ref 3.4–5.3)
SODIUM: 142 mmol/L (ref 137–147)

## 2016-03-15 LAB — HEMOGLOBIN A1C: HEMOGLOBIN A1C: 8.1

## 2016-04-01 DIAGNOSIS — J189 Pneumonia, unspecified organism: Secondary | ICD-10-CM | POA: Diagnosis not present

## 2016-04-11 ENCOUNTER — Non-Acute Institutional Stay (SKILLED_NURSING_FACILITY): Payer: Medicare Other | Admitting: Adult Health

## 2016-04-11 DIAGNOSIS — I10 Essential (primary) hypertension: Secondary | ICD-10-CM

## 2016-04-11 DIAGNOSIS — E1122 Type 2 diabetes mellitus with diabetic chronic kidney disease: Secondary | ICD-10-CM

## 2016-04-11 DIAGNOSIS — R1310 Dysphagia, unspecified: Secondary | ICD-10-CM

## 2016-04-11 DIAGNOSIS — N183 Chronic kidney disease, stage 3 unspecified: Secondary | ICD-10-CM

## 2016-04-11 DIAGNOSIS — G301 Alzheimer's disease with late onset: Secondary | ICD-10-CM

## 2016-04-11 DIAGNOSIS — D509 Iron deficiency anemia, unspecified: Secondary | ICD-10-CM | POA: Diagnosis not present

## 2016-04-11 DIAGNOSIS — F0281 Dementia in other diseases classified elsewhere with behavioral disturbance: Secondary | ICD-10-CM | POA: Diagnosis not present

## 2016-04-11 DIAGNOSIS — F02818 Alzheimer's disease with late onset: Secondary | ICD-10-CM

## 2016-04-11 NOTE — Progress Notes (Signed)
Patient ID: [name], male   DOB: Jul 07, 1922, 82 y.o.   MRN: [id number]  Location:   [facility]   Place of Service:  SNF (31) Provider:   Cindi Carbon, ANP [facility] [phone]   REED, Jonelle Sidle, DO  Patient Care Team: [name], DO as PCP - General (Geriatric Medicine) Raynelle Bring, MD as Consulting Physician (Urology) Jarome Matin, MD as Consulting Physician (Dermatology)  Extended Emergency Contact Information Primary Emergency Contact: Scholz,[name] Address: [address]          [place], [place] [address] [name] of [place] Phone: [phone] Mobile Phone: [phone] Relation: Son  Code Status:  DNR Goals of care: Advanced Directive information Advanced Directives 04/11/2016  Does patient have an advance directive? Yes  Type of Advance Directive Out of facility DNR (pink MOST or yellow form);Healthcare Power of Attorney  Does patient want to make changes to advanced directive? -  Copy of advanced directive(s) in chart? Yes  Pre-existing out of facility DNR order (yellow form or pink MOST form) Yellow form placed in chart (order not valid for inpatient use);Pink MOST form placed in chart (order not valid for inpatient use)     Chief Complaint  Patient presents with  . Medical Management of Chronic Issues    HPI:  82 y.o.  residing at [facility] skilled section, here for evaluation of chronic medical issues.   Treated for Bronchitis in August with Augmentin. Has chronic cough due to dysphagia, was placed on puree with NTL. Cough has improved. F/u xray of chest on 9/11 was clear.  A1C 8.1% in august but CBGS over the past few weeks have been better. Currently on tradjenta 5 mg qd  No other acute complaints BP controlled on metoprolol 50 mg qd. 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 No longer able to perform MMSE. Dependent on staff for all ADLs.  Also on Namenda 28 mg XR  daily    Functional status: Hoyer lift, incontinent   Past Medical History:  Diagnosis Date  . Actinic keratosis   . Alzheimer disease 09/13/2008   Qualifier: Diagnosis of  By: Nils Pyle CMA ([place]), Mearl Latin    . Anemia 2012  . Arthritis   . Cervicalgia 1998   degenereative disease of CS and foraminal narrowing at C5-6  . Coronary atherosclerosis of native coronary artery 2002   60% stenois of the 1st diagonal of the LAD  . Deaf   . Diabetes mellitus 1997  . Epilepsy ([place]) 1943  . Gastric ulcer 2012  . Generalized osteoarthrosis, unspecified site   . Glaucoma   . H/O prostate cancer 2011   treated with radiation  . History of fall 2013  . Hyperlipidemia   . Hypertension 2008  . Macular degeneration (senile) of retina, unspecified   . Major depressive disorder, single episode, unspecified ([place])   . Osteoarthritis of knee 10/07/2013   Qualifier: Diagnosis of  By: Nils Pyle CMA (AAMA), Mearl Latin    . Other B-complex deficiencies 2012  . Personal history of colonic polyps 2012   adenomatous  . Reflux esophagitis 2002  . Type II or unspecified type diabetes mellitus with peripheral circulatory disorders, not stated as uncontrolled(250.70) 05/24/2009   Qualifier: Diagnosis of  By: Caryl Comes, MD, Remus Blake   . Unspecified venous (peripheral) insufficiency   . Unspecified vitamin D deficiency 2010   Past Surgical History:  Procedure Laterality Date  . APPENDECTOMY  1947  . CARPAL TUNNEL RELEASE  Bilateral 2005  . COLONOSCOPY  10/22/10   multiple adenomatous polyps  . MYRINGOTOMY Right   . TONSILLECTOMY  1930    Allergies  Allergen Reactions  . Aspirin   . Nsaids       Medication List       Accurate as of 80/21/17  4:24 PM. Always use your most recent med list.          acetaminophen 325 MG tablet Commonly known as:  TYLENOL Take 650 mg by mouth 3 (three) times daily.   dextromethorphan 30 MG/5ML liquid Commonly known as:  DELSYM Take by mouth. 1 tsp every 12  hours as needed for cough   divalproex 125 MG capsule Commonly known as:  DEPAKOTE SPRINKLE Take 125 mg by mouth 2 (two) times daily.   ferrous sulfate 325 (65 FE) MG tablet Take 325 mg by mouth daily with breakfast.   GUAIFEN DM PO Take 15 mLs by mouth 3 (three) times daily.   ipratropium-albuterol 0.5-2.5 (3) MG/3ML Soln Commonly known as:  DUONEB Take 3 mLs by nebulization 2 (two) times daily.   latanoprost 0.005 % ophthalmic solution Commonly known as:  XALATAN Place 1 drop into the right eye at bedtime.   linagliptin 5 MG Tabs tablet Commonly known as:  TRADJENTA Take 5 mg by mouth daily.   loratadine 10 MG tablet Commonly known as:  CLARITIN Take 10 mg by mouth daily.   metoprolol succinate 50 MG 24 hr tablet Commonly known as:  TOPROL-XL Take 50 mg by mouth daily.   NAMENDA XR 28 MG Cp24 24 hr capsule Generic drug:  memantine Take by mouth.   omeprazole 20 MG capsule Commonly known as:  PRILOSEC Take 20 mg by mouth 2 (two) times daily before a meal.   PARoxetine 20 MG tablet Commonly known as:  PAXIL Take 20 mg by mouth daily.   polyethylene glycol packet Commonly known as:  MIRALAX / GLYCOLAX Take 17 g by mouth daily.   VITAMIN B 12 PO Take 1,000 mcg by mouth daily.   Vitamin D 2000 units Caps Take by mouth.       Review of Systems  Unable to perform ROS: Dementia    Immunization History  Administered Date(s) Administered  . Influenza-Unspecified 05/21/2013, 04/26/2014, 05/04/2015  . Pneumococcal Polysaccharide-23 10/11/2009  . Td 10/11/2011  . Zoster 10/17/2010   Pertinent  Health Maintenance Due  Topic Date Due  . FOOT EXAM  08/05/1933  . OPHTHALMOLOGY EXAM  08/05/1933  . URINE MICROALBUMIN  08/05/1933  . PNA vac Low Risk Adult (2 of 2 - PCV13) 10/12/2010  . COLONOSCOPY  10/21/2013  . INFLUENZA VACCINE  02/20/2016  . HEMOGLOBIN A1C  02/28/2016   Fall Risk  03/16/2015 11/07/2014  Falls in the past year? No No  Risk for fall due to  : History of fall(s) Impaired balance/gait;Impaired mobility;Medication side effect;History of fall(s)   Wt Readings from Last 3 Encounters:  04/11/16 199 lb 12.8 oz (90.6 kg)  03/14/16 198 lb (89.8 kg)  01/22/16 198 lb (89.8 kg)    Physical Exam  Constitutional: No distress.  HENT:  Head: Normocephalic and atraumatic.  Right Ear: No drainage, swelling or tenderness. Decreased hearing is noted.  Left Ear: Ear canal normal. No drainage or swelling. Decreased hearing is noted.  Nose: Right sinus exhibits no maxillary sinus tenderness and no frontal sinus tenderness. Left sinus exhibits no maxillary sinus tenderness and no frontal sinus tenderness.  Eyes: Pupils are equal, round, and reactive  to light. Right eye exhibits no discharge. Left eye exhibits no discharge.  Neck: No JVD present.  Cardiovascular: Normal rate and regular rhythm.   No murmur heard. Pulmonary/Chest: Effort normal and breath sounds normal. No respiratory distress.  Abdominal: Soft. Bowel sounds are normal.  Lymphadenopathy:    He has no cervical adenopathy.  Neurological: He is alert.  Oriented to self only  Skin: Skin is warm and dry. He is not diaphoretic.  Psychiatric: He has a normal mood and affect.  Nursing note and vitals reviewed.   Labs reviewed:  Recent Labs  08/31/15  NA 142  K 4.8  BUN 31*  CREATININE 1.7*   No results for input(s): AST, ALT, ALKPHOS, BILITOT, PROT, ALBUMIN in the last 8760 hours.  Recent Labs  08/31/15  WBC 7.1  HGB 10.3*  HCT 31*  PLT 208   Lab Results  Component Value Date   TSH 2.41 07/27/2014   Lab Results  Component Value Date   HGBA1C 7.5 08/31/2015   HGBA1C 7.5 08/31/2015   Lab Results  Component Value Date   CHOL 150 09/17/2013   HDL 57 09/17/2013   LDLCALC 72 09/17/2013   TRIG 107 09/17/2013   Lab Results  Component Value Date   ALT 12 02/09/2015   AST 13 (A) 02/09/2015   ALKPHOS 65 02/09/2015   BILITOT 0.40 01/13/2013  11/30/15: Depakote  level 12   Significant Diagnostic Results in last 30 days:  No results found.  Assessment/Plan  1. CKD (chronic kidney disease) stage 3, GFR 30-59 ml/min Stable renal function Continue to monitor and avoid nephrotoxic agents Would not initiate ace therapy due to age, debility, lack of benefit  2. Essential hypertension Controlled  Continue metoprolol 50 mg qd  3. Dysphagia Continue puree diet with ntl Continue duoneb bid D/c robafen  4. Late onset Alzheimer's disease with behavioral disturbance Advanced Continue namenda 28 mg XR daily  5. Iron deficiency anemia Stable H/H Continue ferrous sulfate 1 tab daily  6. Diabetes with renal complications Stable blood sugars A1C does not reflect recent cbgs. Continue tradjenta 5 mg daily   Cindi Carbon, ANP [facility] [phone]

## 2016-04-28 DIAGNOSIS — R05 Cough: Secondary | ICD-10-CM | POA: Diagnosis not present

## 2016-05-09 ENCOUNTER — Encounter: Payer: Self-pay | Admitting: Adult Health

## 2016-05-09 ENCOUNTER — Non-Acute Institutional Stay (SKILLED_NURSING_FACILITY): Payer: Medicare Other | Admitting: Adult Health

## 2016-05-09 DIAGNOSIS — H66005 Acute suppurative otitis media without spontaneous rupture of ear drum, recurrent, left ear: Secondary | ICD-10-CM | POA: Diagnosis not present

## 2016-05-09 DIAGNOSIS — Z23 Encounter for immunization: Secondary | ICD-10-CM | POA: Diagnosis not present

## 2016-05-09 DIAGNOSIS — E559 Vitamin D deficiency, unspecified: Secondary | ICD-10-CM | POA: Diagnosis not present

## 2016-05-09 DIAGNOSIS — E1122 Type 2 diabetes mellitus with diabetic chronic kidney disease: Secondary | ICD-10-CM | POA: Diagnosis not present

## 2016-05-09 DIAGNOSIS — G301 Alzheimer's disease with late onset: Secondary | ICD-10-CM | POA: Diagnosis not present

## 2016-05-09 DIAGNOSIS — N183 Chronic kidney disease, stage 3 (moderate): Secondary | ICD-10-CM | POA: Diagnosis not present

## 2016-05-09 DIAGNOSIS — F02818 Dementia in other diseases classified elsewhere, unspecified severity, with other behavioral disturbance: Secondary | ICD-10-CM

## 2016-05-09 DIAGNOSIS — K5901 Slow transit constipation: Secondary | ICD-10-CM | POA: Diagnosis not present

## 2016-05-09 DIAGNOSIS — Z8546 Personal history of malignant neoplasm of prostate: Secondary | ICD-10-CM | POA: Diagnosis not present

## 2016-05-09 DIAGNOSIS — R1312 Dysphagia, oropharyngeal phase: Secondary | ICD-10-CM | POA: Diagnosis not present

## 2016-05-09 DIAGNOSIS — F0281 Dementia in other diseases classified elsewhere with behavioral disturbance: Secondary | ICD-10-CM | POA: Diagnosis not present

## 2016-05-09 NOTE — Progress Notes (Addendum)
Patient ID: [name], male   DOB: 03-17-1922, 82 y.o.   MRN: [id number]   Location:   [facility]   Place of Service:  SNF (31) Provider:   Cindi Carbon, ANP [facility] [phone]   REED, Jonelle Sidle, DO  Patient Care Team: [name], DO as PCP - General (Geriatric Medicine) Raynelle Bring, MD as Consulting Physician (Urology) Jarome Matin, MD as Consulting Physician (Dermatology)  Extended Emergency Contact Information Primary Emergency Contact: Duck,[name] Address: [address]          [address], [place] [address] [name] of [place] Phone: [phone] Mobile Phone: [phone] Relation: Son  Code Status:  DNR Goals of care: Advanced Directive information Advanced Directives 04/11/2016  Does patient have an advance directive? Yes  Type of Advance Directive Out of facility DNR (pink MOST or yellow form);Healthcare Power of Attorney  Does patient want to make changes to advanced directive? -  Copy of advanced directive(s) in chart? Yes  Pre-existing out of facility DNR order (yellow form or pink MOST form) Yellow form placed in chart (order not valid for inpatient use);Pink MOST form placed in chart (order not valid for inpatient use)     Chief Complaint  Patient presents with  . Medical Management of Chronic Issues    HPI:  82 y.o.  residing at [facility] skilled section, here for evaluation of chronic medical issues.   Treated for left ear drainage with Augmenting on 10/7. Complete course but continues with left purulent ear drainage. He has recurrent otitis and was referred due to ENT but no new recommendations were suggested. No fever. Cough improved with Augmentin.  CXR 10/8 neg for pna  A1C 8.1% in august , currently on tradjenta 5 mg qd CBGs range 131-179  Has significant dysphagia due to dementia and has a chronic cough. Currently on puree diet with NTL. Uses claritin an duonebs for this as well which seems to  help.  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 No longer able to perform MMSE. Dependent on staff for all ADLs.  Also on Namenda 28 mg XR daily. On depakote for aggressive behaviors which has helped.   Bowls move regularly on miralax  He has a hx of prostate cancer s/p radiationin 2011 but this is no longer followed due to his age, dementia, and debility.  Functional status: Hoyer lift, incontinent   Past Medical History:  Diagnosis Date  . Actinic keratosis   . Alzheimer disease 09/13/2008   Qualifier: Diagnosis of  By: Nils Pyle CMA ([place]), Mearl Latin    . Anemia 2012  . Arthritis   . Cervicalgia 1998   degenereative disease of CS and foraminal narrowing at C5-6  . Coronary atherosclerosis of native coronary artery 2002   60% stenois of the 1st diagonal of the LAD  . Deaf   . Diabetes mellitus 1997  . Epilepsy ([place]) 1943  . Gastric ulcer 2012  . Generalized osteoarthrosis, unspecified site   . Glaucoma   . H/O prostate cancer 2011   treated with radiation  . History of fall 2013  . Hyperlipidemia   . Hypertension 2008  . Macular degeneration (senile) of retina, unspecified   . Major depressive disorder, single episode, unspecified   . Osteoarthritis of knee 10/07/2013   Qualifier: Diagnosis of  By: Nils Pyle CMA (AAMA), Mearl Latin    . Other B-complex deficiencies 2012  . Personal history of colonic polyps 2012   adenomatous  . Reflux  esophagitis 2002  . Type II or unspecified type diabetes mellitus with peripheral circulatory disorders, not stated as uncontrolled(250.70) 05/24/2009   Qualifier: Diagnosis of  By: Caryl Comes, MD, [facility]   . Unspecified venous (peripheral) insufficiency   . Unspecified vitamin D deficiency 2010   Past Surgical History:  Procedure Laterality Date  . APPENDECTOMY  1947  . CARPAL TUNNEL RELEASE Bilateral 2005  . COLONOSCOPY  10/22/10   multiple adenomatous polyps  . MYRINGOTOMY Right   . TONSILLECTOMY  1930     Allergies  Allergen Reactions  . Aspirin   . Nsaids       Medication List       Accurate as of 05/09/16 10:58 AM. Always use your most recent med list.          acetaminophen 325 MG tablet Commonly known as:  TYLENOL Take 650 mg by mouth 3 (three) times daily.   divalproex 125 MG capsule Commonly known as:  DEPAKOTE SPRINKLE Take 125 mg by mouth 2 (two) times daily.   ferrous sulfate 325 (65 FE) MG tablet Take 325 mg by mouth daily with breakfast.   GUAIFEN DM PO Take 15 mLs by mouth 3 (three) times daily.   ipratropium-albuterol 0.5-2.5 (3) MG/3ML Soln Commonly known as:  DUONEB Take 3 mLs by nebulization 2 (two) times daily.   latanoprost 0.005 % ophthalmic solution Commonly known as:  XALATAN Place 1 drop into the right eye at bedtime.   linagliptin 5 MG Tabs tablet Commonly known as:  TRADJENTA Take 5 mg by mouth daily.   loratadine 10 MG tablet Commonly known as:  CLARITIN Take 10 mg by mouth daily.   metoprolol succinate 50 MG 24 hr tablet Commonly known as:  TOPROL-XL Take 50 mg by mouth daily.   NAMENDA XR 28 MG Cp24 24 hr capsule Generic drug:  memantine Take by mouth.   omeprazole 20 MG capsule Commonly known as:  PRILOSEC Take 20 mg by mouth 2 (two) times daily before a meal.   PARoxetine 20 MG tablet Commonly known as:  PAXIL Take 20 mg by mouth daily.   polyethylene glycol packet Commonly known as:  MIRALAX / GLYCOLAX Take 17 g by mouth daily.   VITAMIN B 12 PO Take 1,000 mcg by mouth daily.   Vitamin D 2000 units Caps Take by mouth.       Review of Systems  Unable to perform ROS: Dementia    Immunization History  Administered Date(s) Administered  . Influenza-Unspecified 05/21/2013, 04/26/2014, 05/04/2015  . Pneumococcal Polysaccharide-23 10/11/2009  . Td 10/11/2011  . Zoster 10/17/2010   Pertinent  Health Maintenance Due  Topic Date Due  . FOOT EXAM  08/05/1933  . OPHTHALMOLOGY EXAM  08/05/1933  . URINE  MICROALBUMIN  08/05/1933  . PNA vac Low Risk Adult (2 of 2 - PCV13) 10/12/2010  . COLONOSCOPY  10/21/2013  . INFLUENZA VACCINE  02/20/2016  . HEMOGLOBIN A1C  09/15/2016   Fall Risk  03/16/2015 11/07/2014  Falls in the past year? No No  Risk for fall due to : History of fall(s) Impaired balance/gait;Impaired mobility;Medication side effect;History of fall(s)   Wt Readings from Last 3 Encounters:  05/09/16 202 lb (91.6 kg)  04/11/16 199 lb 12.8 oz (90.6 kg)  03/14/16 198 lb (89.8 kg)    Physical Exam  Constitutional: No distress.  HENT:  Head: Normocephalic and atraumatic.  Right Ear: External ear normal. No drainage, swelling or tenderness. Decreased hearing is noted.  Left Ear:  Ear canal normal. No drainage or swelling. Decreased hearing is noted.  Nose: Nose normal. Right sinus exhibits no maxillary sinus tenderness and no frontal sinus tenderness. Left sinus exhibits no maxillary sinus tenderness and no frontal sinus tenderness.  Mouth/Throat: No oropharyngeal exudate.  Left ear with purulent matter and erythema  Eyes: Conjunctivae are normal. Pupils are equal, round, and reactive to light. Right eye exhibits no discharge. Left eye exhibits no discharge.  Neck: No JVD present.  Cardiovascular: Normal rate and regular rhythm.   No murmur heard. Pulmonary/Chest: Effort normal and breath sounds normal. No respiratory distress.  Abdominal: Soft. Bowel sounds are normal.  Lymphadenopathy:    He has no cervical adenopathy.  Neurological: He is alert.  Oriented to self only  Skin: Skin is warm and dry. He is not diaphoretic.  Psychiatric: He has a normal mood and affect.  Nursing note and vitals reviewed.   Labs reviewed:  Recent Labs  08/31/15 03/15/16  NA 142 142  K 4.8 5.2  BUN 31* 26*  CREATININE 1.7* 1.6*    Recent Labs  03/15/16  AST 18  ALT 13  ALKPHOS 66    Recent Labs  08/31/15 03/15/16  WBC 7.1 10.6  HGB 10.3* 11.1*  HCT 31* 33*  PLT 208 218   Lab  Results  Component Value Date   TSH 2.41 07/27/2014   Lab Results  Component Value Date   HGBA1C 8.1 03/15/2016   Lab Results  Component Value Date   CHOL 150 09/17/2013   HDL 57 09/17/2013   LDLCALC 72 09/17/2013   TRIG 107 09/17/2013   Lab Results  Component Value Date   ALT 13 03/15/2016   AST 18 03/15/2016   ALKPHOS 66 03/15/2016   BILITOT 0.40 01/13/2013  11/30/15: Depakote level 12   Significant Diagnostic Results in last 30 days:  No results found.  Assessment/Plan  1. Recurrent acute suppurative otitis media without spontaneous rupture of left tympanic membrane Cipro otic 3 gtts q 12 hrs x 7 days  2. Slow transit constipation Stable Continue miralax  3. Oropharyngeal dysphagia Continue modified diet and asp prec Continu duonebs BID   4. Type 2 diabetes mellitus with stage 3 chronic kidney disease, without long-term current use of insulin (HCC) Stable conitnue tradjenta 5 mg qd Goal A1C <8%, check q 6 mo  5. Vitamin D deficiency Continue Vit D 2000 units qd  6. Late onset Alzheimer's disease with behavioral disturbance Advanced Continue namenda, depakote, and paxil  7. H/O prostate ca -no further monitoring as above     Cindi Carbon, [place] [phone]

## 2016-06-06 ENCOUNTER — Encounter: Payer: Self-pay | Admitting: Adult Health

## 2016-06-06 ENCOUNTER — Non-Acute Institutional Stay (SKILLED_NURSING_FACILITY): Payer: Medicare Other | Admitting: Adult Health

## 2016-06-06 DIAGNOSIS — I2510 Atherosclerotic heart disease of native coronary artery without angina pectoris: Secondary | ICD-10-CM | POA: Diagnosis not present

## 2016-06-06 DIAGNOSIS — H66005 Acute suppurative otitis media without spontaneous rupture of ear drum, recurrent, left ear: Secondary | ICD-10-CM | POA: Diagnosis not present

## 2016-06-06 DIAGNOSIS — E1122 Type 2 diabetes mellitus with diabetic chronic kidney disease: Secondary | ICD-10-CM

## 2016-06-06 DIAGNOSIS — D500 Iron deficiency anemia secondary to blood loss (chronic): Secondary | ICD-10-CM | POA: Diagnosis not present

## 2016-06-06 DIAGNOSIS — R195 Other fecal abnormalities: Secondary | ICD-10-CM

## 2016-06-06 DIAGNOSIS — H906 Mixed conductive and sensorineural hearing loss, bilateral: Secondary | ICD-10-CM

## 2016-06-06 DIAGNOSIS — H6690 Otitis media, unspecified, unspecified ear: Secondary | ICD-10-CM | POA: Insufficient documentation

## 2016-06-06 DIAGNOSIS — E559 Vitamin D deficiency, unspecified: Secondary | ICD-10-CM

## 2016-06-06 DIAGNOSIS — N183 Chronic kidney disease, stage 3 unspecified: Secondary | ICD-10-CM

## 2016-06-06 NOTE — Progress Notes (Signed)
Patient ID: [name], male   DOB: 03-Oct-1921, 82 y.o.   MRN: [id number]   Location:   [facility]   Place of Service:  SNF (31) Provider:   Cindi Carbon, ANP [facility] [phone]   REED, Jonelle Sidle, DO  Patient Care Team: [name], DO as PCP - General (Geriatric Medicine) Raynelle Bring, MD as Consulting Physician (Urology) Jarome Matin, MD as Consulting Physician (Dermatology)  Extended Emergency Contact Information Primary Emergency Contact: Dressel,[name] Address: [address]          [place], [place] [address] [name] of [place] Phone: [phone] Mobile Phone: [phone] Relation: Son  Code Status:  DNR Goals of care: Advanced Directive information Advanced Directives 06/06/2016  Does patient have an advance directive? Yes  Type of Advance Directive Out of facility DNR (pink MOST or yellow form);Healthcare Power of Attorney  Does patient want to make changes to advanced directive? -  Copy of advanced directive(s) in chart? Yes  Pre-existing out of facility DNR order (yellow form or pink MOST form) Yellow form placed in chart (order not valid for inpatient use);Pink MOST form placed in chart (order not valid for inpatient use)     Chief Complaint  Patient presents with  . Medical Management of Chronic Issues    HPI:  82 y.o. male residing at [facility] skilled section, here for evaluation of chronic medical issues.   Treated for left ear drainage with Cipro one month ago with resolution, now has low grade temp 99.8, also with left ear drainage present for 1 day.  He was evaluated by ENT for recurrent otitis with perforation but no new orders were obtained.   A1C 8.1% in august , currently on tradjenta 5 mg qd CBGs range 130-140  He has a hx of IDA and heme pos stool in 2015. ON Ferrous sulfate 1 tab qd, H/H 11.1/33 in august, no overt bleeding noted  CKD BUN/CR 26/1.6 03/15/16 Cr CL 37.6 ml/min  Remains on  paxil for sexually inappropriate behavior due to dementia. No longer able to perform MMSE. Dependent on staff for all ADLs.  Also on Namenda 28 mg XR daily. On depakote for aggressive behaviors which has helped.   Functional status: Hoyer lift, incontinent   Past Medical History:  Diagnosis Date  . Actinic keratosis   . Alzheimer disease 09/13/2008   Qualifier: Diagnosis of  By: Nils Pyle CMA ([place]), Mearl Latin    . Anemia 2012  . Arthritis   . Cervicalgia 1998   degenereative disease of CS and foraminal narrowing at C5-6  . Coronary atherosclerosis of native coronary artery 2002   60% stenois of the 1st diagonal of the LAD  . Deaf   . Diabetes mellitus 1997  . Epilepsy ([place]) 1943  . Gastric ulcer 2012  . Generalized osteoarthrosis, unspecified site   . Glaucoma   . H/O prostate cancer 2011   treated with radiation  . History of fall 2013  . Hyperlipidemia   . Hypertension 2008  . Macular degeneration (senile) of retina, unspecified   . Major depressive disorder, single episode, unspecified   . Osteoarthritis of knee 10/07/2013   Qualifier: Diagnosis of  By: Nils Pyle CMA (AAMA), Mearl Latin    . Other B-complex deficiencies 2012  . Personal history of colonic polyps 2012   adenomatous  . Reflux esophagitis 2002  . Type II or unspecified type diabetes mellitus with peripheral circulatory disorders, not stated as uncontrolled(250.70) 05/24/2009   Qualifier: Diagnosis of  ByCaryl Comes, MD, [facility], Mack Guise   . Unspecified venous (peripheral) insufficiency   . Unspecified vitamin D deficiency 2010   Past Surgical History:  Procedure Laterality Date  . APPENDECTOMY  1947  . CARPAL TUNNEL RELEASE Bilateral 2005  . COLONOSCOPY  10/22/10   multiple adenomatous polyps  . MYRINGOTOMY Right   . TONSILLECTOMY  1930    Allergies  Allergen Reactions  . Aspirin   . Nsaids       Medication List       Accurate as of 06/06/16 11:48 AM. Always use your most recent med list.            acetaminophen 325 MG tablet Commonly known as:  TYLENOL Take 650 mg by mouth 3 (three) times daily.   divalproex 125 MG capsule Commonly known as:  DEPAKOTE SPRINKLE Take 125 mg by mouth 2 (two) times daily.   ferrous sulfate 325 (65 FE) MG tablet Take 325 mg by mouth daily with breakfast.   ipratropium-albuterol 0.5-2.5 (3) MG/3ML Soln Commonly known as:  DUONEB Take 3 mLs by nebulization 2 (two) times daily.   latanoprost 0.005 % ophthalmic solution Commonly known as:  XALATAN Place 1 drop into the right eye at bedtime.   linagliptin 5 MG Tabs tablet Commonly known as:  TRADJENTA Take 5 mg by mouth daily.   loratadine 10 MG tablet Commonly known as:  CLARITIN Take 10 mg by mouth daily.   metoprolol succinate 50 MG 24 hr tablet Commonly known as:  TOPROL-XL Take 50 mg by mouth daily.   NAMENDA XR 28 MG Cp24 24 hr capsule Generic drug:  memantine Take by mouth.   omeprazole 20 MG capsule Commonly known as:  PRILOSEC Take 20 mg by mouth 2 (two) times daily before a meal.   PARoxetine 20 MG tablet Commonly known as:  PAXIL Take 20 mg by mouth daily.   polyethylene glycol packet Commonly known as:  MIRALAX / GLYCOLAX Take 17 g by mouth daily.   VITAMIN B 12 PO Take 1,000 mcg by mouth daily.   Vitamin D 2000 units Caps Take by mouth.       Review of Systems  Unable to perform ROS: Dementia    Immunization History  Administered Date(s) Administered  . Influenza Inj Mdck Quad Pf 05/09/2016  . Influenza-Unspecified 05/21/2013, 04/26/2014, 05/04/2015  . Pneumococcal Polysaccharide-23 10/11/2009  . Td 10/11/2011  . Zoster 10/17/2010   Pertinent  Health Maintenance Due  Topic Date Due  . FOOT EXAM  08/05/1933  . OPHTHALMOLOGY EXAM  08/05/1933  . URINE MICROALBUMIN  08/05/1933  . PNA vac Low Risk Adult (2 of 2 - PCV13) 10/12/2010  . COLONOSCOPY  10/21/2013  . HEMOGLOBIN A1C  09/15/2016  . INFLUENZA VACCINE  Completed   Fall Risk  03/16/2015  11/07/2014  Falls in the past year? No No  Risk for fall due to : History of fall(s) Impaired balance/gait;Impaired mobility;Medication side effect;History of fall(s)   Wt Readings from Last 3 Encounters:  05/09/16 202 lb (91.6 kg)  04/11/16 199 lb 12.8 oz (90.6 kg)  03/14/16 198 lb (89.8 kg)    Physical Exam  Constitutional: No distress.  HENT:  Head: Normocephalic and atraumatic.  Right Ear: External ear normal. No drainage, swelling or tenderness. Decreased hearing is noted.  Left Ear: Ear canal normal. No drainage or swelling. Decreased hearing is noted.  Nose: Nose normal. Right sinus exhibits no maxillary sinus tenderness and no frontal sinus tenderness. Left sinus exhibits no  maxillary sinus tenderness and no frontal sinus tenderness.  Mouth/Throat: No oropharyngeal exudate.  Left ear with purulent matter and erythema, unable to visualize TM  Eyes: Conjunctivae are normal. Pupils are equal, round, and reactive to light. Right eye exhibits no discharge. Left eye exhibits no discharge.  Neck: No JVD present.  Cardiovascular: Normal rate and regular rhythm.   No murmur heard. Pulmonary/Chest: Effort normal. No respiratory distress.  coarse breath sounds  Abdominal: Soft. Bowel sounds are normal.  Lymphadenopathy:    He has no cervical adenopathy.  Neurological: He is alert.  Oriented to self only  Skin: Skin is warm and dry. He is not diaphoretic.  Psychiatric: He has a normal mood and affect.  Nursing note and vitals reviewed.   Labs reviewed:  Recent Labs  08/31/15 03/15/16  NA 142 142  K 4.8 5.2  BUN 31* 26*  CREATININE 1.7* 1.6*    Recent Labs  03/15/16  AST 18  ALT 13  ALKPHOS 66    Recent Labs  08/31/15 03/15/16  WBC 7.1 10.6  HGB 10.3* 11.1*  HCT 31* 33*  PLT 208 218   Lab Results  Component Value Date   TSH 2.41 07/27/2014   Lab Results  Component Value Date   HGBA1C 8.1 03/15/2016   Lab Results  Component Value Date   CHOL 150 09/17/2013    HDL 57 09/17/2013   LDLCALC 72 09/17/2013   TRIG 107 09/17/2013   Lab Results  Component Value Date   ALT 13 03/15/2016   AST 18 03/15/2016   ALKPHOS 66 03/15/2016   BILITOT 0.40 01/13/2013  11/30/15: Depakote level 12   Significant Diagnostic Results in last 30 days:  No results found.  Assessment/Plan  1. Recurrent acute suppurative otitis media without spontaneous rupture of left tympanic membrane Cipro otic 3 gtts to left ear q 12 for 10 days  2. Atherosclerosis of native coronary artery of native heart without angina pectoris No reports of angina, remains on metoprolol  Not on aspirin due to anemia with hx of heme pos stool  3. Type 2 diabetes mellitus with stage 3 chronic kidney disease, without long-term current use of insulin ([place]) ?? If blood sugars are higher in the evening, lending to increased risk of infection Will order CBGS BID and A1C in am Continue Tradjenta 20m qd, consider reinitiating lantus if A1C above 8%  4. CKD (chronic kidney disease) stage 3, GFR 30-59 ml/min Stable Continue to monitor  5. Heme positive stool Hx of this in 2015, no overt bleeding and stable h/h No work up due to age/debility  6. Iron deficiency anemia due to chronic blood loss Stable Continue to monitor Continue ferrous sulfate 1 tab qd  7. Vitamin D deficiency Continue Vit D 2000 units qd  8. Mixed conductive and sensorineural hearing loss of both ears Noted ,no longer wears assistive device but overall his hearing has improved since I first met him two years ago   CCindi Carbon [phone]

## 2016-06-07 DIAGNOSIS — E119 Type 2 diabetes mellitus without complications: Secondary | ICD-10-CM | POA: Diagnosis not present

## 2016-06-07 LAB — HEMOGLOBIN A1C: Hemoglobin A1C: 7.2

## 2016-06-21 ENCOUNTER — Encounter: Payer: Self-pay | Admitting: Adult Health

## 2016-06-21 ENCOUNTER — Non-Acute Institutional Stay (SKILLED_NURSING_FACILITY): Payer: Medicare Other | Admitting: Adult Health

## 2016-06-21 DIAGNOSIS — R1312 Dysphagia, oropharyngeal phase: Secondary | ICD-10-CM

## 2016-06-21 DIAGNOSIS — R0902 Hypoxemia: Secondary | ICD-10-CM | POA: Diagnosis not present

## 2016-06-21 DIAGNOSIS — H66005 Acute suppurative otitis media without spontaneous rupture of ear drum, recurrent, left ear: Secondary | ICD-10-CM | POA: Diagnosis not present

## 2016-06-21 NOTE — Progress Notes (Signed)
Patient ID: [name], male   DOB: 11-29-1921, 82 y.o.   MRN: [id number]   Location:   [facility]   Place of Service:  SNF (31) Provider:   Cindi Carbon, ANP [facility] [phone]   REED, Dale Sidle, DO  Patient Care Team: Dale Curry, DO as PCP - General (Geriatric Medicine) Dale Bring, MD as Consulting Physician (Urology) Dale Matin, MD as Consulting Physician (Dermatology)  Extended Emergency Contact Information Primary Emergency Contact: [name] Address: [address]          [place], [place] [address] [name] of [place] Phone: [phone] Mobile Phone: [phone] Relation: Son  Code Status:  DNR Goals of care: Advanced Directive information Advanced Directives 06/06/2016  Does Patient Have a Medical Advance Directive? Yes  Type of Advance Directive Out of facility DNR (pink MOST or yellow form);Healthcare Power of Attorney  Does patient want to make changes to medical advance directive? -  Copy of [place] in Chart? Yes  Pre-existing out of facility DNR order (yellow form or pink MOST form) Yellow form placed in chart (order not valid for inpatient use);Pink MOST form placed in chart (order not valid for inpatient use)     Chief Complaint  Patient presents with  . Acute Visit    ear drainage follow up    HPI:  82 y.o. male residing at [facility] skilled section, here to re eval after treatment for otitis on 11/16 with 10 days of cipro gtts.  Staff report that he has not had any reported ear pain but continues with drainage. The drainage has improved but still present.  He has chronic cough and wheezing and takes duonebs BID.  He is a previous smoker but has no formal dx of COPD or PFTs for review.  His coughing has been associated with his dysphagia.  He is on a puree diet with NTL. Previous xrays for the cough have returned negative.  There are findings of emphysema on xray.   He has  recurrent purulent drainage to the left ear. Each time it responds to Cipro but returns.  He has seen ENT with no new orders.  He has a hx of myringotomy and TM perforation. The staff report that it is difficult to keep him still during the administration of the gtts.    Functional status: Hoyer lift, incontinent   Past Medical History:  Diagnosis Date  . Actinic keratosis   . Alzheimer disease 09/13/2008   Qualifier: Diagnosis of  By: [name] ([place]), [name]    . Anemia 2012  . Arthritis   . Cervicalgia 1998   degenereative disease of CS and foraminal narrowing at C5-6  . Coronary atherosclerosis of native coronary artery 2002   60% stenois of the 1st diagonal of the LAD  . Deaf   . Diabetes mellitus 1997  . Epilepsy ([place]) 1943  . Gastric ulcer 2012  . Generalized osteoarthrosis, unspecified site   . Glaucoma   . H/O prostate cancer 2011   treated with radiation  . History of fall 2013  . Hyperlipidemia   . Hypertension 2008  . Macular degeneration (senile) of retina, unspecified   . Major depressive disorder, single episode, unspecified   . Osteoarthritis of knee 10/07/2013   Qualifier: Diagnosis of  By: [name] (AAMA), [name]    . Other B-complex deficiencies 2012  . Personal history of colonic polyps 2012   adenomatous  . Reflux esophagitis 2002  .  Type II or unspecified type diabetes mellitus with peripheral circulatory disorders, not stated as uncontrolled(250.70) 05/24/2009   Qualifier: Diagnosis of  By: Dale Comes, MD, [name]   . Unspecified venous (peripheral) insufficiency   . Unspecified vitamin D deficiency 2010   Past Surgical History:  Procedure Laterality Date  . APPENDECTOMY  1947  . CARPAL TUNNEL RELEASE Bilateral 2005  . COLONOSCOPY  10/22/10   multiple adenomatous polyps  . MYRINGOTOMY Right   . TONSILLECTOMY  1930    Allergies  Allergen Reactions  . Aspirin   . Nsaids       Medication List       Accurate as of 06/21/16 11:55  AM. Always use your most recent med list.          acetaminophen 325 MG tablet Commonly known as:  TYLENOL Take 650 mg by mouth 3 (three) times daily.   divalproex 125 MG capsule Commonly known as:  DEPAKOTE SPRINKLE Take 125 mg by mouth 2 (two) times daily.   ferrous sulfate 325 (65 FE) MG tablet Take 325 mg by mouth daily with breakfast.   ipratropium-albuterol 0.5-2.5 (3) MG/3ML Soln Commonly known as:  DUONEB Take 3 mLs by nebulization 2 (two) times daily.   latanoprost 0.005 % ophthalmic solution Commonly known as:  XALATAN Place 1 drop into the right eye at bedtime.   linagliptin 5 MG Tabs tablet Commonly known as:  TRADJENTA Take 5 mg by mouth daily.   loratadine 10 MG tablet Commonly known as:  CLARITIN Take 10 mg by mouth daily.   metoprolol succinate 50 MG 24 hr tablet Commonly known as:  TOPROL-XL Take 50 mg by mouth daily.   NAMENDA XR 28 MG Cp24 24 hr capsule Generic drug:  memantine Take by mouth.   omeprazole 20 MG capsule Commonly known as:  PRILOSEC Take 20 mg by mouth 2 (two) times daily before a meal.   PARoxetine 20 MG tablet Commonly known as:  PAXIL Take 20 mg by mouth daily.   polyethylene glycol packet Commonly known as:  MIRALAX / GLYCOLAX Take 17 g by mouth daily.   VITAMIN B 12 PO Take 1,000 mcg by mouth daily.   Vitamin D 2000 units Caps Take by mouth.       Review of Systems  Unable to perform ROS: Dementia    Immunization History  Administered Date(s) Administered  . Influenza Inj Mdck Quad Pf 05/09/2016  . Influenza-Unspecified 05/21/2013, 04/26/2014, 05/04/2015  . Pneumococcal Polysaccharide-23 10/11/2009  . Td 10/11/2011  . Zoster 10/17/2010   Pertinent  Health Maintenance Due  Topic Date Due  . FOOT EXAM  08/05/1933  . OPHTHALMOLOGY EXAM  08/05/1931  . URINE MICROALBUMIN  08/05/1931  . PNA vac Low Risk Adult (2 of 2 - PCV13) 10/12/2010  . COLONOSCOPY  10/21/2013  . HEMOGLOBIN A1C  09/15/2016  .  INFLUENZA VACCINE  Completed   Fall Risk  03/16/2015 11/07/2014  Falls in the past year? No No  Risk for fall due to : History of fall(s) Impaired balance/gait;Impaired mobility;Medication side effect;History of fall(s)   Wt Readings from Last 3 Encounters:  05/09/16 202 lb (91.6 kg)  04/11/16 199 lb 12.8 oz (90.6 kg)  03/14/16 198 lb (89.8 kg)    Physical Exam  Constitutional: No distress.  HENT:  Head: Normocephalic and atraumatic.  Right Ear: External ear normal. No drainage, swelling or tenderness. Decreased hearing is noted.  Left Ear: Ear canal normal. No drainage or swelling. Decreased hearing is  noted.  Nose: Nose normal. Right sinus exhibits no maxillary sinus tenderness and no frontal sinus tenderness. Left sinus exhibits no maxillary sinus tenderness and no frontal sinus tenderness.  Mouth/Throat: No oropharyngeal exudate.  Left ear with purulent matter and erythema, unable to visualize TM.  Right ear with purulent drainage and erythema as well. Cerumen and purulent drainage removed but still not able to visualize TM's  Eyes: Conjunctivae are normal. Pupils are equal, round, and reactive to light. Right eye exhibits no discharge. Left eye exhibits no discharge.  Neck: No JVD present.  Cardiovascular: Normal rate and regular rhythm.   No murmur heard. Pulmonary/Chest: Effort normal. No respiratory distress.  Exp wheeze bilat with rhonchi  Abdominal: Soft. Bowel sounds are normal.  Lymphadenopathy:    He has no cervical adenopathy.  Neurological: He is alert.  Oriented to self only  Skin: Skin is warm and dry. He is not diaphoretic.  Psychiatric: He has a normal mood and affect.  Nursing note and vitals reviewed.   Labs reviewed:  Recent Labs  08/31/15 03/15/16  NA 142 142  K 4.8 5.2  BUN 31* 26*  CREATININE 1.7* 1.6*    Recent Labs  03/15/16  AST 18  ALT 13  ALKPHOS 66    Recent Labs  08/31/15 03/15/16  WBC 7.1 10.6  HGB 10.3* 11.1*  HCT 31* 33*  PLT  208 218   Lab Results  Component Value Date   TSH 2.41 07/27/2014   Lab Results  Component Value Date   HGBA1C 8.1 03/15/2016   Lab Results  Component Value Date   CHOL 150 09/17/2013   HDL 57 09/17/2013   LDLCALC 72 09/17/2013   TRIG 107 09/17/2013   Lab Results  Component Value Date   ALT 13 03/15/2016   AST 18 03/15/2016   ALKPHOS 66 03/15/2016   BILITOT 0.40 01/13/2013  11/30/15: Depakote level 12   Significant Diagnostic Results in last 30 days:  No results found.  Assessment/Plan  1. Recurrent acute suppurative otitis media without spontaneous rupture of left tympanic membrane Ciprodex 4 gtts both ears x 10 days, apply cotton balls and wait 5 min after instillation Avoid water and apply petroleum jelly with cotton balls prior to showers If no improvement will need to return to ENT  2. Hypoxia -? Underlying COPD 02 2L for sat<90% Increase duonebs to QID Consider change in breathing treatments if cough and hypoxia continue He appears to be instable condition but his cough is bothersome. His goals of care are comfort based due to his dementia and poor quality of life.  I will not order an xray at this time. He has been xrayed many times, all of which are negative.    3. Dysphagia Continue puree with NTL  Cindi Carbon, [place] [phone]

## 2016-06-24 DIAGNOSIS — R05 Cough: Secondary | ICD-10-CM | POA: Diagnosis not present

## 2016-07-11 ENCOUNTER — Non-Acute Institutional Stay (SKILLED_NURSING_FACILITY): Payer: Medicare Other | Admitting: Adult Health

## 2016-07-11 DIAGNOSIS — J411 Mucopurulent chronic bronchitis: Secondary | ICD-10-CM

## 2016-07-11 DIAGNOSIS — F0281 Dementia in other diseases classified elsewhere with behavioral disturbance: Secondary | ICD-10-CM | POA: Diagnosis not present

## 2016-07-11 DIAGNOSIS — N183 Chronic kidney disease, stage 3 unspecified: Secondary | ICD-10-CM

## 2016-07-11 DIAGNOSIS — I10 Essential (primary) hypertension: Secondary | ICD-10-CM | POA: Diagnosis not present

## 2016-07-11 DIAGNOSIS — G301 Alzheimer's disease with late onset: Secondary | ICD-10-CM

## 2016-07-11 DIAGNOSIS — H66005 Acute suppurative otitis media without spontaneous rupture of ear drum, recurrent, left ear: Secondary | ICD-10-CM

## 2016-07-11 DIAGNOSIS — E1122 Type 2 diabetes mellitus with diabetic chronic kidney disease: Secondary | ICD-10-CM

## 2016-07-11 DIAGNOSIS — J42 Unspecified chronic bronchitis: Secondary | ICD-10-CM | POA: Insufficient documentation

## 2016-07-11 NOTE — Progress Notes (Addendum)
Patient ID: [name], male   DOB: 1922-02-21, 82 y.o.   MRN: [id number]   Location:   [facility]   Place of Service:   SNF Provider:   Cindi Carbon, ANP [facility] [phone]   Hollace Kinnier, DO  Patient Care Team: [name], DO as PCP - General (Geriatric Medicine) Raynelle Bring, MD as Consulting Physician (Urology) Jarome Matin, MD as Consulting Physician (Dermatology)  Extended Emergency Contact Information Primary Emergency Contact: Boffa,[name] Address: [address]          [place], [place] [address] [name] of [place] Phone: [phone] Mobile Phone: [phone] Relation: Son  Code Status:  DNR Goals of care: Advanced Directive information Advanced Directives 06/06/2016  Does Patient Have a Medical Advance Directive? Yes  Type of Advance Directive Out of facility DNR (pink MOST or yellow form);Healthcare Power of Attorney  Does patient want to make changes to medical advance directive? -  Copy of [place] in Chart? Yes  Pre-existing out of facility DNR order (yellow form or pink MOST form) Yellow form placed in chart (order not valid for inpatient use);Pink MOST form placed in chart (order not valid for inpatient use)     Chief Complaint  Patient presents with  . Medical Management of Chronic Issues    HPI:  82 y.o. male residing at [facility] skilled section, medical management of chronic diseases. He has a hx of dementia, DM II, OA, epilepsy, HTN, HLD, GERD, anemia, CKD, recurrent otitis. Treated for bronchitis with hypoxia with Avelox for 5 days on 12/3.  Has hx of recurrent otitis media, started on Ciprodex on 12/1.  Staff denies any fever or drainage.  He has a chronic cough but seems better after the Avelox per staff. He is intermittently oxygen dependent.  Currently he is on 4L of oxygen at 96%.  He has a remove smoking hx. No PFTs for review.  He has dementia with behaviors but  this seems to have improved over time.   DM II on Tradjenta: CBG range 118-145 in am, 139-192 in pm A1C 7.2   Functional status: Hoyer lift, incontinent   Past Medical History:  Diagnosis Date  . Actinic keratosis   . Alzheimer disease 09/13/2008   Qualifier: Diagnosis of  By: Nils Pyle CMA ([place]), Mearl Latin    . Anemia 2012  . Arthritis   . Cervicalgia 1998   degenereative disease of CS and foraminal narrowing at C5-6  . Coronary atherosclerosis of native coronary artery 2002   60% stenois of the 1st diagonal of the LAD  . Deaf   . Diabetes mellitus 1997  . Epilepsy ([place]) 1943  . Gastric ulcer 2012  . Generalized osteoarthrosis, unspecified site   . Glaucoma   . H/O prostate cancer 2011   treated with radiation  . History of fall 2013  . Hyperlipidemia   . Hypertension 2008  . Macular degeneration (senile) of retina, unspecified   . Major depressive disorder, single episode, unspecified   . Osteoarthritis of knee 10/07/2013   Qualifier: Diagnosis of  By: Nils Pyle CMA (AAMA), Mearl Latin    . Other B-complex deficiencies 2012  . Personal history of colonic polyps 2012   adenomatous  . Reflux esophagitis 2002  . Type II or unspecified type diabetes mellitus with peripheral circulatory disorders, not stated as uncontrolled(250.70) 05/24/2009   Qualifier: Diagnosis of  By: Caryl Comes, MD, Remus Blake   . Unspecified venous (peripheral) insufficiency   .  Unspecified vitamin D deficiency 2010   Past Surgical History:  Procedure Laterality Date  . APPENDECTOMY  1947  . CARPAL TUNNEL RELEASE Bilateral 2005  . COLONOSCOPY  10/22/10   multiple adenomatous polyps  . MYRINGOTOMY Right   . TONSILLECTOMY  1930    Allergies  Allergen Reactions  . Aspirin   . Nsaids     Allergies as of 07/11/2016      Reactions   Aspirin    Nsaids       Medication List       Accurate as of 07/11/16 11:14 AM. Always use your most recent med list.          acetaminophen 325 MG  tablet Commonly known as:  TYLENOL Take 650 mg by mouth 3 (three) times daily.   divalproex 125 MG capsule Commonly known as:  DEPAKOTE SPRINKLE Take 125 mg by mouth 2 (two) times daily.   ferrous sulfate 325 (65 FE) MG tablet Take 325 mg by mouth daily with breakfast.   ipratropium-albuterol 0.5-2.5 (3) MG/3ML Soln Commonly known as:  DUONEB Take 3 mLs by nebulization 2 (two) times daily.   latanoprost 0.005 % ophthalmic solution Commonly known as:  XALATAN Place 1 drop into the right eye at bedtime.   linagliptin 5 MG Tabs tablet Commonly known as:  TRADJENTA Take 5 mg by mouth daily.   loratadine 10 MG tablet Commonly known as:  CLARITIN Take 10 mg by mouth daily.   metoprolol succinate 50 MG 24 hr tablet Commonly known as:  TOPROL-XL Take 50 mg by mouth daily.   NAMENDA XR 28 MG Cp24 24 hr capsule Generic drug:  memantine Take by mouth.   omeprazole 20 MG capsule Commonly known as:  PRILOSEC Take 20 mg by mouth 2 (two) times daily before a meal.   PARoxetine 20 MG tablet Commonly known as:  PAXIL Take 20 mg by mouth daily.   polyethylene glycol packet Commonly known as:  MIRALAX / GLYCOLAX Take 17 g by mouth daily.   VITAMIN B 12 PO Take 1,000 mcg by mouth daily.   Vitamin D 2000 units Caps Take by mouth.       Review of Systems  Unable to perform ROS: Dementia    Immunization History  Administered Date(s) Administered  . Influenza Inj Mdck Quad Pf 05/09/2016  . Influenza-Unspecified 05/21/2013, 04/26/2014, 05/04/2015  . Pneumococcal Polysaccharide-23 10/11/2009  . Td 10/11/2011  . Zoster 10/17/2010   Pertinent  Health Maintenance Due  Topic Date Due  . FOOT EXAM  08/05/1931  . OPHTHALMOLOGY EXAM  08/05/1931  . URINE MICROALBUMIN  08/05/1931  . PNA vac Low Risk Adult (2 of 2 - PCV13) 10/12/2010  . COLONOSCOPY  10/21/2013  . HEMOGLOBIN A1C  09/15/2016  . INFLUENZA VACCINE  Completed   Fall Risk  03/16/2015 11/07/2014  Falls in the past  year? No No  Risk for fall due to : History of fall(s) Impaired balance/gait;Impaired mobility;Medication side effect;History of fall(s)   Wt Readings from Last 3 Encounters:  05/09/16 202 lb (91.6 kg)  04/11/16 199 lb 12.8 oz (90.6 kg)  03/14/16 198 lb (89.8 kg)    Physical Exam  Constitutional: No distress.  HENT:  Head: Normocephalic and atraumatic.  Right Ear: External ear normal. No drainage, swelling or tenderness. Decreased hearing is noted.  Left Ear: Ear canal normal. No drainage or swelling. Decreased hearing is noted.  Nose: Nose normal. Right sinus exhibits no maxillary sinus tenderness and no frontal sinus tenderness.  Left sinus exhibits no maxillary sinus tenderness and no frontal sinus tenderness.  Left TM with small amt of purulent matter  Eyes: Conjunctivae are normal. Pupils are equal, round, and reactive to light. Right eye exhibits no discharge. Left eye exhibits no discharge.  Neck: No JVD present.  Cardiovascular: Normal rate and regular rhythm.   No murmur heard. Pulmonary/Chest: Effort normal. No respiratory distress.  Mild exp wheeze bilat  Abdominal: Soft. Bowel sounds are normal.  Lymphadenopathy:    He has no cervical adenopathy.  Neurological: He is alert.  Oriented to self only  Skin: Skin is warm and dry. He is not diaphoretic.  Psychiatric: He has a normal mood and affect.  Nursing note and vitals reviewed.   Labs reviewed:  Recent Labs  08/31/15 03/15/16  NA 142 142  K 4.8 5.2  BUN 31* 26*  CREATININE 1.7* 1.6*    Recent Labs  03/15/16  AST 18  ALT 13  ALKPHOS 66    Recent Labs  08/31/15 03/15/16  WBC 7.1 10.6  HGB 10.3* 11.1*  HCT 31* 33*  PLT 208 218   Lab Results  Component Value Date   TSH 2.41 07/27/2014   Lab Results  Component Value Date   HGBA1C 7.2 06/07/2016   Lab Results  Component Value Date   CHOL 150 09/17/2013   HDL 57 09/17/2013   LDLCALC 72 09/17/2013   TRIG 107 09/17/2013   Lab Results   Component Value Date   ALT 13 03/15/2016   AST 18 03/15/2016   ALKPHOS 66 03/15/2016   BILITOT 0.40 01/13/2013  11/30/15: Depakote level 12   Significant Diagnostic Results in last 30 days:  No results found.  Assessment/Plan  1. Mucopurulent chronic bronchitis (HCC) Improved Continue Duonebs BID Titrate oxygen to keep sats 90% or >  2. Essential hypertension Controlled Continue Toprol XL 50 mg qd  3. Type 2 diabetes mellitus with stage 3 chronic kidney disease, without long-term current use of insulin (HCC) Controlled Continue Tradjenta 5 mg qd  4. Late onset Alzheimer's disease with behavioral disturbance Advanced  Continue supportive care Continue namenda 28 mg XR  5. Recurrent acute suppurative otitis media without spontaneous rupture of left tympanic membrane Resolving  6. CKD (chronic kidney disease) stage 3, GFR 30-59 ml/min Continue to monitor and avoid nephrotoxic BMP q 6 mo  Cindi Carbon, [place] [phone]

## 2016-07-25 ENCOUNTER — Non-Acute Institutional Stay (SKILLED_NURSING_FACILITY): Payer: Medicare Other | Admitting: Adult Health

## 2016-07-25 ENCOUNTER — Encounter: Payer: Self-pay | Admitting: Adult Health

## 2016-07-25 DIAGNOSIS — N481 Balanitis: Secondary | ICD-10-CM

## 2016-07-25 NOTE — Progress Notes (Signed)
Location:  Occupational psychologist of Service:  SNF (31) Provider:   Cindi Carbon, ANP [place] [phone]   REED, Jonelle Sidle, DO  Patient Care Team: [name], DO as PCP - General (Geriatric Medicine) Raynelle Bring, MD as Consulting Physician (Urology) Jarome Matin, MD as Consulting Physician (Dermatology)  Extended Emergency Contact Information Primary Emergency Contact: Turek,[name] Address: [address]          [place], [place] [address] [name] of [place] Phone: [phone] Mobile Phone: [phone] Relation: Son  Code Status:  DNR Goals of care: Advanced Directive information Advanced Directives 06/06/2016  Does Patient Have a Medical Advance Directive? Yes  Type of Advance Directive Out of facility DNR (pink MOST or yellow form);Healthcare Power of Attorney  Does patient want to make changes to medical advance directive? -  Copy of [place] in Chart? Yes  Pre-existing out of facility DNR order (yellow form or pink MOST form) Yellow form placed in chart (order not valid for inpatient use);Pink MOST form placed in chart (order not valid for inpatient use)     Chief Complaint  Patient presents with  . Acute Visit    penile irritation    HPI:  Pt is a 83 y.o. male seen today for an acute visit for penile irritation around the head/shaft of the penis with redness and no drainage.  Present for 3-4 days. Has been a problem before and responded to nystatin.   Past Medical History:  Diagnosis Date  . Actinic keratosis   . Alzheimer disease 09/13/2008   Qualifier: Diagnosis of  By: Nils Pyle CMA ([place]), Mearl Latin    . Anemia 2012  . Arthritis   . Cervicalgia 1998   degenereative disease of CS and foraminal narrowing at C5-6  . Coronary atherosclerosis of native coronary artery 2002   60% stenois of the 1st diagonal of the LAD  . Deaf   . Diabetes mellitus 1997  . Epilepsy ([place]) 1943  . Gastric ulcer  2012  . Generalized osteoarthrosis, unspecified site   . Glaucoma   . H/O prostate cancer 2011   treated with radiation  . History of fall 2013  . Hyperlipidemia   . Hypertension 2008  . Macular degeneration (senile) of retina, unspecified   . Major depressive disorder, single episode, unspecified   . Osteoarthritis of knee 10/07/2013   Qualifier: Diagnosis of  By: Nils Pyle CMA (AAMA), Mearl Latin    . Other B-complex deficiencies 2012  . Personal history of colonic polyps 2012   adenomatous  . Reflux esophagitis 2002  . Type II or unspecified type diabetes mellitus with peripheral circulatory disorders, not stated as uncontrolled(250.70) 05/24/2009   Qualifier: Diagnosis of  By: Caryl Comes, MD, Remus Blake   . Unspecified venous (peripheral) insufficiency   . Unspecified vitamin D deficiency 2010   Past Surgical History:  Procedure Laterality Date  . APPENDECTOMY  1947  . CARPAL TUNNEL RELEASE Bilateral 2005  . COLONOSCOPY  10/22/10   multiple adenomatous polyps  . MYRINGOTOMY Right   . TONSILLECTOMY  1930    Allergies  Allergen Reactions  . Aspirin   . Nsaids     Allergies as of 07/25/2016      Reactions   Aspirin    Nsaids       Medication List       Accurate as of 07/25/16 10:33 AM. Always use your most recent med list.  acetaminophen 325 MG tablet Commonly known as:  TYLENOL Take 650 mg by mouth 3 (three) times daily.   divalproex 125 MG capsule Commonly known as:  DEPAKOTE SPRINKLE Take 125 mg by mouth 2 (two) times daily.   ferrous sulfate 325 (65 FE) MG tablet Take 325 mg by mouth daily with breakfast.   ipratropium-albuterol 0.5-2.5 (3) MG/3ML Soln Commonly known as:  DUONEB Take 3 mLs by nebulization 2 (two) times daily.   latanoprost 0.005 % ophthalmic solution Commonly known as:  XALATAN Place 1 drop into the right eye at bedtime.   linagliptin 5 MG Tabs tablet Commonly known as:  TRADJENTA Take 5 mg by mouth daily.   loratadine 10 MG  tablet Commonly known as:  CLARITIN Take 10 mg by mouth daily.   metoprolol succinate 50 MG 24 hr tablet Commonly known as:  TOPROL-XL Take 50 mg by mouth daily.   NAMENDA XR 28 MG Cp24 24 hr capsule Generic drug:  memantine Take by mouth.   omeprazole 20 MG capsule Commonly known as:  PRILOSEC Take 20 mg by mouth 2 (two) times daily before a meal.   PARoxetine 20 MG tablet Commonly known as:  PAXIL Take 20 mg by mouth daily.   polyethylene glycol packet Commonly known as:  MIRALAX / GLYCOLAX Take 17 g by mouth daily.   VITAMIN B 12 PO Take 1,000 mcg by mouth daily.   Vitamin D 2000 units Caps Take by mouth.       Review of Systems  Genitourinary: Negative for difficulty urinating, discharge, dysuria, penile pain, penile swelling, scrotal swelling and testicular pain.  Skin: Positive for rash.    Immunization History  Administered Date(s) Administered  . Influenza Inj Mdck Quad Pf 05/09/2016  . Influenza-Unspecified 05/21/2013, 04/26/2014, 05/04/2015  . Pneumococcal Polysaccharide-23 10/11/2009  . Td 10/11/2011  . Zoster 10/17/2010   Pertinent  Health Maintenance Due  Topic Date Due  . FOOT EXAM  08/05/1931  . OPHTHALMOLOGY EXAM  08/05/1931  . URINE MICROALBUMIN  08/05/1931  . PNA vac Low Risk Adult (2 of 2 - PCV13) 10/12/2010  . COLONOSCOPY  10/21/2013  . HEMOGLOBIN A1C  12/05/2016  . INFLUENZA VACCINE  Completed   Fall Risk  03/16/2015 11/07/2014  Falls in the past year? No No  Risk for fall due to : History of fall(s) Impaired balance/gait;Impaired mobility;Medication side effect;History of fall(s)   Functional Status Survey:    Vitals:   07/25/16 1031  BP: (!) 145/81  Pulse: 85  Resp: 20  Temp: 98 F (36.7 C)  SpO2: 91%   There is no height or weight on file to calculate BMI. Physical Exam  Constitutional: No distress.  Skin: Skin is warm and dry. Rash (uncircumcised penis with erythema to penis/shaft, no drainage swelling or tenderness)  noted. He is not diaphoretic.    Labs reviewed:  Recent Labs  08/31/15 03/15/16  NA 142 142  K 4.8 5.2  BUN 31* 26*  CREATININE 1.7* 1.6*    Recent Labs  03/15/16  AST 18  ALT 13  ALKPHOS 66    Recent Labs  08/31/15 03/15/16  WBC 7.1 10.6  HGB 10.3* 11.1*  HCT 31* 33*  PLT 208 218   Lab Results  Component Value Date   TSH 2.41 07/27/2014   Lab Results  Component Value Date   HGBA1C 7.2 06/07/2016   Lab Results  Component Value Date   CHOL 150 09/17/2013   HDL 57 09/17/2013   LDLCALC 72  09/17/2013   TRIG 107 09/17/2013    Significant Diagnostic Results in last 30 days:  No results found.  Assessment/Plan  1. Balanitis Nystatin cream BID to penis; retract foreksin for application and then replace for 10 days Then use nystatin powder qd   Family/ staff Communication: discussed with staff  Labs/tests ordered:  NA

## 2016-08-13 ENCOUNTER — Encounter: Payer: Self-pay | Admitting: Internal Medicine

## 2016-08-13 ENCOUNTER — Non-Acute Institutional Stay (SKILLED_NURSING_FACILITY): Payer: Medicare Other | Admitting: Internal Medicine

## 2016-08-13 DIAGNOSIS — E1122 Type 2 diabetes mellitus with diabetic chronic kidney disease: Secondary | ICD-10-CM | POA: Diagnosis not present

## 2016-08-13 DIAGNOSIS — F0281 Dementia in other diseases classified elsewhere with behavioral disturbance: Secondary | ICD-10-CM

## 2016-08-13 DIAGNOSIS — G301 Alzheimer's disease with late onset: Secondary | ICD-10-CM

## 2016-08-13 DIAGNOSIS — J411 Mucopurulent chronic bronchitis: Secondary | ICD-10-CM

## 2016-08-13 DIAGNOSIS — R1312 Dysphagia, oropharyngeal phase: Secondary | ICD-10-CM | POA: Diagnosis not present

## 2016-08-13 DIAGNOSIS — D500 Iron deficiency anemia secondary to blood loss (chronic): Secondary | ICD-10-CM | POA: Diagnosis not present

## 2016-08-13 DIAGNOSIS — I10 Essential (primary) hypertension: Secondary | ICD-10-CM

## 2016-08-13 DIAGNOSIS — N183 Chronic kidney disease, stage 3 unspecified: Secondary | ICD-10-CM

## 2016-08-13 DIAGNOSIS — I2510 Atherosclerotic heart disease of native coronary artery without angina pectoris: Secondary | ICD-10-CM

## 2016-08-13 DIAGNOSIS — F02818 Dementia in other diseases classified elsewhere, unspecified severity, with other behavioral disturbance: Secondary | ICD-10-CM

## 2016-08-13 NOTE — Progress Notes (Addendum)
Patient ID: [name], male   DOB: [birth year], 83 y.o.   MRN: [id number]  Location:  [facility] Room Number: 127 Place of Service:  SNF (31) Provider:  Giovanne Nickolson L. Mariea Clonts, D.O., C.M.D.  Hollace Kinnier, DO  Patient Care Team: [name], DO as PCP - General (Geriatric Medicine) Raynelle Bring, MD as Consulting Physician (Urology) Jarome Matin, MD as Consulting Physician (Dermatology)  Extended Emergency Contact Information Primary Emergency Contact: Twyman,[name] Address: [address]          [place], [place] [address] [name] of [place] Phone: [phone] Mobile Phone: [phone] Relation: Son  Code Status:  DNR Goals of care: Advanced Directive information Advanced Directives 08/13/2016  Does Patient Have a Medical Advance Directive? Yes  Type of Advance Directive Out of facility DNR (pink MOST or yellow form);[place];Living will  Does patient want to make changes to medical advance directive? -  Copy of [place] in Chart? Yes  Pre-existing out of facility DNR order (yellow form or pink MOST form) Yellow form placed in chart (order not valid for inpatient use);Pink MOST form placed in chart (order not valid for inpatient use)  DNR. Per MOST form- DNR, Limited additional intervention, Determine use or limitation of ABT when infection occurs, and IV fluids for a defined trial period.  Chief Complaint  Patient presents with  . Medical Management of Chronic Issues    routine visit    HPI:  Pt is a 83 y.o. male seen today for medical management of chronic diseases.    AD:  Behaviors improved more recently.  No inappropriate gestures.  Requires hoyer lift for transfers, incontinent of bowel and bladder.  Spends much of day in the hall reading near a window.  Remains on depakote and paxil for mood stabilization.  CAD:  No chest pain or sob at this time.  Continues on toprol xl only.  Off  aspirin with his anemia.    DMII with CKD3:   Lab Results  Component Value Date   HGBA1C 7.2 06/07/2016  on tradjenta.  No lows noted on bid CBGs.   HTN: stable on toprol xl only  Iron deficiency anemia:  Remains on daily iron with breakfast.  Lab Results  Component Value Date   WBC 10.6 03/15/2016   HGB 11.1 (A) 03/15/2016   HCT 33 (A) 03/15/2016   MCV 94.6 01/13/2013   PLT 218 03/15/2016    Chr bronchitis:  Has chronic cough, uses O2 at times.  Prior smoker.  No PFTs.  Dysphagia: Pureed diet with Nectar thickened liquids, aspiration precautions.  No noted recent aspiration episodes.     Past Medical History:  Diagnosis Date  . Actinic keratosis   . Alzheimer disease 09/13/2008   Qualifier: Diagnosis of  By: Nils Pyle CMA ([place]), Mearl Latin    . Anemia 2012  . Arthritis   . Cervicalgia 1998   degenereative disease of CS and foraminal narrowing at C5-6  . Coronary atherosclerosis of native coronary artery 2002   60% stenois of the 1st diagonal of the LAD  . Deaf   . Diabetes mellitus 1997  . Epilepsy ([place]) 1943  . Gastric ulcer 2012  . Generalized osteoarthrosis, unspecified site   . Glaucoma   . H/O prostate cancer 2011   treated with radiation  . History of fall 2013  . Hyperlipidemia   . Hypertension 2008  . Macular degeneration (senile) of retina, unspecified   .  Major depressive disorder, single episode, unspecified   . Osteoarthritis of knee 10/07/2013   Qualifier: Diagnosis of  By: Nils Pyle CMA (AAMA), Mearl Latin    . Other B-complex deficiencies 2012  . Personal history of colonic polyps 2012   adenomatous  . Reflux esophagitis 2002  . Type II or unspecified type diabetes mellitus with peripheral circulatory disorders, not stated as uncontrolled(250.70) 05/24/2009   Qualifier: Diagnosis of  By: Caryl Comes, MD, Remus Blake   . Unspecified venous (peripheral) insufficiency   . Unspecified vitamin D deficiency 2010   Past Surgical History:  Procedure Laterality  Date  . APPENDECTOMY  1947  . CARPAL TUNNEL RELEASE Bilateral 2005  . COLONOSCOPY  10/22/10   multiple adenomatous polyps  . MYRINGOTOMY Right   . TONSILLECTOMY  1930    Allergies  Allergen Reactions  . Aspirin   . Nsaids     Allergies as of 08/13/2016      Reactions   Aspirin    Nsaids       Medication List       Accurate as of 08/13/16  2:21 PM. Always use your most recent med list.          acetaminophen 325 MG tablet Commonly known as:  TYLENOL Take 650 mg by mouth 3 (three) times daily.   divalproex 125 MG capsule Commonly known as:  DEPAKOTE SPRINKLE Take 125 mg by mouth 2 (two) times daily.   ferrous sulfate 325 (65 FE) MG tablet Take 325 mg by mouth daily with breakfast.   ipratropium-albuterol 0.5-2.5 (3) MG/3ML Soln Commonly known as:  DUONEB Take 3 mLs by nebulization 2 (two) times daily.   latanoprost 0.005 % ophthalmic solution Commonly known as:  XALATAN Place 1 drop into the right eye at bedtime.   linagliptin 5 MG Tabs tablet Commonly known as:  TRADJENTA Take 5 mg by mouth daily.   loratadine 10 MG tablet Commonly known as:  CLARITIN Take 10 mg by mouth daily.   metoprolol succinate 50 MG 24 hr tablet Commonly known as:  TOPROL-XL Take 50 mg by mouth daily.   NAMENDA XR 28 MG Cp24 24 hr capsule Generic drug:  memantine Take by mouth.   omeprazole 20 MG capsule Commonly known as:  PRILOSEC Take 20 mg by mouth 2 (two) times daily before a meal.   PARoxetine 20 MG tablet Commonly known as:  PAXIL Take 20 mg by mouth daily.   polyethylene glycol packet Commonly known as:  MIRALAX / GLYCOLAX Take 17 g by mouth daily.   VITAMIN B 12 PO Take 1,000 mcg by mouth daily.   Vitamin D 2000 units Caps Take by mouth.       Review of Systems  Constitutional: Negative for activity change, appetite change, chills and fever.  HENT: Positive for hearing loss.   Eyes: Positive for visual disturbance.       Glaucoma  Respiratory:  Negative for chest tightness, shortness of breath and wheezing.   Cardiovascular: Negative for chest pain and leg swelling.  Gastrointestinal: Positive for constipation. Negative for abdominal distention, abdominal pain, blood in stool, diarrhea, nausea and vomiting.       Constipation controlled  Genitourinary: Negative for dysuria.       Incontinent  Musculoskeletal: Positive for gait problem. Negative for arthralgias.  Skin: Positive for pallor. Negative for color change.  Neurological: Positive for weakness. Negative for dizziness and numbness.  Psychiatric/Behavioral: Positive for behavioral problems and confusion. Negative for sleep disturbance and suicidal ideas.  Behavior improved    Immunization History  Administered Date(s) Administered  . Influenza Inj Mdck Quad Pf 05/09/2016  . Influenza-Unspecified 05/21/2013, 04/26/2014, 05/04/2015  . Pneumococcal Polysaccharide-23 10/11/2009  . Td 10/11/2011  . Zoster 10/17/2010   Pertinent  Health Maintenance Due  Topic Date Due  . FOOT EXAM  08/05/1931  . OPHTHALMOLOGY EXAM  08/05/1931  . URINE MICROALBUMIN  08/05/1931  . PNA vac Low Risk Adult (2 of 2 - PCV13) 10/12/2010  . COLONOSCOPY  10/21/2013  . HEMOGLOBIN A1C  12/05/2016  . INFLUENZA VACCINE  Completed   Fall Risk  03/16/2015 11/07/2014  Falls in the past year? No No  Risk for fall due to : History of fall(s) Impaired balance/gait;Impaired mobility;Medication side effect;History of fall(s)   Functional Status Survey:    Vitals:   08/13/16 1409  BP: 110/67  Pulse: (!) 59  Resp: 17  Temp: 97.6 F (36.4 C)  TempSrc: Oral  SpO2: 91%  Weight: 198 lb (89.8 kg)   Body mass index is 32.45 kg/m. Physical Exam  Constitutional: He appears well-developed. No distress.  HENT:  HOH  Eyes:  glasses  Cardiovascular: Normal rate, regular rhythm, normal heart sounds and intact distal pulses.   Mild edema, wearing compression hose  Pulmonary/Chest: Effort normal and  breath sounds normal. No respiratory distress. He has no wheezes.  Abdominal: Soft. Bowel sounds are normal. He exhibits no distension. There is no tenderness.  Musculoskeletal: Normal range of motion.  Neurological: He is alert.  Seated in wheelchair in hallway reading   Skin: Skin is warm. There is pallor.  Psychiatric: He has a normal mood and affect.    Labs reviewed:  Recent Labs  08/31/15 03/15/16  NA 142 142  K 4.8 5.2  BUN 31* 26*  CREATININE 1.7* 1.6*    Recent Labs  03/15/16  AST 18  ALT 13  ALKPHOS 66    Recent Labs  08/31/15 03/15/16  WBC 7.1 10.6  HGB 10.3* 11.1*  HCT 31* 33*  PLT 208 218   Lab Results  Component Value Date   TSH 2.41 07/27/2014   Lab Results  Component Value Date   HGBA1C 7.2 06/07/2016   Lab Results  Component Value Date   CHOL 150 09/17/2013   HDL 57 09/17/2013   LDLCALC 72 09/17/2013   TRIG 107 09/17/2013    Assessment/Plan 1. Late onset Alzheimer's disease with behavioral disturbance -remains on namenda XR which may be helping behavior so hesitant to d/c, continues to "read" but requires assistance with ADLs and uses hoyer for transfers  2. Type 2 diabetes mellitus with stage 3 chronic kidney disease, without long-term current use of insulin (HCC) -control satisfactory for age of 57 -cont tradjenta only -would consider reducing cbgs to weekly as long as intake consistent -needs foot exam documented and eye exam (if still able to participate in these and getting them here?)  3. Essential hypertension -bp well controlled on toprol, no changes, monitor  4. Atherosclerosis of native coronary artery of native heart without angina pectoris -only on beta blocker due to anemia/blood loss, age, dementia  5. Iron deficiency anemia due to chronic blood loss -cont daily iron, last hgb in 11 range -note that patient was due for repeat cscope due to h/o adenomatous polyps in 2015, but no longer a good candidate for this invasive  procedure at 89 with dementia dependent in adls--was removed from health maintenance needs  6. Mucopurulent chronic bronchitis ([place]) On duonebs bid only (not  able to use inhalers at this stage of dementia)  7. Oropharyngeal dysphagia -cont modified diet, aspiration precautions  Family/ staff Communication: discussed with snf nurse and manager  Labs/tests ordered:  Requires monitoring of hgb, cr, hba1c and depakote levels at least 2x per year--looks like labs have been [place] and august

## 2016-09-12 ENCOUNTER — Encounter: Payer: Self-pay | Admitting: Adult Health

## 2016-09-12 ENCOUNTER — Non-Acute Institutional Stay (SKILLED_NURSING_FACILITY): Payer: Medicare Other | Admitting: Adult Health

## 2016-09-12 DIAGNOSIS — D500 Iron deficiency anemia secondary to blood loss (chronic): Secondary | ICD-10-CM

## 2016-09-12 DIAGNOSIS — Z23 Encounter for immunization: Secondary | ICD-10-CM | POA: Diagnosis not present

## 2016-09-12 DIAGNOSIS — E1122 Type 2 diabetes mellitus with diabetic chronic kidney disease: Secondary | ICD-10-CM

## 2016-09-12 DIAGNOSIS — H01001 Unspecified blepharitis right upper eyelid: Secondary | ICD-10-CM

## 2016-09-12 DIAGNOSIS — I10 Essential (primary) hypertension: Secondary | ICD-10-CM | POA: Diagnosis not present

## 2016-09-12 DIAGNOSIS — R1312 Dysphagia, oropharyngeal phase: Secondary | ICD-10-CM | POA: Diagnosis not present

## 2016-09-12 DIAGNOSIS — H01004 Unspecified blepharitis left upper eyelid: Secondary | ICD-10-CM | POA: Diagnosis not present

## 2016-09-12 DIAGNOSIS — N183 Chronic kidney disease, stage 3 unspecified: Secondary | ICD-10-CM

## 2016-09-12 DIAGNOSIS — H0100B Unspecified blepharitis left eye, upper and lower eyelids: Principal | ICD-10-CM

## 2016-09-12 DIAGNOSIS — H01002 Unspecified blepharitis right lower eyelid: Secondary | ICD-10-CM | POA: Diagnosis not present

## 2016-09-12 DIAGNOSIS — H0100A Unspecified blepharitis right eye, upper and lower eyelids: Secondary | ICD-10-CM

## 2016-09-12 DIAGNOSIS — H01005 Unspecified blepharitis left lower eyelid: Secondary | ICD-10-CM

## 2016-09-12 NOTE — Progress Notes (Signed)
Patient ID: [name], male   DOB: 05-Sep-1921, 83 y.o.   MRN: [id number]   Location:   [facility]   Place of Service:  SNF (31) Provider:   Cindi Carbon, ANP [facility] [phone]   REED, Jonelle Sidle, DO  Patient Care Team: [name], DO as PCP - General (Geriatric Medicine) Raynelle Bring, MD as Consulting Physician (Urology) Jarome Matin, MD as Consulting Physician (Dermatology)  Extended Emergency Contact Information Primary Emergency Contact: Niemczyk,[name] Address: [address]          [place], [place] [address] [name] of [place] Phone: [phone] Mobile Phone: [phone] Relation: Son  Code Status:  DNR Goals of care: Advanced Directive information Advanced Directives 08/13/2016  Does Patient Have a Medical Advance Directive? Yes  Type of Advance Directive Out of facility DNR (pink MOST or yellow form);[place];Living will  Does patient want to make changes to medical advance directive? -  Copy of [place] in Chart? Yes  Pre-existing out of facility DNR order (yellow form or pink MOST form) Yellow form placed in chart (order not valid for inpatient use);Pink MOST form placed in chart (order not valid for inpatient use)     Chief Complaint  Patient presents with  . Medical Management of Chronic Issues    HPI:  83 y.o. male residing at [facility] skilled section, medical management of chronic diseases. He has a hx of dementia, DM II, OA, epilepsy, HTN, HLD, GERD, anemia, CKD, recurrent otitis.  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 Actinic keratosis   . Alzheimer disease 09/13/2008   Qualifier: Diagnosis of  By: Nils Pyle CMA ([place]), Mearl Latin    . Anemia 2012  . Arthritis   . Cervicalgia 1998   degenereative disease of CS and foraminal narrowing at C5-6  . Coronary atherosclerosis of native coronary artery 2002   60% stenois of the 1st diagonal of the LAD  . Deaf   . Diabetes mellitus 1997  . Epilepsy ([place]) 1943  . Gastric ulcer 2012  . Generalized osteoarthrosis, unspecified site   . Glaucoma   . H/O prostate cancer 2011   treated with radiation  . History of fall 2013  . Hyperlipidemia   . Hypertension 2008  . Macular degeneration (senile) of retina, unspecified   . Major depressive disorder, single episode, unspecified   . Osteoarthritis of knee 10/07/2013   Qualifier: Diagnosis of  By: Nils Pyle CMA (AAMA), Mearl Latin    . Other B-complex deficiencies 2012  . Personal history of colonic polyps 2012   adenomatous  . Reflux esophagitis 2002  . Type II or unspecified type diabetes mellitus with peripheral circulatory disorders, not stated as uncontrolled(250.70) 05/24/2009   Qualifier: Diagnosis of  By: Caryl Comes, MD, Remus Blake   . Unspecified venous (peripheral) insufficiency   .  Unspecified vitamin D deficiency 2010   Past Surgical History:  Procedure Laterality Date  . APPENDECTOMY  1947  . CARPAL TUNNEL RELEASE Bilateral 2005  . COLONOSCOPY  10/22/10   multiple adenomatous polyps  . MYRINGOTOMY Right   . TONSILLECTOMY  1930    Allergies  Allergen Reactions  . Aspirin   . Nsaids     Allergies as of 09/12/2016      Reactions   Aspirin    Nsaids       Medication List       Accurate as of 09/12/16 11:25 AM. Always use your most recent med list.          acetaminophen  325 MG tablet Commonly known as:  TYLENOL Take 650 mg by mouth 3 (three) times daily.   divalproex 125 MG capsule Commonly known as:  DEPAKOTE SPRINKLE Take 125 mg by mouth 2 (two) times daily.   ferrous sulfate 325 (65 FE) MG tablet Take 325 mg by mouth daily with breakfast.   ipratropium-albuterol 0.5-2.5 (3) MG/3ML Soln Commonly known as:  DUONEB Take 3 mLs by nebulization 2 (two) times daily.   latanoprost 0.005 % ophthalmic solution Commonly known as:  XALATAN Place 1 drop into the right eye at bedtime.   linagliptin 5 MG Tabs tablet Commonly known as:  TRADJENTA Take 5 mg by mouth daily.   loratadine 10 MG tablet Commonly known as:  CLARITIN Take 10 mg by mouth daily.   metoprolol succinate 50 MG 24 hr tablet Commonly known as:  TOPROL-XL Take 50 mg by mouth daily.   NAMENDA XR 28 MG Cp24 24 hr capsule Generic drug:  memantine Take by mouth.   omeprazole 20 MG capsule Commonly known as:  PRILOSEC Take 20 mg by mouth 2 (two) times daily before a meal.   PARoxetine 20 MG tablet Commonly known as:  PAXIL Take 20 mg by mouth daily.   polyethylene glycol packet Commonly known as:  MIRALAX / GLYCOLAX Take 17 g by mouth daily.   VITAMIN B 12 PO Take 1,000 mcg by mouth daily.   Vitamin D 2000 units Caps Take by mouth.       Review of Systems  Unable to perform ROS: Dementia    Immunization History  Administered Date(s) Administered  . Influenza Inj Mdck Quad Pf 05/09/2016  . Influenza-Unspecified 05/21/2013, 04/26/2014, 05/04/2015  . Pneumococcal Polysaccharide-23 10/11/2009  . Td 10/11/2011  . Zoster 10/17/2010   Pertinent  Health Maintenance Due  Topic Date Due  . FOOT EXAM  08[birth year]05/1931  . OPHTHALMOLOGY EXAM  08[birth year]05/1931  . URINE MICROALBUMIN  08[birth year]05/1931  . PNA vac Low Risk Adult (2 of 2 - PCV13) 10/12/2010  . HEMOGLOBIN A1C  12/05/2016  . INFLUENZA VACCINE  Completed   Fall Risk  03/16/2015 11/07/2014  Falls in the past year? No No  Risk for  fall due to : History of fall(s) Impaired balance/gait;Impaired mobility;Medication side effect;History of fall(s)   Wt Readings from Last 3 Encounters:  09/12/16 198 lb 9.6 oz (90.1 kg)  08/13/16 198 lb (89.8 kg)  05/09/16 202 lb (91.6 kg)   Vitals:   09/12/16 1126  BP: (!) 148/85  Pulse: 61  Resp: 20  Temp: 97.6 F (36.4 C)     Physical Exam  Constitutional: No distress.  HENT:  Head: Normocephalic and atraumatic.  Right Ear: External ear normal. No drainage, swelling or tenderness. Decreased hearing is noted.  Left Ear: External ear and ear canal normal. No drainage  or swelling. Decreased hearing is noted.  Nose: Nose normal. Right sinus exhibits no maxillary sinus tenderness and no frontal sinus tenderness. Left sinus exhibits no maxillary sinus tenderness and no frontal sinus tenderness.  Eyes: Conjunctivae are normal. Pupils are equal, round, and reactive to light. Right eye exhibits discharge. Left eye exhibits discharge.  Neck: No JVD present.  Cardiovascular: Normal rate and regular rhythm.   No murmur heard. Pulmonary/Chest: Effort normal. No respiratory distress.  Mild exp wheeze bilat  Abdominal: Soft. Bowel sounds are normal.  Lymphadenopathy:    He has no cervical adenopathy.  Neurological: He is alert.  Oriented to self only  Skin: Skin is warm and dry. He is not diaphoretic.  Psychiatric: He has a normal mood and affect.  Nursing note and vitals reviewed.   Labs reviewed:  Recent Labs  03/15/16  NA 142  K 5.2  BUN 26*  CREATININE 1.6*    Recent Labs  03/15/16  AST 18  ALT 13  ALKPHOS 66    Recent Labs  03/15/16  WBC 10.6  HGB 11.1*  HCT 33*  PLT 218   Lab Results  Component Value Date   TSH 2.41 07/27/2014   Lab Results  Component Value Date   HGBA1C 7.2 06/07/2016   Lab Results  Component Value Date   CHOL 150 09/17/2013   HDL 57 09/17/2013   LDLCALC 72 09/17/2013   TRIG 107 09/17/2013   Lab Results  Component Value Date     ALT 13 03/15/2016   AST 18 03/15/2016   ALKPHOS 66 03/15/2016   BILITOT 0.40 01/13/2013  11/30/15: Depakote level 12   Significant Diagnostic Results in last 30 days:  No results found.  Assessment/Plan  1. Blepharitis of upper and lower eyelids of both eyes, unspecified type Ocusoft scrub to both eyes qd (more compliant with am nurse) Refresh 2 gtts to both eyes BID  2. Type 2 diabetes mellitus with stage 3 chronic kidney disease, without long-term current use of insulin (HCC) Controlled Check A1C Ordered annual eye exam (?if pt will be resistant due to dementia)  3. Iron deficiency anemia due to chronic blood loss Check CBC  4. CKD (chronic kidney disease) stage 3, GFR 30-59 ml/min Check BMP, also needs depakote level  5. Oropharyngeal dysphagia Continue puree diet with NTL, tolerating well  6. Essential hypertension Controlled, goal <150/90 given his age/dementia/debility Ferrous sulfate 325 mg qd  7. Immunization due Prenar ordered  Cindi Carbon, ANP [facility] [phone] Patient ID: [name], male   DOB: 08/25/1921, 83 y.o.   MRN: [id number]

## 2016-09-13 DIAGNOSIS — E119 Type 2 diabetes mellitus without complications: Secondary | ICD-10-CM | POA: Diagnosis not present

## 2016-09-13 DIAGNOSIS — N189 Chronic kidney disease, unspecified: Secondary | ICD-10-CM | POA: Diagnosis not present

## 2016-09-13 DIAGNOSIS — D508 Other iron deficiency anemias: Secondary | ICD-10-CM | POA: Diagnosis not present

## 2016-09-13 LAB — BASIC METABOLIC PANEL
BUN: 25 mg/dL — AB (ref 4–21)
Creatinine: 1.6 mg/dL — AB (ref 0.6–1.3)
Glucose: 138 mg/dL
POTASSIUM: 5.4 mmol/L — AB (ref 3.4–5.3)
Sodium: 144 mmol/L (ref 137–147)

## 2016-09-13 LAB — CBC AND DIFFERENTIAL
HEMATOCRIT: 35 % — AB (ref 41–53)
HEMOGLOBIN: 11.2 g/dL — AB (ref 13.5–17.5)
Platelets: 207 10*3/uL (ref 150–399)
WBC: 7.9 10*3/mL

## 2016-09-13 LAB — HEMOGLOBIN A1C: Hemoglobin A1C: 6.6

## 2016-10-10 ENCOUNTER — Non-Acute Institutional Stay (SKILLED_NURSING_FACILITY): Payer: Medicare Other | Admitting: Adult Health

## 2016-10-10 DIAGNOSIS — J411 Mucopurulent chronic bronchitis: Secondary | ICD-10-CM

## 2016-10-10 DIAGNOSIS — I10 Essential (primary) hypertension: Secondary | ICD-10-CM | POA: Diagnosis not present

## 2016-10-10 DIAGNOSIS — N183 Chronic kidney disease, stage 3 unspecified: Secondary | ICD-10-CM

## 2016-10-10 DIAGNOSIS — G301 Alzheimer's disease with late onset: Secondary | ICD-10-CM

## 2016-10-10 DIAGNOSIS — R1312 Dysphagia, oropharyngeal phase: Secondary | ICD-10-CM | POA: Diagnosis not present

## 2016-10-10 DIAGNOSIS — H01002 Unspecified blepharitis right lower eyelid: Secondary | ICD-10-CM | POA: Diagnosis not present

## 2016-10-10 DIAGNOSIS — H01004 Unspecified blepharitis left upper eyelid: Secondary | ICD-10-CM | POA: Diagnosis not present

## 2016-10-10 DIAGNOSIS — H01001 Unspecified blepharitis right upper eyelid: Secondary | ICD-10-CM

## 2016-10-10 DIAGNOSIS — D500 Iron deficiency anemia secondary to blood loss (chronic): Secondary | ICD-10-CM

## 2016-10-10 DIAGNOSIS — E1122 Type 2 diabetes mellitus with diabetic chronic kidney disease: Secondary | ICD-10-CM | POA: Diagnosis not present

## 2016-10-10 DIAGNOSIS — H0100A Unspecified blepharitis right eye, upper and lower eyelids: Secondary | ICD-10-CM

## 2016-10-10 DIAGNOSIS — F0281 Dementia in other diseases classified elsewhere with behavioral disturbance: Secondary | ICD-10-CM | POA: Diagnosis not present

## 2016-10-10 DIAGNOSIS — H01005 Unspecified blepharitis left lower eyelid: Secondary | ICD-10-CM | POA: Diagnosis not present

## 2016-10-10 DIAGNOSIS — F02818 Dementia in other diseases classified elsewhere, unspecified severity, with other behavioral disturbance: Secondary | ICD-10-CM

## 2016-10-10 DIAGNOSIS — H0100B Unspecified blepharitis left eye, upper and lower eyelids: Secondary | ICD-10-CM

## 2016-10-10 LAB — HM DIABETES FOOT EXAM

## 2016-10-14 ENCOUNTER — Encounter: Payer: Self-pay | Admitting: Adult Health

## 2016-10-14 DIAGNOSIS — H01006 Unspecified blepharitis left eye, unspecified eyelid: Secondary | ICD-10-CM

## 2016-10-14 DIAGNOSIS — H01003 Unspecified blepharitis right eye, unspecified eyelid: Secondary | ICD-10-CM | POA: Insufficient documentation

## 2016-10-14 NOTE — Progress Notes (Signed)
Patient ID: [name], male   DOB: [birth year], 83 y.o.   MRN: [id number]   Location:   [facility]   Place of Service:  SNF (31) Provider:   Cindi Carbon, ANP [facility] [phone]   REED, Jonelle Sidle, DO  Patient Care Team: [name], DO as PCP - General (Geriatric Medicine) Raynelle Bring, MD as Consulting Physician (Urology) Jarome Matin, MD as Consulting Physician (Dermatology)  Extended Emergency Contact Information Primary Emergency Contact: Roa,[name] Address: [address]          [place], [place] [address] [name] of [place] Phone: [phone] Mobile Phone: [phone] Relation: Son  Code Status:  DNR Goals of care: Advanced Directive information Advanced Directives 10/14/2016  Does Patient Have a Medical Advance Directive? Yes  Type of Advance Directive Out of facility DNR (pink MOST or yellow form);[place];Living will  Does patient want to make changes to medical advance directive? -  Copy of [place] in Chart? Yes  Pre-existing out of facility DNR order (yellow form or pink MOST form) Yellow form placed in chart (order not valid for inpatient use);Pink MOST form placed in chart (order not valid for inpatient use)     Chief Complaint  Patient presents with  . Medical Management of Chronic Issues    HPI:  83 y.o. male residing at [facility] skilled section, medical management of chronic diseases. He has a hx of dementia, DM II, OA, epilepsy, HTN, HLD, GERD, anemia, CKD, recurrent otitis.  He was started on ocusoft scrubs and refresh due to chronic blepharitis which has improved with no reports of eye drainage  Chronic bronchitis: previous smoker, hx of aspiration, on duonebs BID scheduled, only occasional non productive cough per staff  DM II on Tradjenta: CBG range 120-190 A1C 7.2  Dysphagia: on puree diet with NTL, tolerating well, has chronic cough due to  chronic bronchitis  BP controlled, currently on Toprol 50 mg qd, not on an ace due to age/dementia  Hx of anemia with heme pos stools, no new reports of this, not worked up due to age/debility, H/H 11.2/35, on ferrous sulfate  Dementia: h/o sexually inappropriate behaviors and agitation currently on depakote and paxil. Occasional inappropriate touches or comments per staff  Functional status: Harrel Lemon lift, incontinent   Past Medical History:  Diagnosis Date  . Actinic keratosis   . Alzheimer disease 09/13/2008   Qualifier: Diagnosis of  By: Nils Pyle CMA ([place]), Mearl Latin    . Anemia 2012  . Arthritis   . Cervicalgia 1998   degenereative disease of CS and foraminal narrowing at C5-6  . Coronary atherosclerosis of native coronary artery 2002   60% stenois of the 1st diagonal of the LAD  . Deaf   . Diabetes mellitus 1997  . Epilepsy ([place]) 1943  . Gastric ulcer 2012  . Generalized osteoarthrosis, unspecified site   . Glaucoma   . H/O prostate cancer 2011   treated with radiation  . History of fall 2013  . Hyperlipidemia   . Hypertension 2008  . Macular degeneration (senile) of retina, unspecified   . Major depressive disorder, single episode, unspecified   . Osteoarthritis of knee 10/07/2013   Qualifier: Diagnosis of  By: Nils Pyle CMA (AAMA), Mearl Latin    . Other B-complex deficiencies 2012  . Personal history of colonic polyps 2012   adenomatous  . Reflux esophagitis 2002  . Type II or unspecified type diabetes mellitus with peripheral circulatory disorders, not  stated as uncontrolled(250.70) 05/24/2009   Qualifier: Diagnosis of  By: Caryl Comes, MD, Remus Blake   . Unspecified venous (peripheral) insufficiency   . Unspecified vitamin D deficiency 2010   Past Surgical History:  Procedure Laterality Date  . APPENDECTOMY  1947  . CARPAL TUNNEL RELEASE Bilateral 2005  . COLONOSCOPY  10/22/10   multiple adenomatous polyps  . MYRINGOTOMY Right   . TONSILLECTOMY  1930    Allergies    Allergen Reactions  . Aspirin   . Nsaids     Allergies as of 10/10/2016      Reactions   Aspirin    Nsaids       Medication List       Accurate as of 10/10/16 11:59 PM. Always use your most recent med list.          acetaminophen 325 MG tablet Commonly known as:  TYLENOL Take 650 mg by mouth 3 (three) times daily.   carboxymethylcellulose 0.5 % Soln Commonly known as:  REFRESH PLUS 2 drops 2 (two) times daily.   divalproex 125 MG capsule Commonly known as:  DEPAKOTE SPRINKLE Take 125 mg by mouth 2 (two) times daily.   ferrous sulfate 325 (65 FE) MG tablet Take 325 mg by mouth daily with breakfast.   ipratropium-albuterol 0.5-2.5 (3) MG/3ML Soln Commonly known as:  DUONEB Take 3 mLs by nebulization 2 (two) times daily.   latanoprost 0.005 % ophthalmic solution Commonly known as:  XALATAN Place 1 drop into the right eye at bedtime.   linagliptin 5 MG Tabs tablet Commonly known as:  TRADJENTA Take 5 mg by mouth daily.   loratadine 10 MG tablet Commonly known as:  CLARITIN Take 10 mg by mouth daily.   metoprolol succinate 50 MG 24 hr tablet Commonly known as:  TOPROL-XL Take 50 mg by mouth daily.   NAMENDA XR 28 MG Cp24 24 hr capsule Generic drug:  memantine Take by mouth.   OCUSOFT EYELID CLEANSING Pads Apply 1 application topically daily.   omeprazole 20 MG capsule Commonly known as:  PRILOSEC Take 20 mg by mouth 2 (two) times daily before a meal.   PARoxetine 20 MG tablet Commonly known as:  PAXIL Take 20 mg by mouth daily.   polyethylene glycol packet Commonly known as:  MIRALAX / GLYCOLAX Take 17 g by mouth daily.   VITAMIN B 12 PO Take 1,000 mcg by mouth daily.   Vitamin D 2000 units Caps Take by mouth.       Review of Systems  Unable to perform ROS: Dementia    Immunization History  Administered Date(s) Administered  . Influenza Inj Mdck Quad Pf 05/09/2016  . Influenza-Unspecified 05/21/2013, 04/26/2014, 05/04/2015  .  Pneumococcal Conjugate-13 09/12/2016  . Pneumococcal Polysaccharide-23 10/11/2009  . Td 10/11/2011  . Zoster 10/17/2010   Pertinent  Health Maintenance Due  Topic Date Due  . FOOT EXAM  08[birth year]05/1931  . OPHTHALMOLOGY EXAM  08/05/1931  . URINE MICROALBUMIN  08/05/1931  . HEMOGLOBIN A1C  03/13/2017  . INFLUENZA VACCINE  Completed  . PNA vac Low Risk Adult  Completed   Fall Risk  03/16/2015 11/07/2014  Falls in the past year? No No  Risk for fall due to : History of fall(s) Impaired balance/gait;Impaired mobility;Medication side effect;History of fall(s)   Wt Readings from Last 3 Encounters:  10/14/16 194 lb 4.8 oz (88.1 kg)  09/12/16 198 lb 9.6 oz (90.1 kg)  08/13/16 198 lb (89.8 kg)   Vitals:   10/14/16 1650  BP: (!) 148/84  Pulse: 74  Resp: 18  Temp: 97.6 F (36.4 C)     Physical Exam  Constitutional: No distress.  HENT:  Head: Normocephalic and atraumatic.  Right Ear: External ear normal. No drainage, swelling or tenderness. Decreased hearing is noted.  Left Ear: External ear and ear canal normal. No drainage or swelling. Decreased hearing is noted.  Nose: Nose normal. Right sinus exhibits no maxillary sinus tenderness and no frontal sinus tenderness. Left sinus exhibits no maxillary sinus tenderness and no frontal sinus tenderness.  Eyes: Conjunctivae are normal. Pupils are equal, round, and reactive to light. Right eye exhibits discharge. Left eye exhibits discharge.  Neck: No JVD present.  Cardiovascular: Normal rate and regular rhythm.   No murmur heard. Pulmonary/Chest: Effort normal. No respiratory distress.  Mild exp wheeze bilat  Abdominal: Soft. Bowel sounds are normal.  Lymphadenopathy:    He has no cervical adenopathy.  Neurological: He is alert.  Oriented to self only  Skin: Skin is warm and dry. He is not diaphoretic.  Psychiatric: He has a normal mood and affect.  Nursing note and vitals reviewed.   Labs reviewed:  Recent Labs  03/15/16 09/13/16    NA 142 144  K 5.2 5.4*  BUN 26* 25*  CREATININE 1.6* 1.6*    Recent Labs  03/15/16  AST 18  ALT 13  ALKPHOS 66    Recent Labs  03/15/16 09/13/16  WBC 10.6 7.9  HGB 11.1* 11.2*  HCT 33* 35*  PLT 218 207   Lab Results  Component Value Date   TSH 2.41 07/27/2014   Lab Results  Component Value Date   HGBA1C 6.6 09/13/2016   Lab Results  Component Value Date   CHOL 150 09/17/2013   HDL 57 09/17/2013   LDLCALC 72 09/17/2013   TRIG 107 09/17/2013   Lab Results  Component Value Date   ALT 13 03/15/2016   AST 18 03/15/2016   ALKPHOS 66 03/15/2016   BILITOT 0.40 01/13/2013  09/13/16: Depakote level 11   Significant Diagnostic Results in last 30 days:  No results found.  Assessment/Plan   1. Mucopurulent chronic bronchitis (HCC) Improved with duonebs BID  2. Oropharyngeal dysphagia Continue modified diet and asp prec No feeding tubes per goals of care  3. Type 2 diabetes mellitus with stage 3 chronic kidney disease, without long-term current use of insulin (HCC) Controlled Continue tradjenta 5 mg qd A1C q 6 mo  4. Late onset Alzheimer's disease with behavioral disturbance Advanced Continue Depakote 125 mg BID and Paxil 20 mg qd due to occasional reports of behaviors  5. Essential hypertension Controlled Contnue Toprol 50 mg qd Goal <150/90  6. CKD (chronic kidney disease) stage 3, GFR 30-59 ml/min Continue to monitor and avoid nephrotoxic agents  7. Iron deficiency anemia due to chronic blood loss Stable CBC  Continue ferrous sulfate   8. Blepharitis of upper and lower eyelids of both eyes, unspecified type Improved Continue Ocusoft scrubs qd and refresh gtts BID  Cindi Carbon, ANP [facility] [phone]

## 2016-10-23 DIAGNOSIS — H31002 Unspecified chorioretinal scars, left eye: Secondary | ICD-10-CM | POA: Diagnosis not present

## 2016-10-23 DIAGNOSIS — E119 Type 2 diabetes mellitus without complications: Secondary | ICD-10-CM | POA: Diagnosis not present

## 2016-10-23 DIAGNOSIS — H2513 Age-related nuclear cataract, bilateral: Secondary | ICD-10-CM | POA: Diagnosis not present

## 2016-11-07 ENCOUNTER — Non-Acute Institutional Stay (SKILLED_NURSING_FACILITY): Payer: Medicare Other | Admitting: Adult Health

## 2016-11-07 DIAGNOSIS — K5901 Slow transit constipation: Secondary | ICD-10-CM

## 2016-11-07 DIAGNOSIS — R1312 Dysphagia, oropharyngeal phase: Secondary | ICD-10-CM | POA: Diagnosis not present

## 2016-11-07 DIAGNOSIS — K219 Gastro-esophageal reflux disease without esophagitis: Secondary | ICD-10-CM | POA: Diagnosis not present

## 2016-11-07 DIAGNOSIS — H906 Mixed conductive and sensorineural hearing loss, bilateral: Secondary | ICD-10-CM

## 2016-11-07 DIAGNOSIS — G301 Alzheimer's disease with late onset: Secondary | ICD-10-CM | POA: Diagnosis not present

## 2016-11-07 DIAGNOSIS — N183 Chronic kidney disease, stage 3 (moderate): Secondary | ICD-10-CM | POA: Diagnosis not present

## 2016-11-07 DIAGNOSIS — F0281 Dementia in other diseases classified elsewhere with behavioral disturbance: Secondary | ICD-10-CM

## 2016-11-07 DIAGNOSIS — J411 Mucopurulent chronic bronchitis: Secondary | ICD-10-CM | POA: Diagnosis not present

## 2016-11-07 DIAGNOSIS — I10 Essential (primary) hypertension: Secondary | ICD-10-CM | POA: Diagnosis not present

## 2016-11-07 DIAGNOSIS — E1122 Type 2 diabetes mellitus with diabetic chronic kidney disease: Secondary | ICD-10-CM | POA: Diagnosis not present

## 2016-11-07 NOTE — Progress Notes (Signed)
Patient ID: [name], male   DOB: [birth year], 83 y.o.   MRN: [id number]   Location:   [facility]   Place of Service:  SNF (31) Provider:   Cindi Carbon, ANP [facility] [phone]   REED, Jonelle Sidle, DO  Patient Care Team: [name], DO as PCP - General (Geriatric Medicine) Raynelle Bring, MD as Consulting Physician (Urology) Jarome Matin, MD as Consulting Physician (Dermatology)  Extended Emergency Contact Information Primary Emergency Contact: Archey,[name] Address: [address]          [place], [place] [address] [name] of [place] Phone: [phone] Mobile Phone: [phone] Relation: Son  Code Status:  DNR Goals of care: Advanced Directive information Advanced Directives 11/07/2016  Does Patient Have a Medical Advance Directive? Yes  Type of Advance Directive Out of facility DNR (pink MOST or yellow form);[place];Living will  Does patient want to make changes to medical advance directive? -  Copy of [place] in Chart? Yes  Pre-existing out of facility DNR order (yellow form or pink MOST form) Yellow form placed in chart (order not valid for inpatient use);[place]ink MOST form placed in chart (order not valid for inpatient use)     Chief Complaint  Patient presents with  . Medical Management of Chronic Issues    HPI:  83 y.o. male residing at [facility] skilled section, medical management of chronic diseases. He has a hx of dementia, DM II, OA, epilepsy, HTN, HLD, GERD, anemia, CKD, recurrent otitis, dysphagia, chronic bronchitis.  Hx of hearing loss but has no interfered with his care, no longer wears hearing aides or sound augmentation device  Chronic bronchitis: previous smoker, hx of aspiration, on duonebs BID scheduled, only occasional non productive cough per staff  DM II on Tradjenta: A1C improved to 6.6 Eye exam 10/23/16  Dysphagia: on puree diet with NTL, tolerating  well  BP controlled, currently on Toprol 50 mg qd, not on an ace due to age/dementia  Dementia: h/o sexually inappropriate behaviors and agitation currently on depakote and paxil. Occasional inappropriate touches or comments per staff but no aggression/hitting/kicking. Dep level 11  GERD: on prilosec 20 mg BID, no reported symptoms, would not stop due to hx of anemia due to GI losses  Constipation: regular BMs per staff on Miralax 17 grams qd  Functional status: Hoyer lift, incontinent   Past Medical History:  Diagnosis Date  . Actinic keratosis   . Alzheimer disease 09/13/2008   Qualifier: Diagnosis of  By: Nils Pyle CMA ([place]), Mearl Latin    . Anemia 2012  . Arthritis   . Cervicalgia 1998   degenereative disease of CS and foraminal narrowing at C5-6  . Coronary atherosclerosis of native coronary artery 2002   60% stenois of the 1st diagonal of the LAD  . Deaf   . Diabetes mellitus 1997  . Epilepsy ([place]) 1943  . Gastric ulcer 2012  . Generalized osteoarthrosis, unspecified site   . Glaucoma   . H/O prostate cancer 2011   treated with radiation  . History of fall 2013  . Hyperlipidemia   . Hypertension 2008  . Macular degeneration (senile) of retina, unspecified   . Major depressive disorder, single episode, unspecified   . Osteoarthritis of knee 10/07/2013   Qualifier: Diagnosis of  By: Nils Pyle CMA (AAMA), Mearl Latin    . Other B-complex deficiencies 2012  . Personal history of colonic polyps 2012   adenomatous  . Reflux esophagitis 2002  .  Type II or unspecified type diabetes mellitus with peripheral circulatory disorders, not stated as uncontrolled(250.70) 05/24/2009   Qualifier: Diagnosis of  By: Caryl Comes, MD, Remus Blake   . Unspecified venous (peripheral) insufficiency   . Unspecified vitamin D deficiency 2010   Past Surgical History:  Procedure Laterality Date  . APPENDECTOMY  1947  . CARPAL TUNNEL RELEASE Bilateral 2005  . COLONOSCOPY  10/22/10   multiple adenomatous  polyps  . MYRINGOTOMY Right   . TONSILLECTOMY  1930    Allergies  Allergen Reactions  . Aspirin   . Nsaids     Allergies as of 11/07/2016      Reactions   Aspirin    Nsaids       Medication List       Accurate as of 11/07/16 11:56 AM. Always use your most recent med list.          acetaminophen 325 MG tablet Commonly known as:  TYLENOL Take 650 mg by mouth 3 (three) times daily.   carboxymethylcellulose 0.5 % Soln Commonly known as:  REFRESH PLUS 2 drops 2 (two) times daily.   divalproex 125 MG capsule Commonly known as:  DEPAKOTE SPRINKLE Take 125 mg by mouth 2 (two) times daily.   ferrous sulfate 325 (65 FE) MG tablet Take 325 mg by mouth daily with breakfast.   ipratropium-albuterol 0.5-2.5 (3) MG/3ML Soln Commonly known as:  DUONEB Take 3 mLs by nebulization 2 (two) times daily.   latanoprost 0.005 % ophthalmic solution Commonly known as:  XALATAN Place 1 drop into the right eye at bedtime.   linagliptin 5 MG Tabs tablet Commonly known as:  TRADJENTA Take 5 mg by mouth daily.   loratadine 10 MG tablet Commonly known as:  CLARITIN Take 10 mg by mouth daily.   metoprolol succinate 50 MG 24 hr tablet Commonly known as:  TOPROL-XL Take 50 mg by mouth daily.   NAMENDA XR 28 MG Cp24 24 hr capsule Generic drug:  memantine Take by mouth.   nystatin cream Commonly known as:  MYCOSTATIN Apply 1 application topically daily. groin   OCUSOFT EYE Pollard OP Apply 1 application to eye daily.   OCUSOFT EYELID CLEANSING Pads Apply 1 application topically daily.   omeprazole 20 MG capsule Commonly known as:  PRILOSEC Take 20 mg by mouth 2 (two) times daily before a meal.   PARoxetine 20 MG tablet Commonly known as:  PAXIL Take 20 mg by mouth daily.   polyethylene glycol packet Commonly known as:  MIRALAX / GLYCOLAX Take 17 g by mouth daily.   VITAMIN B 12 PO Take 1,000 mcg by mouth daily.   Vitamin D 2000 units Caps Take by mouth.        Review of Systems  Unable to perform ROS: Dementia    Immunization History  Administered Date(s) Administered  . Influenza Inj Mdck Quad Pf 05/09/2016  . Influenza-Unspecified 05/21/2013, 04/26/2014, 05/04/2015  . Pneumococcal Conjugate-13 09/12/2016  . Pneumococcal Polysaccharide-23 10/11/2009  . Td 10/11/2011  . Zoster 10/17/2010   Pertinent  Health Maintenance Due  Topic Date Due  . OPHTHALMOLOGY EXAM  08[birth year]05/1931  . URINE MICROALBUMIN  08/05/1931  . INFLUENZA VACCINE  02/19/2017  . HEMOGLOBIN A1C  03/13/2017  . FOOT EXAM  10/10/2017  . PNA vac Low Risk Adult  Completed   Fall Risk  03/16/2015 11/07/2014  Falls in the past year? No No  Risk for fall due to : History of fall(s) Impaired balance/gait;Impaired mobility;Medication side effect;History of  fall(s)   Wt Readings from Last 3 Encounters:  11/07/16 196 lb 3.2 oz (89 kg)  10/14/16 194 lb 4.8 oz (88.1 kg)  09/12/16 198 lb 9.6 oz (90.1 kg)   Vitals:   11/07/16 1153  BP: 140/76  Pulse: 60  Resp: 14  Temp: 97.6 F (36.4 C)     Physical Exam  Constitutional: No distress.  HENT:  Head: Normocephalic and atraumatic.  Right Ear: External ear normal. No drainage, swelling or tenderness. Decreased hearing is noted.  Left Ear: External ear and ear canal normal. No drainage or swelling. Decreased hearing is noted.  Nose: Nose normal. Right sinus exhibits no maxillary sinus tenderness and no frontal sinus tenderness. Left sinus exhibits no maxillary sinus tenderness and no frontal sinus tenderness.  Eyes: Conjunctivae are normal. Pupils are equal, round, and reactive to light. Right eye exhibits no discharge. Left eye exhibits no discharge.  Neck: No JVD present.  Cardiovascular: Normal rate and regular rhythm.   No murmur heard. Pulmonary/Chest: Effort normal and breath sounds normal. No respiratory distress. He has no wheezes.  Abdominal: Soft. Bowel sounds are normal.  Lymphadenopathy:    He has no cervical  adenopathy.  Neurological: He is alert.  Oriented to self only  Skin: Skin is warm and dry. He is not diaphoretic.  Psychiatric: He has a normal mood and affect.  Nursing note and vitals reviewed.   Labs reviewed:  Recent Labs  03/15/16 09/13/16  NA 142 144  K 5.2 5.4*  BUN 26* 25*  CREATININE 1.6* 1.6*    Recent Labs  03/15/16  AST 18  ALT 13  ALKPHOS 66    Recent Labs  03/15/16 09/13/16  WBC 10.6 7.9  HGB 11.1* 11.2*  HCT 33* 35*  PLT 218 207   Lab Results  Component Value Date   TSH 2.41 07/27/2014   Lab Results  Component Value Date   HGBA1C 6.6 09/13/2016   Lab Results  Component Value Date   CHOL 150 09/17/2013   HDL 57 09/17/2013   LDLCALC 72 09/17/2013   TRIG 107 09/17/2013   Lab Results  Component Value Date   ALT 13 03/15/2016   AST 18 03/15/2016   ALKPHOS 66 03/15/2016   BILITOT 0.40 01/13/2013  09/13/16: Depakote level 11   Significant Diagnostic Results in last 30 days:  No results found.  Assessment/Plan  1. Late onset Alzheimer's disease with behavioral disturbance Advanced Continue depakote and paxil for behaviors Continue Namenda for advanced dementia which he may benefit from (reduced behaviors)  2. Type 2 diabetes mellitus with stage 3 chronic kidney disease, without long-term current use of insulin (HCC) Controlled Continue tradjenta  3. Gastroesophageal reflux disease without esophagitis Continue Prilosec 20 mg BID  4. Oropharyngeal dysphagia Continue modified diet and asp prec  5. Mucopurulent chronic bronchitis (HCC) Continue Duonebs BID  6. Essential hypertension Controlled Continue Toprol XL 50 mg qd  7. Mixed conductive and sensorineural hearing loss of both ears Noted but is not an issue during my visits  8. Slow transit constipation Controlled  Continue Miralax 17 grams qd   Cindi Carbon, ANP [facility] [phone]

## 2016-11-13 ENCOUNTER — Encounter: Payer: Self-pay | Admitting: Adult Health

## 2016-11-13 ENCOUNTER — Non-Acute Institutional Stay (SKILLED_NURSING_FACILITY): Payer: Medicare Other

## 2016-11-13 DIAGNOSIS — Z0000 Encounter for general adult medical examination without abnormal findings: Secondary | ICD-10-CM

## 2016-11-13 NOTE — Patient Instructions (Signed)
[name] , Thank you for taking time to come for your Medicare Wellness Visit. I appreciate your ongoing commitment to your health goals. Please review the following plan we discussed and let me know if I can assist you in the future.   Screening recommendations/referrals: Colonoscopy up to date.  Recommended yearly ophthalmology/optometry visit for glaucoma screening and checkup Recommended yearly dental visit for hygiene and checkup  Vaccinations: Influenza vaccine up to date Pneumococcal vaccine up to date Tdap vaccine up to date. Due 10/10/2021 Shingles vaccine up to date. If you want Shingrix we will put in an order.    Advanced directives: In Chart  Conditions/risks identified: None  Next appointment: None upcoming  Preventive Care 65 Years and Older, Male Preventive care refers to lifestyle choices and visits with your health care provider that can promote health and wellness. What does preventive care include?  A yearly physical exam. This is also called an annual well check.  Dental exams once or twice a year.  Routine eye exams. Ask your health care provider how often you should have your eyes checked.  Personal lifestyle choices, including:  Daily care of your teeth and gums.  Regular physical activity.  Eating a healthy diet.  Avoiding tobacco and drug use.  Limiting alcohol use.  Practicing safe sex.  Taking low doses of aspirin every day.  Taking vitamin and mineral supplements as recommended by your health care provider. What happens during an annual well check? The services and screenings done by your health care provider during your annual well check will depend on your age, overall health, lifestyle risk factors, and family history of disease. Counseling  Your health care provider may ask you questions about your:  Alcohol use.  Tobacco use.  Drug use.  Emotional well-being.  Home and relationship well-being.  Sexual activity.  Eating  habits.  History of falls.  Memory and ability to understand (cognition).  Work and work Statistician. Screening  You may have the following tests or measurements:  Height, weight, and BMI.  Blood pressure.  Lipid and cholesterol levels. These may be checked every 5 years, or more frequently if you are over 46 years old.  Skin check.  Lung cancer screening. You may have this screening every year starting at age 49 if you have a 30-pack-year history of smoking and currently smoke or have quit within the past 15 years.  Fecal occult blood test (FOBT) of the stool. You may have this test every year starting at age 2.  Flexible sigmoidoscopy or colonoscopy. You may have a sigmoidoscopy every 5 years or a colonoscopy every 10 years starting at age 44.  Prostate cancer screening. Recommendations will vary depending on your family history and other risks.  Hepatitis C blood test.  Hepatitis B blood test.  Sexually transmitted disease (STD) testing.  Diabetes screening. This is done by checking your blood sugar (glucose) after you have not eaten for a while (fasting). You may have this done every 1-3 years.  Abdominal aortic aneurysm (AAA) screening. You may need this if you are a current or former smoker.  Osteoporosis. You may be screened starting at age 80 if you are at high risk. Talk with your health care provider about your test results, treatment options, and if necessary, the need for more tests. Vaccines  Your health care provider may recommend certain vaccines, such as:  Influenza vaccine. This is recommended every year.  Tetanus, diphtheria, and acellular pertussis (Tdap, Td) vaccine. You may  need a Td booster every 10 years.  Zoster vaccine. You may need this after age 3.  Pneumococcal 13-valent conjugate (PCV13) vaccine. One dose is recommended after age 47.  Pneumococcal polysaccharide (PPSV23) vaccine. One dose is recommended after age 68. Talk to your health  care provider about which screenings and vaccines you need and how often you need them. This information is not intended to replace advice given to you by your health care provider. Make sure you discuss any questions you have with your health care provider. Document Released: 08/04/2015 Document Revised: 03/27/2016 Document Reviewed: 05/09/2015 Elsevier Interactive Patient Education  2017 [place] Prevention in the Home Falls can cause injuries. They can happen to people of all ages. There are many things you can do to make your home safe and to help prevent falls. What can I do on the outside of my home?  Regularly fix the edges of walkways and driveways and fix any cracks.  Remove anything that might make you trip as you walk through a door, such as a raised step or threshold.  Trim any bushes or trees on the path to your home.  Use bright outdoor lighting.  Clear any walking paths of anything that might make someone trip, such as rocks or tools.  Regularly check to see if handrails are loose or broken. Make sure that both sides of any steps have handrails.  Any raised decks and porches should have guardrails on the edges.  Have any leaves, snow, or ice cleared regularly.  Use sand or salt on walking paths during winter.  Clean up any spills in your garage right away. This includes oil or grease spills. What can I do in the bathroom?  Use night lights.  Install grab bars by the toilet and in the tub and shower. Do not use towel bars as grab bars.  Use non-skid mats or decals in the tub or shower.  If you need to sit down in the shower, use a plastic, non-slip stool.  Keep the floor dry. Clean up any water that spills on the floor as soon as it happens.  Remove soap buildup in the tub or shower regularly.  Attach bath mats securely with double-sided non-slip rug tape.  Do not have throw rugs and other things on the floor that can make you trip. What can I do  in the bedroom?  Use night lights.  Make sure that you have a light by your bed that is easy to reach.  Do not use any sheets or blankets that are too big for your bed. They should not hang down onto the floor.  Have a firm chair that has side arms. You can use this for support while you get dressed.  Do not have throw rugs and other things on the floor that can make you trip. What can I do in the kitchen?  Clean up any spills right away.  Avoid walking on wet floors.  Keep items that you use a lot in easy-to-reach places.  If you need to reach something above you, use a strong step stool that has a grab bar.  Keep electrical cords out of the way.  Do not use floor polish or wax that makes floors slippery. If you must use wax, use non-skid floor wax.  Do not have throw rugs and other things on the floor that can make you trip. What can I do with my stairs?  Do not leave any items on  the stairs.  Make sure that there are handrails on both sides of the stairs and use them. Fix handrails that are broken or loose. Make sure that handrails are as long as the stairways.  Check any carpeting to make sure that it is firmly attached to the stairs. Fix any carpet that is loose or worn.  Avoid having throw rugs at the top or bottom of the stairs. If you do have throw rugs, attach them to the floor with carpet tape.  Make sure that you have a light switch at the top of the stairs and the bottom of the stairs. If you do not have them, ask someone to add them for you. What else can I do to help prevent falls?  Wear shoes that:  Do not have high heels.  Have rubber bottoms.  Are comfortable and fit you well.  Are closed at the toe. Do not wear sandals.  If you use a stepladder:  Make sure that it is fully opened. Do not climb a closed stepladder.  Make sure that both sides of the stepladder are locked into place.  Ask someone to hold it for you, if possible.  Clearly mark  and make sure that you can see:  Any grab bars or handrails.  First and last steps.  Where the edge of each step is.  Use tools that help you move around (mobility aids) if they are needed. These include:  Canes.  Walkers.  Scooters.  Crutches.  Turn on the lights when you go into a dark area. Replace any light bulbs as soon as they burn out.  Set up your furniture so you have a clear path. Avoid moving your furniture around.  If any of your floors are uneven, fix them.  If there are any pets around you, be aware of where they are.  Review your medicines with your doctor. Some medicines can make you feel dizzy. This can increase your chance of falling. Ask your doctor what other things that you can do to help prevent falls. This information is not intended to replace advice given to you by your health care provider. Make sure you discuss any questions you have with your health care provider. Document Released: 05/04/2009 Document Revised: 12/14/2015 Document Reviewed: 08/12/2014 Elsevier Interactive Patient Education  2017 [employer].

## 2016-11-13 NOTE — Progress Notes (Signed)
Subjective:   [name] is a 83 y.o. male who presents for Medicare Annual/Subsequent preventive examination at [facility].   Cardiac Risk Factors include: advanced age (>13men, >10 women);hypertension;male gender;smoking/ tobacco exposure;family history of premature cardiovascular disease;sedentary lifestyle;obesity (BMI >30kg/m2);dyslipidemia;diabetes mellitus     Objective:    Vitals: BP 138/60 (BP Location: Right Arm, Patient Position: Sitting)   Pulse 60   Temp 97.6 F (36.4 C) (Oral)   Ht 5\' 6"  (1.676 m)   Wt 196 lb (88.9 kg)   SpO2 94%   BMI 31.64 kg/m   Body mass index is 31.64 kg/m.  Tobacco History  Smoking Status  . Former Smoker  . Packs/day: 0.10  . Years: 20.00  . Types: Cigarettes  . Quit date: 10/22/1963  Smokeless Tobacco  . Never Used     Counseling given: Not Answered   Past Medical History:  Diagnosis Date  . Actinic keratosis   . Alzheimer disease 09/13/2008   Qualifier: Diagnosis of  By: Nils Pyle CMA ([place]), Mearl Latin    . Anemia 2012  . Arthritis   . Cervicalgia 1998   degenereative disease of CS and foraminal narrowing at C5-6  . Coronary atherosclerosis of native coronary artery 2002   60% stenois of the 1st diagonal of the LAD  . Deaf   . Diabetes mellitus 1997  . Epilepsy ([place]) 1943  . Gastric ulcer 2012  . Generalized osteoarthrosis, unspecified site   . Glaucoma   . H/O prostate cancer 2011   treated with radiation  . History of fall 2013  . Hyperlipidemia   . Hypertension 2008  . Macular degeneration (senile) of retina, unspecified   . Major depressive disorder, single episode, unspecified   . Osteoarthritis of knee 10/07/2013   Qualifier: Diagnosis of  By: Nils Pyle CMA (AAMA), Mearl Latin    . Other B-complex deficiencies 2012  . Personal history of colonic polyps 2012   adenomatous  . Reflux esophagitis 2002  . Type II or unspecified type diabetes mellitus with peripheral circulatory disorders, not stated as  uncontrolled(250.70) 05/24/2009   Qualifier: Diagnosis of  By: Caryl Comes, MD, Remus Blake   . Unspecified venous (peripheral) insufficiency   . Unspecified vitamin D deficiency 2010   Past Surgical History:  Procedure Laterality Date  . APPENDECTOMY  1947  . CARPAL TUNNEL RELEASE Bilateral 2005  . COLONOSCOPY  10/22/10   multiple adenomatous polyps  . MYRINGOTOMY Right   . TONSILLECTOMY  1930   Family History  Problem Relation Age of Onset  . Heart disease Mother   . Dementia Mother    History  Sexual Activity  . Sexual activity: No    Outpatient Encounter Prescriptions as of 11/13/2016  Medication Sig  . acetaminophen (TYLENOL) 325 MG tablet Take 650 mg by mouth 3 (three) times daily.   . carboxymethylcellulose (REFRESH PLUS) 0.5 % SOLN 2 drops 2 (two) times daily.  . Cholecalciferol (VITAMIN D) 2000 UNITS CAPS Take by mouth.  . Cyanocobalamin (VITAMIN B 12 PO) Take 1,000 mcg by mouth daily.  . divalproex (DEPAKOTE SPRINKLE) 125 MG capsule Take 125 mg by mouth 2 (two) times daily.  . Eyelid Cleansers (OCUSOFT EYELID CLEANSING) PADS Apply 1 application topically daily.  . ferrous sulfate 325 (65 FE) MG tablet Take 325 mg by mouth daily with breakfast.  . ipratropium-albuterol (DUONEB) 0.5-2.5 (3) MG/3ML SOLN Take 3 mLs by nebulization 2 (two) times daily.  Marland Kitchen latanoprost (XALATAN) 0.005 % ophthalmic solution Place 1 drop into the right  eye at bedtime.  Marland Kitchen linagliptin (TRADJENTA) 5 MG TABS tablet Take 5 mg by mouth daily.  Marland Kitchen loratadine (CLARITIN) 10 MG tablet Take 10 mg by mouth daily.   . Memantine HCl ER (NAMENDA XR) 28 MG CP24 Take by mouth.  . metoprolol (TOPROL-XL) 50 MG 24 hr tablet Take 50 mg by mouth daily.    Marland Kitchen nystatin cream (MYCOSTATIN) Apply 1 application topically daily. groin  . omeprazole (PRILOSEC) 20 MG capsule Take 20 mg by mouth 2 (two) times daily before a meal.  . Ophthalmic Irrigation Solution (OCUSOFT EYE [place] OP) Apply 1 application to eye daily.  Marland Kitchen  PARoxetine (PAXIL) 20 MG tablet Take 20 mg by mouth daily.  . polyethylene glycol (MIRALAX / GLYCOLAX) packet Take 17 g by mouth daily.   No facility-administered encounter medications on file as of 11/13/2016.     Activities of Daily Living In your present state of health, do you have any difficulty performing the following activities: 11/13/2016  Hearing? Y  Vision? Y  Difficulty concentrating or making decisions? N  Walking or climbing stairs? Y  Dressing or bathing? Y  Doing errands, shopping? Y  Preparing Food and eating ? Y  Using the Toilet? Y  In the past six months, have you accidently leaked urine? Y  Do you have problems with loss of bowel control? Y  Managing your Medications? Y  Managing your Finances? Y  Housekeeping or managing your Housekeeping? Y  Some recent data might be hidden    Patient Care Team: [name], DO as PCP - General (Geriatric Medicine) Raynelle Bring, MD as Consulting Physician (Urology) Jarome Matin, MD as Consulting Physician (Dermatology)   Assessment:     Exercise Activities and Dietary recommendations Current Exercise Habits: The patient does not participate in regular exercise at present, Time (Minutes): 40  Goals    . Maintain Lifestyle          Pt will maintain current Lifestyle      Fall Risk Fall Risk  11/13/2016 03/16/2015 11/07/2014  Falls in the past year? No No No  Risk for fall due to : - History of fall(s) Impaired balance/gait;Impaired mobility;Medication side effect;History of fall(s)   Depression Screen PHQ 2/9 Scores 11/13/2016 03/16/2015 11/07/2014  PHQ - 2 Score 0 0 -  Exception Documentation - - Other- indicate reason in comment box  Not completed - - not able to answer fully due to dementia    Cognitive Function MMSE - Mini Mental State Exam 10/08/2016  Orientation to time 0  Orientation to Place 2  Registration 3  Attention/ Calculation 2  Recall 0  Language- name 2 objects 2  Language- repeat 0    Language- follow 3 step command 0  Language- read & follow direction 0  Write a sentence 0  Copy design 0  Total score 9        Immunization History  Administered Date(s) Administered  . Influenza Inj Mdck Quad Pf 05/09/2016  . Influenza-Unspecified 05/21/2013, 04/26/2014, 05/04/2015  . Pneumococcal Conjugate-13 09/12/2016  . Pneumococcal Polysaccharide-23 10/11/2009  . Td 10/11/2011  . Zoster 10/17/2010   Screening Tests Health Maintenance  Topic Date Due  . OPHTHALMOLOGY EXAM  08/05/1931  . URINE MICROALBUMIN  08/05/1931  . INFLUENZA VACCINE  02/19/2017  . HEMOGLOBIN A1C  03/13/2017  . FOOT EXAM  10/10/2017  . TETANUS/TDAP  10/10/2021  . PNA vac Low Risk Adult  Completed      Plan:  I have personally reviewed  and addressed the Medicare Annual Wellness questionnaire and have noted the following in the patient's chart:  A. Medical and social history B. Use of alcohol, tobacco or illicit drugs  C. Current medications and supplements D. Functional ability and status E.  Nutritional status F.  Physical activity G. Advance directives H. List of other physicians I.  Hospitalizations, surgeries, and ER visits in previous 12 months J.  [place] to include hearing, vision, cognitive, depression L. Referrals and appointments - none  In addition, I have reviewed and discussed with patient certain preventive protocols, quality metrics, and best practice recommendations. A written personalized care plan for preventive services as well as general preventive health recommendations were provided to patient.  See attached scanned questionnaire for additional information.   Signed,   Rich Reining, RN Nurse Health Advisor

## 2016-11-13 NOTE — Progress Notes (Signed)
   I reviewed health advisor's note, was available for consultation and agree with the assessment and plan as written.  Will consider urine microalbumin at next visit, but patient has advanced dementia so benefits of adding an ace/arb are limited at this point.  Davie Sagona L. Levander Katzenstein, D.O. [facility] [address],  [address] Cell Phone (Mon-Fri 8am-5pm):  [phone] On Call:  [phone] & follow prompts after 5pm & weekends Office Phone:  [phone] Office Fax:  [phone]   Quick Notes   Health Maintenance: Urine microalbumin due.     Abnormal Screen: MMSE 9/30. Unable to draw clock. Taken 10/08/2016     Patient Concerns: None     Nurse Concerns: None

## 2016-12-03 ENCOUNTER — Non-Acute Institutional Stay (SKILLED_NURSING_FACILITY): Payer: Medicare Other | Admitting: Internal Medicine

## 2016-12-03 DIAGNOSIS — I10 Essential (primary) hypertension: Secondary | ICD-10-CM | POA: Diagnosis not present

## 2016-12-03 DIAGNOSIS — F0281 Dementia in other diseases classified elsewhere with behavioral disturbance: Secondary | ICD-10-CM

## 2016-12-03 DIAGNOSIS — G301 Alzheimer's disease with late onset: Secondary | ICD-10-CM | POA: Diagnosis not present

## 2016-12-03 DIAGNOSIS — E1122 Type 2 diabetes mellitus with diabetic chronic kidney disease: Secondary | ICD-10-CM | POA: Diagnosis not present

## 2016-12-03 DIAGNOSIS — N183 Chronic kidney disease, stage 3 unspecified: Secondary | ICD-10-CM

## 2016-12-03 DIAGNOSIS — J411 Mucopurulent chronic bronchitis: Secondary | ICD-10-CM

## 2016-12-03 DIAGNOSIS — D500 Iron deficiency anemia secondary to blood loss (chronic): Secondary | ICD-10-CM

## 2016-12-03 DIAGNOSIS — F02818 Dementia in other diseases classified elsewhere, unspecified severity, with other behavioral disturbance: Secondary | ICD-10-CM

## 2016-12-03 NOTE — Progress Notes (Signed)
Patient ID: [name], male   DOB: [birth year], 83 y.o.   MRN: [id number]  Location:  [facility] Room Number: 127 Place of Service:  SNF [phone] Provider:   [name], DO  Patient Care Team: [name], DO as PCP - General (Geriatric Medicine) Raynelle Bring, MD as Consulting Physician (Urology) Jarome Matin, MD as Consulting Physician (Dermatology)  Extended Emergency Contact Information Primary Emergency Contact: Will,[name] Address: [address]          [place], [place] [address] [name] of [place] Phone: [phone] Mobile Phone: [phone] Relation: Son  Code Status:  DNR Goals of care: Advanced Directive information Advanced Directives 12/03/2016  Does Patient Have a Medical Advance Directive? Yes  Type of Advance Directive Out of facility DNR (pink MOST or yellow form);[place];Living will  Does patient want to make changes to medical advance directive? -  Copy of [place] in Chart? Yes  Would patient like information on creating a medical advance directive? -  Pre-existing out of facility DNR order (yellow form or pink MOST form) Yellow form placed in chart (order not valid for inpatient use);Pink MOST form placed in chart (order not valid for inpatient use)   Chief Complaint  Patient presents with  . Medical Management of Chronic Issues    routine visit    HPI:  Pt is a 83 y.o. male seen today for medical management of chronic diseases.    He had his wellness visit yesterday with Clarise Cruz, RN.  He scored 9/30 on MMSE and failed clock 10/08/16.   He has a hx of dementia, DM II, OA, epilepsy, HTN, HLD, GERD, anemia, CKD, recurrent otitis, dysphagia, chronic bronchitis.  Chronic bronchitis: previous smoker, hx of aspiration, on duonebs BID scheduled, only occasional non productive cough per staff.  Also on claritin for PND/allergies.  DM II on Tradjenta only hbaC  improved to 6.6 in feb from 7.2 last year.  Eye exam 10/23/16.  Urine microalbumin due.  Dysphagia: on puree diet with NTL, tolerating well  BP controlled, currently on Toprol 50 mg qd, not on an ace due to advanced dementia.    Dementia: h/o sexually inappropriate behaviors and agitation.  currently on depakote and paxil. Occasional inappropriate touches or comments per staff but no aggression/hitting/kicking. Dep level 11.  Also remains on namenda for the behaviors also.    GERD: on prilosec 20 mg BID, no reported symptoms, would not stop due to hx of anemia due to GI losses.  Last h/h in feb was 11.2/35.  Constipation: regular BMs per staff on Miralax 17 grams qd.  Functional status: Hoyer lift, incontinent, dependent except able to feed himself  Past Medical History:  Diagnosis Date  . Actinic keratosis   . Alzheimer disease 09/13/2008   Qualifier: Diagnosis of  By: Nils Pyle CMA ([place]), Mearl Latin    . Anemia 2012  . Arthritis   . Cervicalgia 1998   degenereative disease of CS and foraminal narrowing at C5-6  . Coronary atherosclerosis of native coronary artery 2002   60% stenois of the 1st diagonal of the LAD  . Deaf   . Diabetes mellitus 1997  . Epilepsy ([place]) 1943  . Gastric ulcer 2012  . Generalized osteoarthrosis, unspecified site   . Glaucoma   . H/O prostate cancer 2011   treated with radiation  . History of fall 2013  . Hyperlipidemia   . Hypertension 2008  . Macular degeneration (senile)  of retina, unspecified   . Major depressive disorder, single episode, unspecified   . Osteoarthritis of knee 10/07/2013   Qualifier: Diagnosis of  By: Nils Pyle CMA (AAMA), Mearl Latin    . Other B-complex deficiencies 2012  . Personal history of colonic polyps 2012   adenomatous  . Reflux esophagitis 2002  . Type II or unspecified type diabetes mellitus with peripheral circulatory disorders, not stated as uncontrolled(250.70) 05/24/2009   Qualifier: Diagnosis of  By: Caryl Comes, MD, Remus Blake   . Unspecified venous (peripheral) insufficiency   . Unspecified vitamin D deficiency 2010   Past Surgical History:  Procedure Laterality Date  . APPENDECTOMY  1947  . CARPAL TUNNEL RELEASE Bilateral 2005  . COLONOSCOPY  10/22/10   multiple adenomatous polyps  . MYRINGOTOMY Right   . TONSILLECTOMY  1930    Allergies  Allergen Reactions  . Aspirin   . Nsaids     Outpatient Encounter Prescriptions as of 12/03/2016  Medication Sig  . acetaminophen (TYLENOL) 325 MG tablet Take 650 mg by mouth 3 (three) times daily.   . carboxymethylcellulose (REFRESH PLUS) 0.5 % SOLN 2 drops 2 (two) times daily.  . Cholecalciferol (VITAMIN D) 2000 UNITS CAPS Take by mouth.  . Cyanocobalamin (VITAMIN B 12 PO) Take 1,000 mcg by mouth daily.  Marland Kitchen dextromethorphan (DELSYM) 30 MG/5ML liquid Take 5 mLs by mouth 2 (two) times daily as needed for cough.  . divalproex (DEPAKOTE SPRINKLE) 125 MG capsule Take 125 mg by mouth 2 (two) times daily.  . Eyelid Cleansers (OCUSOFT EYELID CLEANSING) PADS Apply 1 application topically daily.  . ferrous sulfate 325 (65 FE) MG tablet Take 325 mg by mouth daily with breakfast.  . ipratropium-albuterol (DUONEB) 0.5-2.5 (3) MG/3ML SOLN Take 3 mLs by nebulization 2 (two) times daily.  Marland Kitchen latanoprost (XALATAN) 0.005 % ophthalmic solution Place 1 drop into the right eye at bedtime.  Marland Kitchen linagliptin (TRADJENTA) 5 MG TABS tablet Take 5 mg by mouth daily.  Marland Kitchen loratadine (CLARITIN) 10 MG tablet Take 10 mg by mouth daily.   . Memantine HCl ER (NAMENDA XR) 28 MG CP24 Take by mouth.  . metoprolol (TOPROL-XL) 50 MG 24 hr tablet Take 50 mg by mouth daily.    Marland Kitchen nystatin cream (MYCOSTATIN) Apply 1 application topically daily. groin  . omeprazole (PRILOSEC) 20 MG capsule Take 20 mg by mouth 2 (two) times daily before a meal.  . Ophthalmic Irrigation Solution (OCUSOFT EYE [place] OP) Apply 1 application to eye daily.  Marland Kitchen oxycodone (OXY-IR) 5 MG capsule Take 5 mg by mouth 3 (three)  times daily as needed for pain.  Marland Kitchen PARoxetine (PAXIL) 20 MG tablet Take 20 mg by mouth daily.  . polyethylene glycol (MIRALAX / GLYCOLAX) packet Take 17 g by mouth daily.   No facility-administered encounter medications on file as of 12/03/2016.     Review of Systems  Unable to perform ROS: Dementia (see hpi for info from nursing)    Immunization History  Administered Date(s) Administered  . Influenza Inj Mdck Quad Pf 05/09/2016  . Influenza-Unspecified 05/21/2013, 04/26/2014, 05/04/2015  . Pneumococcal Conjugate-13 09/12/2016  . Pneumococcal Polysaccharide-23 10/12/2010  . Td 10/11/2011  . Zoster 10/17/2010   Pertinent  Health Maintenance Due  Topic Date Due  . URINE MICROALBUMIN  08/05/1933  . INFLUENZA VACCINE  02/19/2017  . HEMOGLOBIN A1C  03/13/2017  . FOOT EXAM  10/10/2017  . OPHTHALMOLOGY EXAM  10/23/2017  . PNA vac Low Risk Adult  Completed   Fall Risk  11/13/2016 03/16/2015 11/07/2014  Falls in the past year? No No No  Risk for fall due to : - History of fall(s) Impaired balance/gait;Impaired mobility;Medication side effect;History of fall(s)   Functional Status Survey:    Vitals:   12/03/16 1346  BP: 128/78  Pulse: 70  Resp: 20  Temp: 97.3 F (36.3 C)  TempSrc: Oral  SpO2: 93%  Weight: 194 lb (88 kg)   Body mass index is 31.31 kg/m. Physical Exam  Constitutional: He appears well-developed and well-nourished. No distress.  Cardiovascular: Normal rate, regular rhythm, normal heart sounds and intact distal pulses.   Pulmonary/Chest: Effort normal and breath sounds normal. No respiratory distress. He has no wheezes. He has no rales.  Abdominal: Soft. Bowel sounds are normal. He exhibits no distension. There is no tenderness.  Musculoskeletal: Normal range of motion.  In his wheelchair in the hall by the floral garden as usual  Neurological: He is alert.  Skin: Skin is warm and dry.  Psychiatric: He has a normal mood and affect.    Labs  reviewed:  Recent Labs  03/15/16 09/13/16  NA 142 144  K 5.2 5.4*  BUN 26* 25*  CREATININE 1.6* 1.6*    Recent Labs  03/15/16  AST 18  ALT 13  ALKPHOS 66    Recent Labs  03/15/16 09/13/16  WBC 10.6 7.9  HGB 11.1* 11.2*  HCT 33* 35*  PLT 218 207   Lab Results  Component Value Date   TSH 2.41 07/27/2014   Lab Results  Component Value Date   HGBA1C 6.6 09/13/2016   Lab Results  Component Value Date   CHOL 150 09/17/2013   HDL 57 09/17/2013   LDLCALC 72 09/17/2013   TRIG 107 09/17/2013    Assessment/Plan 1. Essential hypertension -bp satisfactory considering his comfort goals of care with advanced dementia, no changes  2. Type 2 diabetes mellitus with stage 3 chronic kidney disease, without long-term current use of insulin (HCC) hba1c improved, monitor 1-2x per year On tradjenta  3. Late onset Alzheimer's disease with behavioral disturbance Cont current medication regimen No agitation  Might consider tapering off namenda given his functional independence--not clear which of the three meds actually helped with the sexual disinhibition  4. Mucopurulent chronic bronchitis (HCC) -chronic, cont duonebs prn  5. Iron deficiency anemia due to chronic blood loss -cont ppi for gi prophylaxis due to this  6. CKD (chronic kidney disease) stage 3, GFR 30-59 ml/min Avoid nephrotoxic agents like nsaids, dose adjust renally excreted meds, hydrate.  Family/ staff Communication: discussed with snf nurse  Labs/tests ordered:  RN may have ordered urine microalbumin which could be deferred at his age and debility  Maxamillion Banas L. Adylee Leonardo, D.O. [facility] [address], [place] [address] Cell Phone (Mon-Fri 8am-5pm):  [phone] On Call:  [phone] & follow prompts after 5pm & weekends Office Phone:  [phone] Office Fax:  [phone]

## 2017-01-06 ENCOUNTER — Non-Acute Institutional Stay (SKILLED_NURSING_FACILITY): Payer: Medicare Other | Admitting: Adult Health

## 2017-01-06 DIAGNOSIS — R1312 Dysphagia, oropharyngeal phase: Secondary | ICD-10-CM

## 2017-01-06 DIAGNOSIS — N183 Chronic kidney disease, stage 3 unspecified: Secondary | ICD-10-CM

## 2017-01-06 DIAGNOSIS — E1122 Type 2 diabetes mellitus with diabetic chronic kidney disease: Secondary | ICD-10-CM

## 2017-01-06 DIAGNOSIS — R634 Abnormal weight loss: Secondary | ICD-10-CM | POA: Diagnosis not present

## 2017-01-06 DIAGNOSIS — J411 Mucopurulent chronic bronchitis: Secondary | ICD-10-CM | POA: Diagnosis not present

## 2017-01-14 ENCOUNTER — Encounter: Payer: Self-pay | Admitting: Adult Health

## 2017-01-14 NOTE — Progress Notes (Signed)
Patient ID: [name], male   DOB: 1921-08-18, 83 y.o.   MRN: [id number]   Location:   [facility]   Place of Service:  SNF (31) Provider:   Cindi Carbon, ANP [facility] [phone]   [name], DO  Patient Care Team: [name], DO as PCP - General (Geriatric Medicine) Raynelle Bring, MD as Consulting Physician (Urology) Jarome Matin, MD as Consulting Physician (Dermatology)  Extended Emergency Contact Information Primary Emergency Contact: Simms,[name] Address: [address]          [place], [place] [address] [name] of [place] Phone: [phone] Mobile Phone: [phone] Relation: Son  Code Status:  DNR Goals of care: Advanced Directive information Advanced Directives 12/03/2016  Does Patient Have a Medical Advance Directive? Yes  Type of Advance Directive Out of facility DNR (pink MOST or yellow form);[place];Living will  Does patient want to make changes to medical advance directive? -  Copy of [place] in Chart? Yes  Would patient like information on creating a medical advance directive? -  Pre-existing out of facility DNR order (yellow form or pink MOST form) Yellow form placed in chart (order not valid for inpatient use);Pink MOST form placed in chart (order not valid for inpatient use)     Chief Complaint  Patient presents with  . Medical Management of Chronic Issues    HPI:  83 y.o. male residing at [facility] skilled section, medical management of chronic diseases. He has a hx of dementia, DM II, OA, epilepsy, HTN, HLD, GERD, anemia, CKD, recurrent otitis, dysphagia, chronic bronchitis.  Weight loss: has lost 10 lbs in the past month, decreased intake  Chronic bronchitis: previous smoker, hx of aspiration, on duonebs BID scheduled, only occasional non productive cough per staff  DM II on Tradjenta: A1C improved to 6.27 Aug 2016, Eye exam 10/23/16 CBGS  114-120  Dysphagia: on puree diet with NTL, tolerating well  Dementia: h/o sexually inappropriate behaviors and agitation currently on depakote and paxil. Occasional inappropriate touches or comments per staff but no aggression/hitting/kicking. Dep level 01 Sep 2016  CKD Stage III Lab Results  Component Value Date   BUN 25 (A) 09/13/2016   Lab Results  Component Value Date   CREATININE 1.6 (A) 09/13/2016    Functional status: Harrel Lemon lift, incontinent   Past Medical History:  Diagnosis Date  . Actinic keratosis   . Alzheimer disease 09/13/2008   Qualifier: Diagnosis of  By: Nils Pyle CMA ([place]), Mearl Latin    . Anemia 2012  . Arthritis   . Cervicalgia 1998   degenereative disease of CS and foraminal narrowing at C5-6  . Coronary atherosclerosis of native coronary artery 2002   60% stenois of the 1st diagonal of the LAD  . Deaf   . Diabetes mellitus 1997  . Epilepsy ([place]) 1943  . Gastric ulcer 2012  . Generalized osteoarthrosis, unspecified site   . Glaucoma   . H/O prostate cancer 2011   treated with radiation  . History of fall 2013  . Hyperlipidemia   . Hypertension 2008  . Macular degeneration (senile) of retina, unspecified   . Major depressive disorder, single episode, unspecified   . Osteoarthritis of knee 10/07/2013   Qualifier: Diagnosis of  By: Nils Pyle CMA (AAMA), Mearl Latin    . Other B-complex deficiencies 2012  . Personal history of colonic polyps 2012   adenomatous  . Reflux esophagitis 2002  . Type II or unspecified type diabetes  mellitus with peripheral circulatory disorders, not stated as uncontrolled(250.70) 05/24/2009   Qualifier: Diagnosis of  By: Caryl Comes, MD, Remus Blake   . Unspecified venous (peripheral) insufficiency   . Unspecified vitamin D deficiency 2010   Past Surgical History:  Procedure Laterality Date  . APPENDECTOMY  1947  . CARPAL TUNNEL RELEASE Bilateral 2005  . COLONOSCOPY  10/22/10   multiple adenomatous polyps  . MYRINGOTOMY Right     . TONSILLECTOMY  1930    Allergies  Allergen Reactions  . Aspirin   . Nsaids     Allergies as of 01/06/2017      Reactions   Aspirin    Nsaids       Medication List       Accurate as of 01/06/17 11:59 PM. Always use your most recent med list.          acetaminophen 325 MG tablet Commonly known as:  TYLENOL Take 650 mg by mouth 3 (three) times daily.   carboxymethylcellulose 0.5 % Soln Commonly known as:  REFRESH PLUS 2 drops 2 (two) times daily.   DELSYM 30 MG/5ML liquid Generic drug:  dextromethorphan Take 5 mLs by mouth 2 (two) times daily as needed for cough.   divalproex 125 MG capsule Commonly known as:  DEPAKOTE SPRINKLE Take 125 mg by mouth 2 (two) times daily.   ferrous sulfate 325 (65 FE) MG tablet Take 325 mg by mouth daily with breakfast.   ipratropium-albuterol 0.5-2.5 (3) MG/3ML Soln Commonly known as:  DUONEB Take 3 mLs by nebulization 2 (two) times daily.   latanoprost 0.005 % ophthalmic solution Commonly known as:  XALATAN Place 1 drop into the right eye at bedtime.   linagliptin 5 MG Tabs tablet Commonly known as:  TRADJENTA Take 5 mg by mouth daily.   loratadine 10 MG tablet Commonly known as:  CLARITIN Take 10 mg by mouth daily.   metoprolol succinate 50 MG 24 hr tablet Commonly known as:  TOPROL-XL Take 50 mg by mouth daily.   NAMENDA XR 28 MG Cp24 24 hr capsule Generic drug:  memantine Take by mouth.   nystatin cream Commonly known as:  MYCOSTATIN Apply 1 application topically daily. groin   OCUSOFT EYELID CLEANSING Pads Apply 1 application topically daily.   omeprazole 20 MG capsule Commonly known as:  PRILOSEC Take 20 mg by mouth 2 (two) times daily before a meal.   PARoxetine 20 MG tablet Commonly known as:  PAXIL Take 20 mg by mouth daily.   polyethylene glycol packet Commonly known as:  MIRALAX / GLYCOLAX Take 17 g by mouth daily.   VITAMIN B 12 PO Take 1,000 mcg by mouth daily.   Vitamin D 2000 units  Caps Take by mouth.       Review of Systems  Unable to perform ROS: Dementia    Immunization History  Administered Date(s) Administered  . Influenza Inj Mdck Quad Pf 05/09/2016  . Influenza-Unspecified 05/21/2013, 04/26/2014, 05/04/2015  . Pneumococcal Conjugate-13 09/12/2016  . Pneumococcal Polysaccharide-23 10/12/2010  . Td 10/11/2011  . Zoster 10/17/2010   Pertinent  Health Maintenance Due  Topic Date Due  . URINE MICROALBUMIN  08/05/1933  . INFLUENZA VACCINE  02/19/2017  . HEMOGLOBIN A1C  03/13/2017  . FOOT EXAM  10/10/2017  . OPHTHALMOLOGY EXAM  10/23/2017  . PNA vac Low Risk Adult  Completed   Fall Risk  11/13/2016 03/16/2015 11/07/2014  Falls in the past year? No No No  Risk for fall due to : -  History of fall(s) Impaired balance/gait;Impaired mobility;Medication side effect;History of fall(s)   Wt Readings from Last 3 Encounters:  01/14/17 184 lb 6.4 oz (83.6 kg)  12/03/16 194 lb (88 kg)  11/13/16 196 lb (88.9 kg)   Vitals:   01/14/17 0737  BP: (!) 100/58  Pulse: 60  Resp: 18  Temp: 97.8 F (36.6 C)     Physical Exam  Constitutional: No distress.  HENT:  Head: Normocephalic and atraumatic.  Right Ear: External ear normal. No drainage, swelling or tenderness. Decreased hearing is noted.  Left Ear: External ear and ear canal normal. No drainage or swelling. Decreased hearing is noted.  Nose: Nose normal. Right sinus exhibits no maxillary sinus tenderness and no frontal sinus tenderness. Left sinus exhibits no maxillary sinus tenderness and no frontal sinus tenderness.  Eyes: Conjunctivae are normal. Pupils are equal, round, and reactive to light. Right eye exhibits no discharge. Left eye exhibits no discharge.  Neck: No JVD present.  Cardiovascular: Normal rate and regular rhythm.   No murmur heard. Pulmonary/Chest: Effort normal and breath sounds normal. No respiratory distress. He has no wheezes.  Abdominal: Soft. Bowel sounds are normal.    Lymphadenopathy:    He has no cervical adenopathy.  Neurological: He is alert.  Oriented to self only  Skin: Skin is warm and dry. He is not diaphoretic.  Psychiatric: He has a normal mood and affect.  Nursing note and vitals reviewed.   Labs reviewed:  Recent Labs  03/15/16 09/13/16  NA 142 144  K 5.2 5.4*  BUN 26* 25*  CREATININE 1.6* 1.6*    Recent Labs  03/15/16  AST 18  ALT 13  ALKPHOS 66    Recent Labs  03/15/16 09/13/16  WBC 10.6 7.9  HGB 11.1* 11.2*  HCT 33* 35*  PLT 218 207   Lab Results  Component Value Date   TSH 2.41 07/27/2014   Lab Results  Component Value Date   HGBA1C 6.6 09/13/2016   Lab Results  Component Value Date   CHOL 150 09/17/2013   HDL 57 09/17/2013   LDLCALC 72 09/17/2013   TRIG 107 09/17/2013   Lab Results  Component Value Date   ALT 13 03/15/2016   AST 18 03/15/2016   ALKPHOS 66 03/15/2016   BILITOT 0.40 01/13/2013  09/13/16: Depakote level 11   Significant Diagnostic Results in last 30 days:  No results found.  Assessment/Plan  1. Weight loss Will check TSH but most likely due to advancing dementia Consider discontinuing Namenda  2. Type 2 diabetes mellitus with stage 3 chronic kidney disease, without long-term current use of insulin (HCC) Change CBGS checks to qam every other Monday due to consistently normal readings. Continue Tradjenta and monitor A1C q 6 mo  3. Mucopurulent chronic bronchitis (HCC) No acute exacerbations Continue Duonebs BID scheduled  4. CKD (chronic kidney disease) stage 3, GFR 30-59 ml/min Monitor BMP q 6 mo and avoid nephrotoxic agents  5. Oropharyngeal dysphagia Continue modified and asp prec   Cindi Carbon, ANP [facility] [phone]

## 2017-01-15 DIAGNOSIS — E039 Hypothyroidism, unspecified: Secondary | ICD-10-CM | POA: Diagnosis not present

## 2017-01-15 DIAGNOSIS — E119 Type 2 diabetes mellitus without complications: Secondary | ICD-10-CM | POA: Diagnosis not present

## 2017-01-15 DIAGNOSIS — R634 Abnormal weight loss: Secondary | ICD-10-CM | POA: Diagnosis not present

## 2017-01-15 LAB — TSH: TSH: 3.02 (ref <=5.90)

## 2017-01-18 ENCOUNTER — Encounter: Payer: Self-pay | Admitting: Internal Medicine

## 2017-01-21 ENCOUNTER — Other Ambulatory Visit: Payer: Self-pay | Admitting: Nurse Practitioner

## 2017-01-30 ENCOUNTER — Encounter: Payer: Self-pay | Admitting: Adult Health

## 2017-01-30 ENCOUNTER — Non-Acute Institutional Stay (SKILLED_NURSING_FACILITY): Payer: Medicare Other | Admitting: Adult Health

## 2017-01-30 DIAGNOSIS — D509 Iron deficiency anemia, unspecified: Secondary | ICD-10-CM | POA: Diagnosis not present

## 2017-01-30 DIAGNOSIS — R1312 Dysphagia, oropharyngeal phase: Secondary | ICD-10-CM

## 2017-01-30 DIAGNOSIS — I10 Essential (primary) hypertension: Secondary | ICD-10-CM

## 2017-01-30 DIAGNOSIS — E1122 Type 2 diabetes mellitus with diabetic chronic kidney disease: Secondary | ICD-10-CM

## 2017-01-30 DIAGNOSIS — F0281 Dementia in other diseases classified elsewhere with behavioral disturbance: Secondary | ICD-10-CM

## 2017-01-30 DIAGNOSIS — F02818 Dementia in other diseases classified elsewhere, unspecified severity, with other behavioral disturbance: Secondary | ICD-10-CM

## 2017-01-30 DIAGNOSIS — N184 Chronic kidney disease, stage 4 (severe): Secondary | ICD-10-CM

## 2017-01-30 DIAGNOSIS — R634 Abnormal weight loss: Secondary | ICD-10-CM | POA: Diagnosis not present

## 2017-01-30 DIAGNOSIS — G301 Alzheimer's disease with late onset: Secondary | ICD-10-CM

## 2017-01-30 NOTE — Progress Notes (Signed)
Patient ID: [name], male   DOB: 1922/07/19, 83 y.o.   MRN: [id number]   Location:   [facility]   Place of Service:  SNF (31) Provider:   Cindi Carbon, ANP [facility] [phone]   [name], DO  Patient Care Team: [name], DO as PCP - General (Geriatric Medicine) Raynelle Bring, MD as Consulting Physician (Urology) Jarome Matin, MD as Consulting Physician (Dermatology)  Extended Emergency Contact Information Primary Emergency Contact: Mcneff,[name] Address: [address]          [place], [place] [address] [name] of [place] Phone: [phone] Mobile Phone: [phone] Relation: Son  Code Status:  DNR Goals of care: Advanced Directive information Advanced Directives 01/30/2017  Does Patient Have a Medical Advance Directive? Yes  Type of Advance Directive Out of facility DNR (pink MOST or yellow form);[place];Living will  Does patient want to make changes to medical advance directive? -  Copy of [place] in Chart? Yes  Would patient like information on creating a medical advance directive? -  Pre-existing out of facility DNR order (yellow form or pink MOST form) Yellow form placed in chart (order not valid for inpatient use);Pink MOST form placed in chart (order not valid for inpatient use)     Chief Complaint  Patient presents with  . Medical Management of Chronic Issues    HPI:  83 y.o. male residing at [facility] skilled section, medical management of chronic diseases. He has a hx of dementia, DM II, OA, epilepsy, HTN, HLD, GERD, anemia, CKD, recurrent otitis, dysphagia, chronic bronchitis.  Weight loss: has lost 14 lbs since April, has decreased intake. TSH WNL  DM II on Tradjenta: A1C improved to 6.27 Aug 2016, Eye exam 10/23/16 [facility] 111  Dysphagia: on puree diet with NTL, tolerating well  Dementia: h/o sexually inappropriate behaviors and agitation  currently on depakote and paxil. Occasional inappropriate touches or comments per staff but no aggression/hitting/kicking. Dep level 01 Sep 2016  HTN: BP controlled with metoprolol  Anemia: currently on ferrous sulfate, h/o heme pos stool in 2015 but resolved with discontinuation of aspirin and starting prilosec Lab Results  Component Value Date   HGB 11.2 (A) 09/13/2016    Functional status: Harrel Lemon lift, incontinent   Past Medical History:  Diagnosis Date  . Actinic keratosis   . Alzheimer disease 09/13/2008   Qualifier: Diagnosis of  By: Nils Pyle CMA ([place]), Mearl Latin    . Anemia 2012  . Arthritis   . Cervicalgia 1998   degenereative disease of CS and foraminal narrowing at C5-6  . Coronary atherosclerosis of native coronary artery 2002   60% stenois of the 1st diagonal of the LAD  . Deaf   . Diabetes mellitus 1997  . Epilepsy ([place]) 1943  . Gastric ulcer 2012  . Generalized osteoarthrosis, unspecified site   . Glaucoma   . H/O prostate cancer 2011   treated with radiation  . History of fall 2013  . Hyperlipidemia   . Hypertension 2008  . Macular degeneration (senile) of retina, unspecified   . Major depressive disorder, single episode, unspecified   . Osteoarthritis of knee 10/07/2013   Qualifier: Diagnosis of  By: Nils Pyle CMA (AAMA), Mearl Latin    . Other B-complex deficiencies 2012  . Personal history of colonic polyps 2012   adenomatous  . Reflux esophagitis 2002  . Type II or unspecified type diabetes mellitus with peripheral circulatory disorders, not stated as uncontrolled(250.70)  05/24/2009   Qualifier: Diagnosis of  By: Caryl Comes, MD, Remus Blake   . Unspecified venous (peripheral) insufficiency   . Unspecified vitamin D deficiency 2010   Past Surgical History:  Procedure Laterality Date  . APPENDECTOMY  1947  . CARPAL TUNNEL RELEASE Bilateral 2005  . COLONOSCOPY  10/22/10   multiple adenomatous polyps  . MYRINGOTOMY Right   . TONSILLECTOMY  1930    Allergies    Allergen Reactions  . Aspirin   . Nsaids     Allergies as of 01/30/2017      Reactions   Aspirin    Nsaids       Medication List       Accurate as of 01/30/17 10:39 AM. Always use your most recent med list.          acetaminophen 325 MG tablet Commonly known as:  TYLENOL Take 650 mg by mouth 3 (three) times daily.   carboxymethylcellulose 0.5 % Soln Commonly known as:  REFRESH PLUS 2 drops 2 (two) times daily.   DELSYM 30 MG/5ML liquid Generic drug:  dextromethorphan Take 5 mLs by mouth 2 (two) times daily as needed for cough.   divalproex 125 MG capsule Commonly known as:  DEPAKOTE SPRINKLE Take 125 mg by mouth 2 (two) times daily.   ferrous sulfate 325 (65 FE) MG tablet Take 325 mg by mouth daily with breakfast.   ipratropium-albuterol 0.5-2.5 (3) MG/3ML Soln Commonly known as:  DUONEB Take 3 mLs by nebulization 2 (two) times daily.   latanoprost 0.005 % ophthalmic solution Commonly known as:  XALATAN Place 1 drop into the right eye at bedtime.   linagliptin 5 MG Tabs tablet Commonly known as:  TRADJENTA Take 5 mg by mouth daily.   loratadine 10 MG tablet Commonly known as:  CLARITIN Take 10 mg by mouth daily.   metoprolol succinate 50 MG 24 hr tablet Commonly known as:  TOPROL-XL Take 50 mg by mouth daily.   NAMENDA XR 28 MG Cp24 24 hr capsule Generic drug:  memantine Take by mouth.   nystatin cream Commonly known as:  MYCOSTATIN Apply 1 application topically daily. groin   OCUSOFT EYELID CLEANSING Pads Apply 1 application topically daily.   omeprazole 20 MG capsule Commonly known as:  PRILOSEC Take 20 mg by mouth 2 (two) times daily before a meal.   PARoxetine 20 MG tablet Commonly known as:  PAXIL Take 20 mg by mouth daily.   polyethylene glycol packet Commonly known as:  MIRALAX / GLYCOLAX Take 17 g by mouth daily.   VITAMIN B 12 PO Take 1,000 mcg by mouth daily.   Vitamin D 2000 units Caps Take by mouth.       Review of  Systems  Unable to perform ROS: Dementia    Immunization History  Administered Date(s) Administered  . Influenza Inj Mdck Quad Pf 05/09/2016  . Influenza-Unspecified 05/21/2013, 04/26/2014, 05/04/2015  . Pneumococcal Conjugate-13 09/12/2016  . Pneumococcal Polysaccharide-23 10/12/2010  . Td 10/11/2011  . Zoster 10/17/2010   Pertinent  Health Maintenance Due  Topic Date Due  . URINE MICROALBUMIN  08/05/1933  . INFLUENZA VACCINE  02/19/2017  . HEMOGLOBIN A1C  03/13/2017  . FOOT EXAM  10/10/2017  . OPHTHALMOLOGY EXAM  10/23/2017  . PNA vac Low Risk Adult  Completed   Fall Risk  11/13/2016 03/16/2015 11/07/2014  Falls in the past year? No No No  Risk for fall due to : - History of fall(s) Impaired balance/gait;Impaired mobility;Medication side effect;History of  fall(s)   Wt Readings from Last 3 Encounters:  01/30/17 182 lb 14.4 oz (83 kg)  01/14/17 184 lb 6.4 oz (83.6 kg)  12/03/16 194 lb (88 kg)   Vitals:   01/30/17 1038  BP: (!) 147/79  Pulse: 60  Resp: 20  Temp: (!) 97.1 F (36.2 C)     Physical Exam  Constitutional: No distress.  HENT:  Head: Normocephalic and atraumatic.  Right Ear: External ear normal. No drainage, swelling or tenderness. Decreased hearing is noted.  Left Ear: External ear and ear canal normal. No drainage or swelling. Decreased hearing is noted.  Nose: Nose normal. Right sinus exhibits no maxillary sinus tenderness and no frontal sinus tenderness. Left sinus exhibits no maxillary sinus tenderness and no frontal sinus tenderness.  Eyes: Pupils are equal, round, and reactive to light. Conjunctivae are normal. Right eye exhibits no discharge. Left eye exhibits no discharge.  Neck: No JVD present.  Cardiovascular: Normal rate and regular rhythm.   No murmur heard. Pulmonary/Chest: Effort normal and breath sounds normal. No respiratory distress. He has no wheezes.  Abdominal: Soft. Bowel sounds are normal.  Lymphadenopathy:    He has no cervical  adenopathy.  Neurological: He is alert.  Oriented to self only  Skin: Skin is warm and dry. He is not diaphoretic.  Psychiatric: He has a normal mood and affect.  Nursing note and vitals reviewed.   Labs reviewed:  Recent Labs  03/15/16 09/13/16  NA 142 144  K 5.2 5.4*  BUN 26* 25*  CREATININE 1.6* 1.6*    Recent Labs  03/15/16  AST 18  ALT 13  ALKPHOS 66    Recent Labs  03/15/16 09/13/16  WBC 10.6 7.9  HGB 11.1* 11.2*  HCT 33* 35*  PLT 218 207   Lab Results  Component Value Date   TSH 3.02 01/15/2017   Lab Results  Component Value Date   HGBA1C 6.6 09/13/2016   Lab Results  Component Value Date   CHOL 150 09/17/2013   HDL 57 09/17/2013   LDLCALC 72 09/17/2013   TRIG 107 09/17/2013   Lab Results  Component Value Date   ALT 13 03/15/2016   AST 18 03/15/2016   ALKPHOS 66 03/15/2016   BILITOT 0.40 01/13/2013  09/13/16: Depakote level 11   Significant Diagnostic Results in last 30 days:  No results found.  Assessment/Plan  1. Type 2 diabetes mellitus with stage 4 chronic kidney disease, without long-term current use of insulin (HCC) Continue current regimen but may d/c tradjenta if A1C decreasing, also has decreased intake  2. Essential hypertension Controlled goal <150/90 Continue metoprolol XL 50 mg qd  3. Oropharyngeal dysphagia Asp prec Continue modified diet Has most form indicating no feeding tubes or hospitalizations  4. Late onset Alzheimer's disease with behavioral disturbance Continue Depakote and Paxil as there are still signs of sexually inappropriate behaviors but his aggression has overall improved  5. Weight loss Check labs Most likely due to progressive dementia and dysphagia (no other symptoms at this point) May add nutritional supplement if it continues to drop  6. Iron deficiency anemia, unspecified iron deficiency anemia type Continue ferrous sulfate 325 mg qd and monitor CBC No aggressive work up due to  age/debility/goals of care   LABS: CMP A1C CBC   Cindi Carbon, [place] [phone]

## 2017-01-31 DIAGNOSIS — D649 Anemia, unspecified: Secondary | ICD-10-CM | POA: Diagnosis not present

## 2017-01-31 DIAGNOSIS — E119 Type 2 diabetes mellitus without complications: Secondary | ICD-10-CM | POA: Diagnosis not present

## 2017-01-31 DIAGNOSIS — R634 Abnormal weight loss: Secondary | ICD-10-CM | POA: Diagnosis not present

## 2017-01-31 LAB — CBC AND DIFFERENTIAL
HCT: 33 — AB (ref 41–53)
Hemoglobin: 11.1 — AB (ref 13.5–17.5)
Platelets: 188 (ref 150–399)
WBC: 7

## 2017-01-31 LAB — HEPATIC FUNCTION PANEL
ALK PHOS: 68 (ref 25–125)
ALT: 6 — AB (ref 10–40)
AST: 12 — AB (ref 14–40)
BILIRUBIN, TOTAL: 0.4

## 2017-01-31 LAB — BASIC METABOLIC PANEL
BUN: 25 — AB (ref 4–21)
Creatinine: 1.7 — AB (ref 0.6–1.3)
GLUCOSE: 118
POTASSIUM: 5.2 (ref 3.4–5.3)
SODIUM: 141 (ref 137–147)

## 2017-01-31 LAB — HEMOGLOBIN A1C: Hemoglobin A1C: 6.1

## 2017-02-24 ENCOUNTER — Non-Acute Institutional Stay (SKILLED_NURSING_FACILITY): Payer: Medicare Other | Admitting: Adult Health

## 2017-02-24 DIAGNOSIS — F0281 Dementia in other diseases classified elsewhere with behavioral disturbance: Secondary | ICD-10-CM

## 2017-02-24 DIAGNOSIS — R634 Abnormal weight loss: Secondary | ICD-10-CM | POA: Diagnosis not present

## 2017-02-24 DIAGNOSIS — K219 Gastro-esophageal reflux disease without esophagitis: Secondary | ICD-10-CM | POA: Diagnosis not present

## 2017-02-24 DIAGNOSIS — N183 Chronic kidney disease, stage 3 unspecified: Secondary | ICD-10-CM

## 2017-02-24 DIAGNOSIS — G301 Alzheimer's disease with late onset: Secondary | ICD-10-CM

## 2017-02-24 DIAGNOSIS — I10 Essential (primary) hypertension: Secondary | ICD-10-CM

## 2017-02-24 DIAGNOSIS — R1312 Dysphagia, oropharyngeal phase: Secondary | ICD-10-CM | POA: Diagnosis not present

## 2017-02-24 DIAGNOSIS — E538 Deficiency of other specified B group vitamins: Secondary | ICD-10-CM | POA: Diagnosis not present

## 2017-02-24 DIAGNOSIS — E1122 Type 2 diabetes mellitus with diabetic chronic kidney disease: Secondary | ICD-10-CM | POA: Diagnosis not present

## 2017-02-24 NOTE — Progress Notes (Signed)
Patient ID: [name], male   DOB: 1921-09-14, 83 y.o.   MRN: [id number]   Location:   [facility]   Place of Service:  SNF (31) Provider:   Cindi Carbon, ANP [facility] [phone]   [name], DO  Patient Care Team: [name], DO as PCP - General (Geriatric Medicine) Raynelle Bring, MD as Consulting Physician (Urology) Jarome Matin, MD as Consulting Physician (Dermatology)  Extended Emergency Contact Information Primary Emergency Contact: Mccarrell,[name] Address: [address]          [place],  [address] [name] of [place] Phone: [phone] Mobile Phone: [phone] Relation: Son  Code Status:  DNR Goals of care: Advanced Directive information Advanced Directives 02/24/2017  Does Patient Have a Medical Advance Directive? Yes  Type of Advance Directive Out of facility DNR (pink MOST or yellow form);[place];Living will  Does patient want to make changes to medical advance directive? -  Copy of [place] in Chart? Yes  Would patient like information on creating a medical advance directive? -  Pre-existing out of facility DNR order (yellow form or pink MOST form) Yellow form placed in chart (order not valid for inpatient use);Pink MOST form placed in chart (order not valid for inpatient use)     Chief Complaint  Patient presents with  . Medical Management of Chronic Issues    HPI:  83 y.o. male residing at [facility] skilled section, medical management of chronic diseases. He has a hx of dementia, DM II, OA, epilepsy, HTN, HLD, GERD, anemia, CKD, recurrent otitis, dysphagia, chronic bronchitis.  Weight loss: has lost 15 lbs since April.  TSH WNL  DM II off tradjenta A1C improved to 6.19 January 2017, Eye exam 10/23/16   Dysphagia: on puree diet with NTL, tolerating well  Dementia: h/o sexually inappropriate behaviors and agitation currently on depakote and paxil. Dep  level 01 Sep 2016 Note on 7/23 showed that he was agitated and refused a bath  HTN: BP controlled with metoprolol  CKD III 30 ml/min, unchanged.  Lab Results  Component Value Date   BUN 25 (A) 01/31/2017   Lab Results  Component Value Date   CREATININE 1.7 (A) 01/31/2017   GERD: hx of heme pos stools, anemia, gastritis (noted on endo in 2012)  Currently on prilosec 20 mg BID, no symptoms  OA: takes tylenol 650 mg TID, no reports of pain  Constipation: takes miralax 17 grams daily, no reports of difficulty with Bms  Functional status: Hoyer lift, incontinent   Past Medical History:  Diagnosis Date  . Actinic keratosis   . Alzheimer disease 09/13/2008   Qualifier: Diagnosis of  By: Nils Pyle CMA ([place]), Mearl Latin    . Anemia 2012  . Arthritis   . Cervicalgia 1998   degenereative disease of CS and foraminal narrowing at C5-6  . Coronary atherosclerosis of native coronary artery 2002   60% stenois of the 1st diagonal of the LAD  . Deaf   . Diabetes mellitus 1997  . Epilepsy ([place]) 1943  . Gastric ulcer 2012  . Generalized osteoarthrosis, unspecified site   . Glaucoma   . H/O prostate cancer 2011   treated with radiation  . History of fall 2013  . Hyperlipidemia   . Hypertension 2008  . Macular degeneration (senile) of retina, unspecified   . Major depressive disorder, single episode, unspecified   . Osteoarthritis of knee 10/07/2013   Qualifier: Diagnosis of  By:  [place] CMA ([place]), Mearl Latin    . Other B-complex deficiencies 2012  . Personal history of colonic polyps 2012   adenomatous  . Reflux esophagitis 2002  . Type II or unspecified type diabetes mellitus with peripheral circulatory disorders, not stated as uncontrolled(250.70) 05/24/2009   Qualifier: Diagnosis of  By: Caryl Comes, MD, Remus Blake   . Unspecified venous (peripheral) insufficiency   . Unspecified vitamin D deficiency 2010   Past Surgical History:  Procedure Laterality Date  . APPENDECTOMY  1947  .  CARPAL TUNNEL RELEASE Bilateral 2005  . COLONOSCOPY  10/22/10   multiple adenomatous polyps  . MYRINGOTOMY Right   . TONSILLECTOMY  1930    Allergies  Allergen Reactions  . Aspirin   . Nsaids     Allergies as of 02/24/2017      Reactions   Aspirin    Nsaids       Medication List       Accurate as of 02/24/17  1:05 PM. Always use your most recent med list.          acetaminophen 325 MG tablet Commonly known as:  TYLENOL Take 650 mg by mouth 3 (three) times daily.   carboxymethylcellulose 0.5 % Soln Commonly known as:  REFRESH PLUS 2 drops 2 (two) times daily.   DELSYM 30 MG/5ML liquid Generic drug:  dextromethorphan Take 5 mLs by mouth 2 (two) times daily as needed for cough.   divalproex 125 MG capsule Commonly known as:  DEPAKOTE SPRINKLE Take 125 mg by mouth 2 (two) times daily.   ferrous sulfate 325 (65 FE) MG tablet Take 325 mg by mouth daily with breakfast.   ipratropium-albuterol 0.5-2.5 (3) MG/3ML Soln Commonly known as:  DUONEB Take 3 mLs by nebulization 2 (two) times daily.   latanoprost 0.005 % ophthalmic solution Commonly known as:  XALATAN Place 1 drop into the right eye at bedtime.   loratadine 10 MG tablet Commonly known as:  CLARITIN Take 10 mg by mouth daily.   metoprolol succinate 50 MG 24 hr tablet Commonly known as:  TOPROL-XL Take 50 mg by mouth daily.   NAMENDA XR 28 MG Cp24 24 hr capsule Generic drug:  memantine Take by mouth.   nystatin cream Commonly known as:  MYCOSTATIN Apply 1 application topically daily. groin   OCUSOFT EYELID CLEANSING Pads Apply 1 application topically daily.   omeprazole 20 MG capsule Commonly known as:  PRILOSEC Take 20 mg by mouth 2 (two) times daily before a meal.   PARoxetine 20 MG tablet Commonly known as:  PAXIL Take 20 mg by mouth daily.   polyethylene glycol packet Commonly known as:  MIRALAX / GLYCOLAX Take 17 g by mouth daily.   VITAMIN B 12 PO Take 1,000 mcg by mouth daily.     Vitamin D 2000 units Caps Take by mouth.       Review of Systems  Unable to perform ROS: Dementia    Immunization History  Administered Date(s) Administered  . Influenza Inj Mdck Quad Pf 05/09/2016  . Influenza-Unspecified 05/21/2013, 04/26/2014, 05/04/2015  . Pneumococcal Conjugate-13 09/12/2016  . Pneumococcal Polysaccharide-23 10/12/2010  . Td 10/11/2011  . Zoster 10/17/2010   Pertinent  Health Maintenance Due  Topic Date Due  . URINE MICROALBUMIN  08/05/1933  . INFLUENZA VACCINE  02/19/2017  . HEMOGLOBIN A1C  03/13/2017  . FOOT EXAM  10/10/2017  . OPHTHALMOLOGY EXAM  10/23/2017  . PNA vac Low Risk Adult  Completed   Fall Risk  11/13/2016  03/16/2015 11/07/2014  Falls in the past year? No No No  Risk for fall due to : - History of fall(s) Impaired balance/gait;Impaired mobility;Medication side effect;History of fall(s)   Wt Readings from Last 3 Encounters:  02/24/17 181 lb (82.1 kg)  01/30/17 182 lb 14.4 oz (83 kg)  01/14/17 184 lb 6.4 oz (83.6 kg)   Vitals:   02/24/17 1303  BP: (!) 148/83  Pulse: 66  Resp: 20  Temp: (!) 97.1 F (36.2 C)     Physical Exam  Constitutional: No distress.  HENT:  Head: Normocephalic and atraumatic.  Right Ear: External ear normal. No drainage, swelling or tenderness. Decreased hearing is noted.  Left Ear: External ear and ear canal normal. No drainage or swelling. Decreased hearing is noted.  Nose: Nose normal. Right sinus exhibits no maxillary sinus tenderness and no frontal sinus tenderness. Left sinus exhibits no maxillary sinus tenderness and no frontal sinus tenderness.  Eyes: Pupils are equal, round, and reactive to light. Conjunctivae are normal. Right eye exhibits no discharge. Left eye exhibits no discharge.  Neck: No JVD present.  Cardiovascular: Normal rate and regular rhythm.   No murmur heard. Pulmonary/Chest: Effort normal and breath sounds normal. No respiratory distress. He has no wheezes.  Abdominal: Soft. Bowel  sounds are normal.  Lymphadenopathy:    He has no cervical adenopathy.  Neurological: He is alert.  Oriented to self only  Skin: Skin is warm and dry. He is not diaphoretic.  Psychiatric: He has a normal mood and affect.  Nursing note and vitals reviewed.   Labs reviewed:  Recent Labs  03/15/16 09/13/16 01/31/17  NA 142 144 141  K 5.2 5.4* 5.2  BUN 26* 25* 25*  CREATININE 1.6* 1.6* 1.7*    Recent Labs  03/15/16 01/31/17  AST 18 12*  ALT 13 6*  ALKPHOS 66 68    Recent Labs  03/15/16 09/13/16 01/31/17  WBC 10.6 7.9 7.0  HGB 11.1* 11.2* 11.1*  HCT 33* 35* 33*  PLT 218 207 188   Lab Results  Component Value Date   TSH 3.02 01/15/2017   Lab Results  Component Value Date   HGBA1C 6.1 01/31/2017   Lab Results  Component Value Date   CHOL 150 09/17/2013   HDL 57 09/17/2013   LDLCALC 72 09/17/2013   TRIG 107 09/17/2013   Lab Results  Component Value Date   ALT 6 (A) 01/31/2017   AST 12 (A) 01/31/2017   ALKPHOS 68 01/31/2017   BILITOT 0.40 01/13/2013  09/13/16: Depakote level 11   Significant Diagnostic Results in last 30 days:  No results found.  Assessment/Plan  1. Gastroesophageal reflux disease without esophagitis Continue prilosec 20 mg BID  2. Vitamin B12 deficiency Continue Vitamin B12 1000 mcg qd  3. Late onset Alzheimer's disease with behavioral disturbance Progressive cognitive and functional losses consistent with AD Continue current medications Would not taper Depakote due to recent documented behavior  4. Oropharyngeal dysphagia Continue modified diet and asp prec  5. Essential hypertension Controlled Goal BP <150/90 Continue metoprolol 50 mg XL qd  6. Type 2 diabetes mellitus with stage 3 chronic kidney disease, without long-term current use of insulin (HCC) Off trajenta A1C at goal 6.1, should be <8% Most likely improved due to weight loss  7. CKD (chronic kidney disease) stage 3, GFR 30-59 ml/min Continue to periodically  monitor BMP and avoid nephrotoxic agents  8. Weight loss Continues to lose weight most likely due to dementia Would not work  up other causes due to goals of care and age If this continues could add supplement but he previously had been over weight     Cindi Carbon, [place] [phone]

## 2017-03-21 ENCOUNTER — Encounter: Payer: Self-pay | Admitting: Adult Health

## 2017-03-21 ENCOUNTER — Non-Acute Institutional Stay (SKILLED_NURSING_FACILITY): Payer: Medicare Other | Admitting: Adult Health

## 2017-03-21 DIAGNOSIS — D509 Iron deficiency anemia, unspecified: Secondary | ICD-10-CM

## 2017-03-21 DIAGNOSIS — I951 Orthostatic hypotension: Secondary | ICD-10-CM

## 2017-03-21 NOTE — Progress Notes (Signed)
Location:  Occupational psychologist of Service:  SNF (31) Provider:   Cindi Carbon, ANP [place] [phone]  [name], DO  Patient Care Team: [name], DO as PCP - General (Geriatric Medicine) Raynelle Bring, MD as Consulting Physician (Urology) Jarome Matin, MD as Consulting Physician (Dermatology)  Extended Emergency Contact Information Primary Emergency Contact: Vanaken,[name] Address: [address]          [place], [place] [address] [name] of [place] Phone: [phone] Mobile Phone: [phone] Relation: Son  Code Status:  DNR Goals of care: Advanced Directive information Advanced Directives 02/24/2017  Does Patient Have a Medical Advance Directive? Yes  Type of Advance Directive Out of facility DNR (pink MOST or yellow form);[place];Living will  Does patient want to make changes to medical advance directive? -  Copy of [place] in Chart? Yes  Would patient like information on creating a medical advance directive? -  Pre-existing out of facility DNR order (yellow form or pink MOST form) Yellow form placed in chart (order not valid for inpatient use);Pink MOST form placed in chart (order not valid for inpatient use)     Chief Complaint  Patient presents with  . Acute Visit    low BP    HPI:  Pt is a 83 y.o. male seen today for an acute visit for low BP of 82/50.  The staff report he is in his usual state of health and ate an adequate breakfast. He has advanced dementia and can not contribute to the hx.   I asked the nurse to put him in bed and recheck the BP and it returned at 120/70, pulse 52.  He has been taking metoprolol and apparently his BP has been soft for a few days.  No fever, cough,chills, sputum production etc.  He has a hx of heme pos stools and IDA (not worked up due to his age and dementia but responded to PPI therapy and iron supplementation) No reports  of bloody stools. Lab Results  Component Value Date   HGB 11.1 (A) 01/31/2017     Past Medical History:  Diagnosis Date  . Actinic keratosis   . Alzheimer disease 09/13/2008   Qualifier: Diagnosis of  By: Nils Pyle CMA ([place]), Mearl Latin    . Anemia 2012  . Arthritis   . Cervicalgia 1998   degenereative disease of CS and foraminal narrowing at C5-6  . Coronary atherosclerosis of native coronary artery 2002   60% stenois of the 1st diagonal of the LAD  . Deaf   . Diabetes mellitus 1997  . Epilepsy ([place]) 1943  . Gastric ulcer 2012  . Generalized osteoarthrosis, unspecified site   . Glaucoma   . H/O prostate cancer 2011   treated with radiation  . History of fall 2013  . Hyperlipidemia   . Hypertension 2008  . Macular degeneration (senile) of retina, unspecified   . Major depressive disorder, single episode, unspecified   . Osteoarthritis of knee 10/07/2013   Qualifier: Diagnosis of  By: Nils Pyle CMA (AAMA), Mearl Latin    . Other B-complex deficiencies 2012  . Personal history of colonic polyps 2012   adenomatous  . Reflux esophagitis 2002  . Type II or unspecified type diabetes mellitus with peripheral circulatory disorders, not stated as uncontrolled(250.70) 05/24/2009   Qualifier: Diagnosis of  By: Caryl Comes, MD, Remus Blake   . Unspecified venous (peripheral) insufficiency   . Unspecified vitamin D deficiency  2010   Past Surgical History:  Procedure Laterality Date  . APPENDECTOMY  1947  . CARPAL TUNNEL RELEASE Bilateral 2005  . COLONOSCOPY  10/22/10   multiple adenomatous polyps  . MYRINGOTOMY Right   . TONSILLECTOMY  1930    Allergies  Allergen Reactions  . Aspirin   . Nsaids     Outpatient Encounter Prescriptions as of 03/21/2017  Medication Sig  . acetaminophen (TYLENOL) 325 MG tablet Take 650 mg by mouth 3 (three) times daily.   . carboxymethylcellulose (REFRESH PLUS) 0.5 % SOLN 2 drops 2 (two) times daily.  . Cholecalciferol (VITAMIN D) 2000 UNITS CAPS Take by  mouth.  . Cyanocobalamin (VITAMIN B 12 PO) Take 1,000 mcg by mouth daily.  Marland Kitchen dextromethorphan (DELSYM) 30 MG/5ML liquid Take 5 mLs by mouth 2 (two) times daily as needed for cough.  . divalproex (DEPAKOTE SPRINKLE) 125 MG capsule Take 125 mg by mouth 2 (two) times daily.  . Eyelid Cleansers (OCUSOFT EYELID CLEANSING) PADS Apply 1 application topically daily.  . ferrous sulfate 325 (65 FE) MG tablet Take 325 mg by mouth daily with breakfast.  . ipratropium-albuterol (DUONEB) 0.5-2.5 (3) MG/3ML SOLN Take 3 mLs by nebulization 2 (two) times daily.  Marland Kitchen latanoprost (XALATAN) 0.005 % ophthalmic solution Place 1 drop into the right eye at bedtime.  Marland Kitchen loratadine (CLARITIN) 10 MG tablet Take 10 mg by mouth daily.   . Memantine HCl ER (NAMENDA XR) 28 MG CP24 Take by mouth.  . metoprolol (TOPROL-XL) 50 MG 24 hr tablet Take 50 mg by mouth daily.    Marland Kitchen nystatin cream (MYCOSTATIN) Apply 1 application topically daily. groin  . omeprazole (PRILOSEC) 20 MG capsule Take 20 mg by mouth 2 (two) times daily before a meal.  . PARoxetine (PAXIL) 20 MG tablet Take 20 mg by mouth daily.  . polyethylene glycol (MIRALAX / GLYCOLAX) packet Take 17 g by mouth daily.   No facility-administered encounter medications on file as of 03/21/2017.     Review of Systems  Unable to perform ROS: Dementia    Immunization History  Administered Date(s) Administered  . Influenza Inj Mdck Quad Pf 05/09/2016  . Influenza-Unspecified 05/21/2013, 04/26/2014, 05/04/2015  . Pneumococcal Conjugate-13 09/12/2016  . Pneumococcal Polysaccharide-23 10/12/2010  . Td 10/11/2011  . Zoster 10/17/2010   Pertinent  Health Maintenance Due  Topic Date Due  . URINE MICROALBUMIN  08/05/1931  . INFLUENZA VACCINE  02/19/2017  . HEMOGLOBIN A1C  08/03/2017  . FOOT EXAM  10/10/2017  . OPHTHALMOLOGY EXAM  10/23/2017  . PNA vac Low Risk Adult  Completed   Fall Risk  11/13/2016 03/16/2015 11/07/2014  Falls in the past year? No No No  Risk for fall due  to : - History of fall(s) Impaired balance/gait;Impaired mobility;Medication side effect;History of fall(s)   Functional Status Survey:    Vitals:   03/21/17 1140  BP: (!) 82/50  Pulse: (!) 52  Resp: 20  Temp: (!) 97.1 F (36.2 C)   There is no height or weight on file to calculate BMI. Physical Exam  Constitutional: He is oriented to person, place, and time. No distress.  HENT:  Head: Normocephalic and atraumatic.  Nose: Nose normal.  Mouth/Throat: Oropharynx is clear and moist. No oropharyngeal exudate.  Eyes: Pupils are equal, round, and reactive to light. Conjunctivae are normal. Right eye exhibits no discharge. Left eye exhibits no discharge.  Neck: Normal range of motion. Neck supple. No JVD present. No tracheal deviation present. No thyromegaly present.  Cardiovascular:  Normal rate and regular rhythm.   No murmur heard. Pulmonary/Chest: Effort normal and breath sounds normal. No respiratory distress. He has no wheezes.  Decreased bases  Abdominal: Soft. Bowel sounds are normal. He exhibits no distension. There is no tenderness.  Lymphadenopathy:    He has no cervical adenopathy.  Neurological: He is alert and oriented to person, place, and time. No cranial nerve deficit.  Skin: Skin is warm and dry. He is not diaphoretic. There is pallor.  Psychiatric: He has a normal mood and affect.    Labs reviewed:  Recent Labs  09/13/16 01/31/17  NA 144 141  K 5.4* 5.2  BUN 25* 25*  CREATININE 1.6* 1.7*    Recent Labs  01/31/17  AST 12*  ALT 6*  ALKPHOS 68    Recent Labs  09/13/16 01/31/17  WBC 7.9 7.0  HGB 11.2* 11.1*  HCT 35* 33*  PLT 207 188   Lab Results  Component Value Date   TSH 3.02 01/15/2017   Lab Results  Component Value Date   HGBA1C 6.1 01/31/2017   Lab Results  Component Value Date   CHOL 150 09/17/2013   HDL 57 09/17/2013   LDLCALC 72 09/17/2013   TRIG 107 09/17/2013    Significant Diagnostic Results in last 30 days:  No results  found.  Assessment/Plan  1)Orthostatic Hypotension  Resolved once in bed. Hold metoprolol, encourage fluid and monitor BP qshift for 72 hrs. If this reoccurs will check labs for hydration although given his dementia with agitation I am not sure he would leave an IV in place. His labs in July were unremarkable.   2) IDA H/H has been stable on iron supplementation No reports of dark or loose stools   Family/ staff Communication: discussed with nurse  Labs/tests ordered:  NA

## 2017-03-22 ENCOUNTER — Other Ambulatory Visit: Payer: Self-pay | Admitting: Nurse Practitioner

## 2017-03-25 DIAGNOSIS — R05 Cough: Secondary | ICD-10-CM | POA: Diagnosis not present

## 2017-03-31 ENCOUNTER — Non-Acute Institutional Stay (SKILLED_NURSING_FACILITY): Payer: Medicare Other | Admitting: Adult Health

## 2017-03-31 ENCOUNTER — Encounter: Payer: Self-pay | Admitting: Adult Health

## 2017-03-31 DIAGNOSIS — H1033 Unspecified acute conjunctivitis, bilateral: Secondary | ICD-10-CM | POA: Diagnosis not present

## 2017-03-31 DIAGNOSIS — F0281 Dementia in other diseases classified elsewhere with behavioral disturbance: Secondary | ICD-10-CM

## 2017-03-31 DIAGNOSIS — Z79899 Other long term (current) drug therapy: Secondary | ICD-10-CM | POA: Diagnosis not present

## 2017-03-31 DIAGNOSIS — G301 Alzheimer's disease with late onset: Secondary | ICD-10-CM

## 2017-03-31 DIAGNOSIS — E785 Hyperlipidemia, unspecified: Secondary | ICD-10-CM | POA: Diagnosis not present

## 2017-03-31 DIAGNOSIS — F02818 Dementia in other diseases classified elsewhere, unspecified severity, with other behavioral disturbance: Secondary | ICD-10-CM

## 2017-03-31 DIAGNOSIS — R569 Unspecified convulsions: Secondary | ICD-10-CM | POA: Diagnosis not present

## 2017-03-31 NOTE — Progress Notes (Signed)
Location:  Occupational psychologist of Service:  SNF (31) Provider:   Cindi Carbon, ANP [place] [phone]   Dale Curry, DO  Patient Care Team: Dale Curry, DO as PCP - General (Geriatric Medicine) Dale Bring, MD as Consulting Physician (Urology) Dale Matin, MD as Consulting Physician (Dermatology)  Extended Emergency Contact Information Primary Emergency Contact: [name],[name] Address: [address]          [place], [place] [address] [name] of [place] Phone: [phone] Mobile Phone: [phone] Relation: Son  Code Status:  DNR Goals of care: Advanced Directive information Advanced Directives 02/24/2017  Does Patient Have a Medical Advance Directive? Yes  Type of Advance Directive Out of facility DNR (pink MOST or yellow form);[place];Living will  Does patient want to make changes to medical advance directive? -  Copy of [place] in Chart? Yes  Would patient like information on creating a medical advance directive? -  Pre-existing out of facility DNR order (yellow form or pink MOST form) Yellow form placed in chart (order not valid for inpatient use);Pink MOST form placed in chart (order not valid for inpatient use)     Chief Complaint  Patient presents with  . Acute Visit    eye drainage    HPI:  Pt is a 83 y.o. male seen today for an acute visit for eye drainage to both eyes, with redness and reports of pain. [name] has advanced dementia and is not able to participate in the hx. He did not respond to questions about pain but did not appear to be in pain. He slept throughout my visit and swatted at me when I attempted to examine him. VS have been stable.    Past Medical History:  Diagnosis Date  . Actinic keratosis   . Alzheimer disease 09/13/2008   Qualifier: Diagnosis of  By: Nils Pyle CMA ([place]), Mearl Latin    . Anemia 2012  . Arthritis   . Cervicalgia 1998   degenereative disease of CS and foraminal narrowing at C5-6  . Coronary atherosclerosis of native coronary artery 2002   60% stenois of the 1st diagonal of the LAD  . Deaf   . Diabetes mellitus 1997  . Epilepsy ([place]) 1943  . Gastric ulcer 2012  . Generalized osteoarthrosis, unspecified site   . Glaucoma   . H/O prostate cancer 2011   treated with radiation  . History of fall 2013  . Hyperlipidemia   . Hypertension 2008  . Macular degeneration (senile) of retina, unspecified   . Major depressive disorder, single episode, unspecified   . Osteoarthritis of knee 10/07/2013   Qualifier: Diagnosis of  By: Nils Pyle CMA (AAMA), Mearl Latin    . Other B-complex deficiencies 2012  . Personal history of colonic polyps 2012   adenomatous  . Reflux esophagitis 2002  . Type II or unspecified type diabetes mellitus with peripheral circulatory disorders, not stated as uncontrolled(250.70) 05/24/2009   Qualifier: Diagnosis of  By: Caryl Comes, MD, Remus Blake   . Unspecified venous (peripheral) insufficiency   . Unspecified vitamin D deficiency 2010   Past Surgical History:  Procedure Laterality Date  . APPENDECTOMY  1947  . CARPAL TUNNEL RELEASE Bilateral 2005  . COLONOSCOPY  10/22/10   multiple adenomatous polyps  . MYRINGOTOMY Right   . TONSILLECTOMY  1930    Allergies  Allergen Reactions  . Aspirin   . Nsaids     Outpatient Encounter Prescriptions as  of 03/31/2017  Medication Sig  . acetaminophen (TYLENOL) 325 MG tablet Take 650 mg by mouth 3 (three) times daily.   . carboxymethylcellulose (REFRESH PLUS) 0.5 % SOLN 2 drops 2 (two) times daily.  . Cholecalciferol (VITAMIN D) 2000 UNITS CAPS Take by mouth.  . Cyanocobalamin (VITAMIN B 12 PO) Take 1,000 mcg by mouth daily.  Marland Kitchen dextromethorphan (DELSYM) 30 MG/5ML liquid Take 5 mLs by mouth 2 (two) times daily as needed for cough.  . divalproex (DEPAKOTE SPRINKLE) 125 MG capsule Take 125 mg by mouth 2 (two) times daily.  . Eyelid Cleansers  (OCUSOFT EYELID CLEANSING) PADS Apply 1 application topically daily.  . ferrous sulfate 325 (65 FE) MG tablet Take 325 mg by mouth daily with breakfast.  . ipratropium-albuterol (DUONEB) 0.5-2.5 (3) MG/3ML SOLN Take 3 mLs by nebulization 2 (two) times daily.  Marland Kitchen latanoprost (XALATAN) 0.005 % ophthalmic solution Place 1 drop into the right eye at bedtime.  Marland Kitchen loratadine (CLARITIN) 10 MG tablet Take 10 mg by mouth daily.   . Memantine HCl ER (NAMENDA XR) 28 MG CP24 Take by mouth.  . metoprolol (TOPROL-XL) 50 MG 24 hr tablet Take 50 mg by mouth daily.    Marland Kitchen nystatin cream (MYCOSTATIN) Apply 1 application topically daily. groin  . omeprazole (PRILOSEC) 20 MG capsule Take 20 mg by mouth 2 (two) times daily before a meal.  . PARoxetine (PAXIL) 20 MG tablet Take 20 mg by mouth daily.  . polyethylene glycol (MIRALAX / GLYCOLAX) packet Take 17 g by mouth daily.   No facility-administered encounter medications on file as of 03/31/2017.     Review of Systems  Unable to perform ROS: Dementia    Immunization History  Administered Date(s) Administered  . Influenza Inj Mdck Quad Pf 05/09/2016  . Influenza-Unspecified 05/21/2013, 04/26/2014, 05/04/2015  . Pneumococcal Conjugate-13 09/12/2016  . Pneumococcal Polysaccharide-23 10/12/2010  . Td 10/11/2011  . Zoster 10/17/2010   Pertinent  Health Maintenance Due  Topic Date Due  . URINE MICROALBUMIN  08/05/1931  . INFLUENZA VACCINE  02/19/2017  . HEMOGLOBIN A1C  08/03/2017  . FOOT EXAM  10/10/2017  . OPHTHALMOLOGY EXAM  10/23/2017  . PNA vac Low Risk Adult  Completed   Fall Risk  11/13/2016 03/16/2015 11/07/2014  Falls in the past year? No No No  Risk for fall due to : - History of fall(s) Impaired balance/gait;Impaired mobility;Medication side effect;History of fall(s)   Functional Status Survey:    Vitals:   03/31/17 1406  BP: 113/71  Pulse: 66  Resp: (!) 22  Temp: (!) 97.4 F (36.3 C)  SpO2: 91%   There is no height or weight on file to  calculate BMI. Physical Exam  Constitutional: No distress.  HENT:  Head: Normocephalic and atraumatic.  Nose: Nose normal.  Refused oral exam. Chronically perforated left ear drum without drainage or redness. Right ear WNL  Eyes: Conjunctivae are normal. Right eye exhibits discharge. Left eye exhibits discharge.  Swatted at me when I tried to check for pupil response  Skin: He is not diaphoretic.  Nursing note and vitals reviewed.   Labs reviewed:  Recent Labs  09/13/16 01/31/17  NA 144 141  K 5.4* 5.2  BUN 25* 25*  CREATININE 1.6* 1.7*    Recent Labs  01/31/17  AST 12*  ALT 6*  ALKPHOS 68    Recent Labs  09/13/16 01/31/17  WBC 7.9 7.0  HGB 11.2* 11.1*  HCT 35* 33*  PLT 207 188   Lab Results  Component Value Date   TSH 3.02 01/15/2017   Lab Results  Component Value Date   HGBA1C 6.1 01/31/2017   Lab Results  Component Value Date   CHOL 150 09/17/2013   HDL 57 09/17/2013   LDLCALC 72 09/17/2013   TRIG 107 09/17/2013    Significant Diagnostic Results in last 30 days:  No results found.  Assessment/Plan  1. Acute bacterial conjunctivitis of both eyes Erythromycin 0.5% 1/2 in to both eyes QID for 7 days   2. Late onset Alzheimer's disease with behavioral disturbance Combative during exam, would not taper depakote Needs depakote level and LFTs   Family/ staff Communication: discussed with staff  Labs/tests ordered: depakote level and lfts Thursday 9/13

## 2017-04-03 ENCOUNTER — Non-Acute Institutional Stay (SKILLED_NURSING_FACILITY): Payer: Medicare Other | Admitting: Adult Health

## 2017-04-03 DIAGNOSIS — M1A9XX1 Chronic gout, unspecified, with tophus (tophi): Secondary | ICD-10-CM | POA: Diagnosis not present

## 2017-04-03 LAB — HEPATIC FUNCTION PANEL
ALT: 8 — AB (ref 10–40)
AST: 12 — AB (ref 14–40)
Alkaline Phosphatase: 68 (ref 25–125)
Bilirubin, Direct: 0.2 (ref 0.01–0.4)
Bilirubin, Total: 0.2

## 2017-04-03 NOTE — Progress Notes (Signed)
Location:  Occupational psychologist of Service:  SNF (31) Provider:   Cindi Carbon, ANP [name]   [name], DO  Patient Care Team: [name], DO as PCP - General (Geriatric Medicine) Raynelle Bring, MD as Consulting Physician (Urology) Jarome Matin, MD as Consulting Physician (Dermatology)  Extended Emergency Contact Information Primary Emergency Contact: [name],[name] Address: [address]          [place], [place] [address] [name] of [place] Phone: [phone] Mobile Phone: [phone] Relation: Son  Code Status:  DNR Goals of care: Advanced Directive information Advanced Directives 02/24/2017  Does Patient Have a Medical Advance Directive? Yes  Type of Advance Directive Out of facility DNR (pink MOST or yellow form);[place];Living will  Does patient want to make changes to medical advance directive? -  Copy of [place] in Chart? Yes  Would patient like information on creating a medical advance directive? -  Pre-existing out of facility DNR order (yellow form or pink MOST form) Yellow form placed in chart (order not valid for inpatient use);Pink MOST form placed in chart (order not valid for inpatient use)     Chief Complaint  Patient presents with  . Acute Visit    right index finger pain/swelling    HPI:  Pt is a 83 y.o. male seen today for an acute visit for swelling and redness with pain to the right index finger. No fever or injury noted. No hx of gout.   Past Medical History:  Diagnosis Date  . Actinic keratosis   . Alzheimer disease 09/13/2008   Qualifier: Diagnosis of  By: Nils Pyle CMA ([place]), Mearl Latin    . Anemia 2012  . Arthritis   . Cervicalgia 1998   degenereative disease of CS and foraminal narrowing at C5-6  . Coronary atherosclerosis of native coronary artery 2002   60% stenois of the 1st diagonal of the LAD  . Deaf   . Diabetes mellitus  1997  . Epilepsy ([place]) 1943  . Gastric ulcer 2012  . Generalized osteoarthrosis, unspecified site   . Glaucoma   . H/O prostate cancer 2011   treated with radiation  . History of fall 2013  . Hyperlipidemia   . Hypertension 2008  . Macular degeneration (senile) of retina, unspecified   . Major depressive disorder, single episode, unspecified   . Osteoarthritis of knee 10/07/2013   Qualifier: Diagnosis of  By: Nils Pyle CMA (AAMA), Mearl Latin    . Other B-complex deficiencies 2012  . Personal history of colonic polyps 2012   adenomatous  . Reflux esophagitis 2002  . Type II or unspecified type diabetes mellitus with peripheral circulatory disorders, not stated as uncontrolled(250.70) 05/24/2009   Qualifier: Diagnosis of  By: Caryl Comes, MD, Remus Blake   . Unspecified venous (peripheral) insufficiency   . Unspecified vitamin D deficiency 2010   Past Surgical History:  Procedure Laterality Date  . APPENDECTOMY  1947  . CARPAL TUNNEL RELEASE Bilateral 2005  . COLONOSCOPY  10/22/10   multiple adenomatous polyps  . MYRINGOTOMY Right   . TONSILLECTOMY  1930    Allergies  Allergen Reactions  . Aspirin   . Nsaids     Outpatient Encounter Prescriptions as of 04/03/2017  Medication Sig  . acetaminophen (TYLENOL) 325 MG tablet Take 650 mg by mouth 3 (three) times daily.   . carboxymethylcellulose (REFRESH PLUS) 0.5 % SOLN 2 drops 2 (two) times daily.  . Cholecalciferol (  VITAMIN D) 2000 UNITS CAPS Take by mouth.  . Cyanocobalamin (VITAMIN B 12 PO) Take 1,000 mcg by mouth daily.  Marland Kitchen dextromethorphan (DELSYM) 30 MG/5ML liquid Take 5 mLs by mouth 2 (two) times daily as needed for cough.  . divalproex (DEPAKOTE SPRINKLE) 125 MG capsule Take 125 mg by mouth 2 (two) times daily.  . Eyelid Cleansers (OCUSOFT EYELID CLEANSING) PADS Apply 1 application topically daily.  . ferrous sulfate 325 (65 FE) MG tablet Take 325 mg by mouth daily with breakfast.  . ipratropium-albuterol (DUONEB) 0.5-2.5 (3)  MG/3ML SOLN Take 3 mLs by nebulization 2 (two) times daily.  Marland Kitchen latanoprost (XALATAN) 0.005 % ophthalmic solution Place 1 drop into the right eye at bedtime.  Marland Kitchen loratadine (CLARITIN) 10 MG tablet Take 10 mg by mouth daily.   . Memantine HCl ER (NAMENDA XR) 28 MG CP24 Take by mouth.  . metoprolol (TOPROL-XL) 50 MG 24 hr tablet Take 50 mg by mouth daily.    Marland Kitchen nystatin cream (MYCOSTATIN) Apply 1 application topically daily. groin  . omeprazole (PRILOSEC) 20 MG capsule Take 20 mg by mouth 2 (two) times daily before a meal.  . PARoxetine (PAXIL) 20 MG tablet Take 20 mg by mouth daily.  . polyethylene glycol (MIRALAX / GLYCOLAX) packet Take 17 g by mouth daily.   No facility-administered encounter medications on file as of 04/03/2017.     Review of Systems  Unable to perform ROS: Dementia    Immunization History  Administered Date(s) Administered  . Influenza Inj Mdck Quad Pf 05/09/2016  . Influenza-Unspecified 05/21/2013, 04/26/2014, 05/04/2015  . Pneumococcal Conjugate-13 09/12/2016  . Pneumococcal Polysaccharide-23 10/12/2010  . Td 10/11/2011  . Zoster 10/17/2010   Pertinent  Health Maintenance Due  Topic Date Due  . URINE MICROALBUMIN  08/05/1931  . INFLUENZA VACCINE  02/19/2017  . HEMOGLOBIN A1C  08/03/2017  . FOOT EXAM  10/10/2017  . OPHTHALMOLOGY EXAM  10/23/2017  . PNA vac Low Risk Adult  Completed   Fall Risk  11/13/2016 03/16/2015 11/07/2014  Falls in the past year? No No No  Risk for fall due to : - History of fall(s) Impaired balance/gait;Impaired mobility;Medication side effect;History of fall(s)   Functional Status Survey:    Vitals:   04/03/17 1035  BP: 123/68  Pulse: 72  Resp: 18  Temp: 97.8 F (36.6 C)  SpO2: 91%   There is no height or weight on file to calculate BMI. Physical Exam  Constitutional: No distress.  HENT:  Head: Normocephalic and atraumatic.  Neck: Normal range of motion. Neck supple. No JVD present. No tracheal deviation present. No  thyromegaly present.  Cardiovascular: Normal rate and regular rhythm.   No murmur heard. Pulmonary/Chest: Effort normal and breath sounds normal. No respiratory distress. He has no wheezes.  Abdominal: Soft. Bowel sounds are normal. He exhibits no distension. There is no tenderness.  Lymphadenopathy:    He has no cervical adenopathy.  Neurological: He is alert.  Skin: Skin is warm and dry. He is not diaphoretic. There is erythema (right index finger with swelling and pain on palpation. Tophi noted to DIP joint).  Psychiatric: He has a normal mood and affect.    Labs reviewed:  Recent Labs  09/13/16 01/31/17  NA 144 141  K 5.4* 5.2  BUN 25* 25*  CREATININE 1.6* 1.7*    Recent Labs  01/31/17  AST 12*  ALT 6*  ALKPHOS 68    Recent Labs  09/13/16 01/31/17  WBC 7.9 7.0  HGB  11.2* 11.1*  HCT 35* 33*  PLT 207 188   Lab Results  Component Value Date   TSH 3.02 01/15/2017   Lab Results  Component Value Date   HGBA1C 6.1 01/31/2017   Lab Results  Component Value Date   CHOL 150 09/17/2013   HDL 57 09/17/2013   LDLCALC 72 09/17/2013   TRIG 107 09/17/2013    Significant Diagnostic Results in last 30 days:  No results found.  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

## 2017-04-15 ENCOUNTER — Encounter: Payer: Self-pay | Admitting: Internal Medicine

## 2017-04-15 ENCOUNTER — Non-Acute Institutional Stay (SKILLED_NURSING_FACILITY): Payer: Medicare Other | Admitting: Internal Medicine

## 2017-04-15 DIAGNOSIS — G301 Alzheimer's disease with late onset: Secondary | ICD-10-CM | POA: Diagnosis not present

## 2017-04-15 DIAGNOSIS — N183 Chronic kidney disease, stage 3 unspecified: Secondary | ICD-10-CM

## 2017-04-15 DIAGNOSIS — I10 Essential (primary) hypertension: Secondary | ICD-10-CM

## 2017-04-15 DIAGNOSIS — R569 Unspecified convulsions: Secondary | ICD-10-CM | POA: Diagnosis not present

## 2017-04-15 DIAGNOSIS — M10049 Idiopathic gout, unspecified hand: Secondary | ICD-10-CM | POA: Diagnosis not present

## 2017-04-15 DIAGNOSIS — E1122 Type 2 diabetes mellitus with diabetic chronic kidney disease: Secondary | ICD-10-CM | POA: Diagnosis not present

## 2017-04-15 DIAGNOSIS — F02818 Dementia in other diseases classified elsewhere, unspecified severity, with other behavioral disturbance: Secondary | ICD-10-CM

## 2017-04-15 DIAGNOSIS — F0281 Dementia in other diseases classified elsewhere with behavioral disturbance: Secondary | ICD-10-CM

## 2017-04-15 DIAGNOSIS — K5901 Slow transit constipation: Secondary | ICD-10-CM

## 2017-04-15 NOTE — Progress Notes (Signed)
Patient ID: [name], male   DOB: 11/25/1921, 83 y.o.   MRN: [id number]  Location:  [facility] Room Number: 137 Place of Service:  SNF ([phone]) Provider:  [name], DO  Patient Care Team: [name], DO as PCP - General (Geriatric Medicine) Raynelle Bring, MD as Consulting Physician (Urology) Jarome Matin, MD as Consulting Physician (Dermatology)  Extended Emergency Contact Information Primary Emergency Contact: Byrd,Dale Address: [address]          [place], [name] [address] [name] of [place] Phone: [phone] Mobile Phone: [phone] Relation: Son  Code Status:  DNR Goals of care: Advanced Directive information Advanced Directives 04/15/2017  Does Patient Have a Medical Advance Directive? Yes  Type of Advance Directive Out of facility DNR (pink MOST or yellow form);[place];Living will  Does patient want to make changes to medical advance directive? -  Copy of [place] in Chart? Yes  Would patient like information on creating a medical advance directive? -  Pre-existing out of facility DNR order (yellow form or pink MOST form) Pink MOST form placed in chart (order not valid for inpatient use);Yellow form placed in chart (order not valid for inpatient use)   Chief Complaint  Patient presents with  . Medical Management of Chronic Issues    routine visit    HPI:  Pt is a 83 y.o. male seen today for medical management of chronic diseases.    Alzheimer's dementia with behaviors- Maintained on Namenda, Paxil and depakote, No recent behavior issues. [name] sleeps most of the time but did wake up for my exam. MMSE 9/30 unable to draw clock.  Functional status utilizes wheelchair for mobility, staff use hoyer lift, incontinent   Hypertension-Maintained on Toprol XL 50mg  daily, no more episodes of hypotension. BP appropriate for goals.   Constipation-Slow transit, on  Miralax daily and has been having regular bowel movements per staff.  Gout- Uric Acid level drawn on 9/14 elevated, Patient says that his finger hurts but points to finger that does not have the nodules. Finished course of prednisone and colchicine.    Type 2 DM- A1c 6.1 on 01/31/2017. No longer on tradjenta. Eye exam 10/23/16   Convulsions-No seizure activity noted. Maintained on Depakote 125 BID.  Past Medical History:  Diagnosis Date  . Actinic keratosis   . Alzheimer disease 09/13/2008   Qualifier: Diagnosis of  By: Nils Pyle CMA ([place]), [name]    . Anemia 2012  . Arthritis   . Cervicalgia 1998   degenereative disease of CS and foraminal narrowing at C5-6  . Coronary atherosclerosis of native coronary artery 2002   60% stenois of the 1st diagonal of the LAD  . Deaf   . Diabetes mellitus 1997  . Epilepsy ([place]) 1943  . Gastric ulcer 2012  . Generalized osteoarthrosis, unspecified site   . Glaucoma   . H/O prostate cancer 2011   treated with radiation  . History of fall 2013  . Hyperlipidemia   . Hypertension 2008  . Macular degeneration (senile) of retina, unspecified   . Major depressive disorder, single episode, unspecified   . Osteoarthritis of knee 10/07/2013   Qualifier: Diagnosis of  By: Nils Pyle CMA (AAMA), [name]    . Other B-complex deficiencies 2012  . Personal history of colonic polyps 2012   adenomatous  . Reflux esophagitis 2002  . Type II or unspecified type diabetes mellitus with peripheral circulatory disorders, not stated as uncontrolled(250.70)  05/24/2009   Qualifier: Diagnosis of  By: Caryl Comes, MD, [name]   . Unspecified venous (peripheral) insufficiency   . Unspecified vitamin D deficiency 2010   Past Surgical History:  Procedure Laterality Date  . APPENDECTOMY  1947  . CARPAL TUNNEL RELEASE Bilateral 2005  . COLONOSCOPY  10/22/10   multiple adenomatous polyps  . MYRINGOTOMY Right   . TONSILLECTOMY  1930    Allergies  Allergen Reactions  .  Aspirin   . Nsaids     Outpatient Encounter Prescriptions as of 04/15/2017  Medication Sig  . acetaminophen (TYLENOL) 325 MG tablet Take 650 mg by mouth 3 (three) times daily.   . carboxymethylcellulose (REFRESH PLUS) 0.5 % SOLN 2 drops 2 (two) times daily.  . Cholecalciferol (VITAMIN D) 2000 UNITS CAPS Take by mouth.  . Cyanocobalamin (VITAMIN B 12 PO) Take 1,000 mcg by mouth daily.  Marland Kitchen dextromethorphan (DELSYM) 30 MG/5ML liquid Take 5 mLs by mouth 2 (two) times daily as needed for cough.  . divalproex (DEPAKOTE SPRINKLE) 125 MG capsule Take 125 mg by mouth 2 (two) times daily.  . Eyelid Cleansers (OCUSOFT EYELID CLEANSING) PADS Apply 1 application topically daily.  . ferrous sulfate 325 (65 FE) MG tablet Take 325 mg by mouth daily with breakfast.  . ipratropium-albuterol (DUONEB) 0.5-2.5 (3) MG/3ML SOLN Take 3 mLs by nebulization 2 (two) times daily.  Marland Kitchen latanoprost (XALATAN) 0.005 % ophthalmic solution Place 1 drop into the right eye at bedtime.  Marland Kitchen loratadine (CLARITIN) 10 MG tablet Take 10 mg by mouth daily.   . Memantine HCl ER (NAMENDA XR) 28 MG CP24 Take by mouth.  . nystatin cream (MYCOSTATIN) Apply 1 application topically daily. groin  . omeprazole (PRILOSEC) 20 MG capsule Take 20 mg by mouth 2 (two) times daily before a meal.  . PARoxetine (PAXIL) 20 MG tablet Take 20 mg by mouth daily.  . polyethylene glycol (MIRALAX / GLYCOLAX) packet Take 17 g by mouth daily.  . [DISCONTINUED] metoprolol (TOPROL-XL) 50 MG 24 hr tablet Take 50 mg by mouth daily.     No facility-administered encounter medications on file as of 04/15/2017.     Review of Systems  Unable to perform ROS: Dementia (Nursing staff confirms)  Constitutional: Negative for activity change, chills and fever.  HENT: Negative for congestion.   Eyes: Negative for visual disturbance.  Respiratory: Negative for shortness of breath.   Cardiovascular: Negative for chest pain.  Gastrointestinal: Positive for constipation.  Negative for abdominal distention, abdominal pain and vomiting.  Genitourinary:       Incontinence   Musculoskeletal: Positive for joint swelling.  Neurological: Positive for weakness. Negative for dizziness.  Psychiatric/Behavioral: Positive for confusion. Negative for behavioral problems.    Immunization History  Administered Date(s) Administered  . Influenza Inj Mdck Quad Pf 05/09/2016  . Influenza-Unspecified 05/21/2013, 04/26/2014, 05/04/2015  . Pneumococcal Conjugate-13 09/12/2016  . Pneumococcal Polysaccharide-23 10/12/2010  . Td 10/11/2011  . Zoster 10/17/2010   Pertinent  Health Maintenance Due  Topic Date Due  . URINE MICROALBUMIN  08/05/1933  . INFLUENZA VACCINE  02/19/2017  . HEMOGLOBIN A1C  08/03/2017  . FOOT EXAM  10/10/2017  . OPHTHALMOLOGY EXAM  10/23/2017  . PNA vac Low Risk Adult  Completed   Fall Risk  11/13/2016 03/16/2015 11/07/2014  Falls in the past year? No No No  Risk for fall due to : - History of fall(s) Impaired balance/gait;Impaired mobility;Medication side effect;History of fall(s)   Functional Status Survey:    Vitals:  04/15/17 1122  BP: (!) 148/86  Pulse: (!) 57  Resp: 16  Temp: (!) 97.3 F (36.3 C)  TempSrc: Oral  SpO2: 91%  Weight: 180 lb (81.6 kg)   Body mass index is 29.05 kg/m. Physical Exam  Constitutional: He appears well-developed and well-nourished. He appears lethargic. He is cooperative. He is easily aroused.  HENT:  Head: Normocephalic and atraumatic.  Eyes: Pupils are equal, round, and reactive to light.  Cardiovascular: Regular rhythm.  Bradycardia present.   Abdominal: Soft. Normal appearance and bowel sounds are normal.  Musculoskeletal: He exhibits deformity (Right index finger with nodules to first joint).  Neurological: He is easily aroused. He appears lethargic. Gait (Uses wheelchair and hoyer lift) abnormal.  Arousable, but taking afternoon nap and easily fell back to sleep  Skin: Skin is warm and dry.    Psychiatric: Cognition and memory are impaired.    Labs reviewed:  Recent Labs  09/13/16 01/31/17  NA 144 141  K 5.4* 5.2  BUN 25* 25*  CREATININE 1.6* 1.7*    Recent Labs  01/31/17 04/03/17 0700  AST 12* 12*  ALT 6* 8*  ALKPHOS 68 68    Recent Labs  09/13/16 01/31/17  WBC 7.9 7.0  HGB 11.2* 11.1*  HCT 35* 33*  PLT 207 188   Lab Results  Component Value Date   TSH 3.02 01/15/2017   Lab Results  Component Value Date   HGBA1C 6.1 01/31/2017   Lab Results  Component Value Date   CHOL 150 09/17/2013   HDL 57 09/17/2013   LDLCALC 72 09/17/2013   TRIG 107 09/17/2013    Significant Diagnostic Results in last 30 days:  No results found.  Assessment/Plan 1. Essential hypertension Continue with Toprol XL, watch for episodes of hypotension  2. Type 2 diabetes mellitus with stage 3 chronic kidney disease, without long-term current use of insulin (HCC) Maintain current plan, managing with diet keeping in mind goals of care, avoiding labs but once a year  3. Idiopathic gout of hand, unspecified chronicity, unspecified laterality Finished prednisone dose, finger still with tophus but he denies pain in that joint and erythema and warmth better  4. Slow transit constipation Continue Miralax as ordered   5. Late onset Alzheimer's disease with behavioral disturbance Continue with Namenda, Paxil and depakote due to his previous sexually inappropriate behaviors at times   6. Convulsions, unspecified convulsion type ([place]) No Seizures lately -maintain on depakote  Family/ staff Communication: discussed with snf nursing  Labs/tests ordered:  No new  Ezrael Sam L. Jenissa Tyrell, D.O. [facility] [address], [place] [address] Cell Phone (Mon-Fri 8am-5pm):  [phone] On Call:  [phone] & follow prompts after 5pm & weekends Office Phone:  [phone] Office Fax:  [phone]

## 2017-05-09 ENCOUNTER — Non-Acute Institutional Stay (SKILLED_NURSING_FACILITY): Payer: Medicare Other | Admitting: Adult Health

## 2017-05-09 DIAGNOSIS — I10 Essential (primary) hypertension: Secondary | ICD-10-CM

## 2017-05-09 DIAGNOSIS — G301 Alzheimer's disease with late onset: Secondary | ICD-10-CM | POA: Diagnosis not present

## 2017-05-09 DIAGNOSIS — F0281 Dementia in other diseases classified elsewhere with behavioral disturbance: Secondary | ICD-10-CM | POA: Diagnosis not present

## 2017-05-09 DIAGNOSIS — E1122 Type 2 diabetes mellitus with diabetic chronic kidney disease: Secondary | ICD-10-CM

## 2017-05-09 DIAGNOSIS — J411 Mucopurulent chronic bronchitis: Secondary | ICD-10-CM | POA: Diagnosis not present

## 2017-05-09 DIAGNOSIS — M1A9XX1 Chronic gout, unspecified, with tophus (tophi): Secondary | ICD-10-CM | POA: Diagnosis not present

## 2017-05-09 DIAGNOSIS — R1312 Dysphagia, oropharyngeal phase: Secondary | ICD-10-CM | POA: Diagnosis not present

## 2017-05-09 DIAGNOSIS — F02818 Alzheimer's disease with late onset: Secondary | ICD-10-CM

## 2017-05-09 DIAGNOSIS — N183 Chronic kidney disease, stage 3 unspecified: Secondary | ICD-10-CM

## 2017-05-20 ENCOUNTER — Encounter: Payer: Self-pay | Admitting: Adult Health

## 2017-05-20 DIAGNOSIS — M1A9XX1 Chronic gout, unspecified, with tophus (tophi): Secondary | ICD-10-CM | POA: Insufficient documentation

## 2017-05-20 NOTE — Progress Notes (Signed)
Patient ID: [name], male   DOB: 1921-10-10, 83 y.o.   MRN: [id number]   Location:   [facility]   Place of Service:  SNF (31) Provider:   Cindi Carbon, ANP [facility] [phone]   [name], DO  Patient Care Team: [name], DO as PCP - General (Geriatric Medicine) Raynelle Bring, MD as Consulting Physician (Urology) Jarome Matin, MD as Consulting Physician (Dermatology)  Extended Emergency Contact Information Primary Emergency Contact: Heumann,[name] Address: [address]          [place],  [address] [name] of [place] Phone: [phone] Mobile Phone: [phone] Relation: Son  Code Status:  DNR Goals of care: Advanced Directive information Advanced Directives 05/20/2017  Does Patient Have a Medical Advance Directive? Yes  Type of Advance Directive Out of facility DNR (pink MOST or yellow form);[place];Living will  Does patient want to make changes to medical advance directive? -  Copy of [place] in Chart? Yes  Would patient like information on creating a medical advance directive? -  Pre-existing out of facility DNR order (yellow form or pink MOST form) Pink MOST form placed in chart (order not valid for inpatient use);Yellow form placed in chart (order not valid for inpatient use)     Chief Complaint  Patient presents with  . Medical Management of Chronic Issues    HPI:  83 y.o. male residing at [facility] skilled section, medical management of chronic diseases. He has a hx of dementia, DM II, OA, epilepsy, HTN, HLD, GERD, anemia, CKD, recurrent otitis, dysphagia, chronic bronchitis.  Gout: uric acid 8.8 during acute attack in Sept of 2018.  Improved pain to index finger with prednisone.    DM II off tradjenta A1C improved to 6.19 January 2017, Eye exam 10/23/16   Dysphagia: on puree diet with NTL  Chronic bronchitis: former smoker, has chronic cough with  sputum production. Normal 02 sats and no increased wob or fever at this time  Dementia: review of nursing notes indicate periods of sexually inappropriate behaviors and resistance to care MMSE 9/30  HTN: BP controlled off metoprolol due to low bp   Functional status: Harrel Lemon lift, incontinent   Past Medical History:  Diagnosis Date  . Actinic keratosis   . Alzheimer disease 09/13/2008   Qualifier: Diagnosis of  By: Nils Pyle CMA ([place]), Mearl Latin    . Anemia 2012  . Arthritis   . Cervicalgia 1998   degenereative disease of CS and foraminal narrowing at C5-6  . Coronary atherosclerosis of native coronary artery 2002   60% stenois of the 1st diagonal of the LAD  . Deaf   . Diabetes mellitus 1997  . Epilepsy ([place]) 1943  . Gastric ulcer 2012  . Generalized osteoarthrosis, unspecified site   . Glaucoma   . H/O prostate cancer 2011   treated with radiation  . History of fall 2013  . Hyperlipidemia   . Hypertension 2008  . Macular degeneration (senile) of retina, unspecified   . Major depressive disorder, single episode, unspecified   . Osteoarthritis of knee 10/07/2013   Qualifier: Diagnosis of  By: Nils Pyle CMA (AAMA), Mearl Latin    . Other B-complex deficiencies 2012  . Personal history of colonic polyps 2012   adenomatous  . Reflux esophagitis 2002  . Type II or unspecified type diabetes mellitus with peripheral circulatory disorders, not stated as uncontrolled(250.70) 05/24/2009   Qualifier: Diagnosis of  By: Caryl Comes, MD, Caro Hight  [place]   . Unspecified venous (peripheral) insufficiency   . Unspecified vitamin D deficiency 2010   Past Surgical History:  Procedure Laterality Date  . APPENDECTOMY  1947  . CARPAL TUNNEL RELEASE Bilateral 2005  . COLONOSCOPY  10/22/10   multiple adenomatous polyps  . MYRINGOTOMY Right   . TONSILLECTOMY  1930    Allergies  Allergen Reactions  . Aspirin   . Nsaids     Allergies as of 05/09/2017      Reactions   Aspirin    Nsaids         Medication List       Accurate as of 05/09/17 11:59 PM. Always use your most recent med list.          acetaminophen 325 MG tablet Commonly known as:  TYLENOL Take 650 mg by mouth 3 (three) times daily.   carboxymethylcellulose 0.5 % Soln Commonly known as:  REFRESH PLUS 2 drops 2 (two) times daily.   DELSYM 30 MG/5ML liquid Generic drug:  dextromethorphan Take 5 mLs by mouth 2 (two) times daily as needed for cough.   divalproex 125 MG capsule Commonly known as:  DEPAKOTE SPRINKLE Take 125 mg by mouth 2 (two) times daily.   ferrous sulfate 325 (65 FE) MG tablet Take 325 mg by mouth daily with breakfast.   ipratropium-albuterol 0.5-2.5 (3) MG/3ML Soln Commonly known as:  DUONEB Take 3 mLs by nebulization 2 (two) times daily.   latanoprost 0.005 % ophthalmic solution Commonly known as:  XALATAN Place 1 drop into the right eye at bedtime.   loratadine 10 MG tablet Commonly known as:  CLARITIN Take 10 mg by mouth daily.   NAMENDA XR 28 MG Cp24 24 hr capsule Generic drug:  memantine Take by mouth.   nystatin cream Commonly known as:  MYCOSTATIN Apply 1 application topically daily. groin   OCUSOFT EYELID CLEANSING Pads Apply 1 application topically daily.   omeprazole 20 MG capsule Commonly known as:  PRILOSEC Take 20 mg by mouth 2 (two) times daily before a meal.   PARoxetine 20 MG tablet Commonly known as:  PAXIL Take 20 mg by mouth daily.   polyethylene glycol packet Commonly known as:  MIRALAX / GLYCOLAX Take 17 g by mouth daily.   VITAMIN B 12 PO Take 1,000 mcg by mouth daily.   Vitamin D 2000 units Caps Take by mouth.       Review of Systems  Unable to perform ROS: Dementia    Immunization History  Administered Date(s) Administered  . Influenza Inj Mdck Quad Pf 05/09/2016  . Influenza-Unspecified 05/21/2013, 04/26/2014, 05/04/2015  . Pneumococcal Conjugate-13 09/12/2016  . Pneumococcal Polysaccharide-23 10/12/2010  . Td 10/11/2011  .  Zoster 10/17/2010   Pertinent  Health Maintenance Due  Topic Date Due  . URINE MICROALBUMIN  08/05/1933  . INFLUENZA VACCINE  02/19/2017  . HEMOGLOBIN A1C  08/03/2017  . FOOT EXAM  10/10/2017  . OPHTHALMOLOGY EXAM  10/23/2017  . PNA vac Low Risk Adult  Completed   Fall Risk  11/13/2016 03/16/2015 11/07/2014  Falls in the past year? No No No  Risk for fall due to : - History of fall(s) Impaired balance/gait;Impaired mobility;Medication side effect;History of fall(s)   Wt Readings from Last 3 Encounters:  05/20/17 180 lb (81.6 kg)  04/15/17 180 lb (81.6 kg)  02/24/17 181 lb (82.1 kg)   Vitals:   05/20/17 1039  BP: 137/80  Pulse: 66  Resp: 20  Temp: 98.8 F (37.1 C)  SpO2: 94%     Physical Exam  Constitutional: No distress.  HENT:  Head: Normocephalic and atraumatic.  Right Ear: External ear normal. No drainage, swelling or tenderness. Decreased hearing is noted.  Left Ear: External ear and ear canal normal. No drainage or swelling. Decreased hearing is noted.  Nose: Nose normal. Right sinus exhibits no maxillary sinus tenderness and no frontal sinus tenderness. Left sinus exhibits no maxillary sinus tenderness and no frontal sinus tenderness.  Eyes: Pupils are equal, round, and reactive to light. Conjunctivae are normal. Right eye exhibits no discharge. Left eye exhibits no discharge.  Neck: No JVD present.  Cardiovascular: Normal rate and regular rhythm.   No murmur heard. Pulmonary/Chest: Effort normal and breath sounds normal. No respiratory distress. He has no wheezes.  Abdominal: Soft. Bowel sounds are normal.  Lymphadenopathy:    He has no cervical adenopathy.  Neurological: He is alert.  Oriented to self only  Skin: Skin is warm and dry. He is not diaphoretic.  Psychiatric: He has a normal mood and affect.  Nursing note and vitals reviewed.   Labs reviewed:  Recent Labs  09/13/16 01/31/17  NA 144 141  K 5.4* 5.2  BUN 25* 25*  CREATININE 1.6* 1.7*     Recent Labs  01/31/17 04/03/17 0700  AST 12* 12*  ALT 6* 8*  ALKPHOS 68 68    Recent Labs  09/13/16 01/31/17  WBC 7.9 7.0  HGB 11.2* 11.1*  HCT 35* 33*  PLT 207 188   Lab Results  Component Value Date   TSH 3.02 01/15/2017   Lab Results  Component Value Date   HGBA1C 6.1 01/31/2017   Lab Results  Component Value Date   CHOL 150 09/17/2013   HDL 57 09/17/2013   LDLCALC 72 09/17/2013   TRIG 107 09/17/2013   Lab Results  Component Value Date   ALT 8 (A) 04/03/2017   AST 12 (A) 04/03/2017   ALKPHOS 68 04/03/2017   BILITOT 0.40 01/13/2013  09/13/16: Depakote level 11  Significant Diagnostic Results in last 30 days:  No results found.  Assessment/Plan  1. Tophaceous gout Start allopurinol 100 mg qd and f/u uric acid level ordered  2. Essential hypertension Controlled without medications Goal BP <150/90  3. Mucopurulent chronic bronchitis (HCC) Continue Duonebs BID and claritin Monitor for SOB and decreased 02 sats  4. Oropharyngeal dysphagia Continue modified diet and asp prec  5. Type 2 diabetes mellitus with stage 3 chronic kidney disease, without long-term current use of insulin (HCC) Controlled without medication A1C q 7mo  6. CKD (chronic kidney disease) stage 3, GFR 30-59 ml/min (HCC) Continue to periodically monitor BMP and avoid nephrotoxic agents  7. Late onset Alzheimer's disease with behavioral disturbance Progressive cognitive and functional decline c/w the dz Continues to be verbal and feed himself Continue Namenda  Continue depakote and paxil due to documented behaviors   Cindi Carbon, ANP [facility] [phone]

## 2017-06-02 ENCOUNTER — Non-Acute Institutional Stay (SKILLED_NURSING_FACILITY): Payer: Medicare Other | Admitting: Adult Health

## 2017-06-02 DIAGNOSIS — K5901 Slow transit constipation: Secondary | ICD-10-CM

## 2017-06-02 DIAGNOSIS — N183 Chronic kidney disease, stage 3 unspecified: Secondary | ICD-10-CM

## 2017-06-02 DIAGNOSIS — G301 Alzheimer's disease with late onset: Secondary | ICD-10-CM | POA: Diagnosis not present

## 2017-06-02 DIAGNOSIS — K219 Gastro-esophageal reflux disease without esophagitis: Secondary | ICD-10-CM

## 2017-06-02 DIAGNOSIS — J411 Mucopurulent chronic bronchitis: Secondary | ICD-10-CM

## 2017-06-02 DIAGNOSIS — H0100B Unspecified blepharitis left eye, upper and lower eyelids: Secondary | ICD-10-CM

## 2017-06-02 DIAGNOSIS — E559 Vitamin D deficiency, unspecified: Secondary | ICD-10-CM

## 2017-06-02 DIAGNOSIS — R1312 Dysphagia, oropharyngeal phase: Secondary | ICD-10-CM

## 2017-06-02 DIAGNOSIS — E1122 Type 2 diabetes mellitus with diabetic chronic kidney disease: Secondary | ICD-10-CM | POA: Diagnosis not present

## 2017-06-02 DIAGNOSIS — H0100A Unspecified blepharitis right eye, upper and lower eyelids: Secondary | ICD-10-CM

## 2017-06-02 DIAGNOSIS — E538 Deficiency of other specified B group vitamins: Secondary | ICD-10-CM

## 2017-06-02 DIAGNOSIS — F0281 Dementia in other diseases classified elsewhere with behavioral disturbance: Secondary | ICD-10-CM

## 2017-06-02 DIAGNOSIS — M170 Bilateral primary osteoarthritis of knee: Secondary | ICD-10-CM

## 2017-06-02 DIAGNOSIS — M1A9XX1 Chronic gout, unspecified, with tophus (tophi): Secondary | ICD-10-CM | POA: Diagnosis not present

## 2017-06-02 DIAGNOSIS — I10 Essential (primary) hypertension: Secondary | ICD-10-CM | POA: Diagnosis not present

## 2017-06-02 DIAGNOSIS — I2510 Atherosclerotic heart disease of native coronary artery without angina pectoris: Secondary | ICD-10-CM | POA: Diagnosis not present

## 2017-06-02 DIAGNOSIS — F02818 Dementia in other diseases classified elsewhere, unspecified severity, with other behavioral disturbance: Secondary | ICD-10-CM

## 2017-06-02 LAB — HM DIABETES FOOT EXAM

## 2017-06-02 NOTE — Assessment & Plan Note (Signed)
Controlled, off beta blocker due to low bp

## 2017-06-02 NOTE — Assessment & Plan Note (Signed)
He is not able to verbalize pain due to his advanced dementia. We will continue the scheduled tylenol for any added benefit.

## 2017-06-02 NOTE — Progress Notes (Signed)
Patient ID: [name], male   DOB: [birth year], 83 y.o.   MRN: [id number]   Location:   [facility]   Place of Service:  SNF (31) Provider:   Cindi Carbon, ANP [facility] [phone]   [name], DO  Patient Care Team: [name], DO as PCP - General (Geriatric Medicine) Raynelle Bring, MD as Consulting Physician (Urology) Jarome Matin, MD as Consulting Physician (Dermatology)  Extended Emergency Contact Information Primary Emergency Contact: Halder,[name] Address: [address]          [place], [place] [address] [name] of [place] Phone: [phone] Mobile Phone: [phone] Relation: Son  Code Status:  DNR Goals of care: Advanced Directive information Advanced Directives 05/20/2017  Does Patient Have a Medical Advance Directive? Yes  Type of Advance Directive Out of facility DNR (pink MOST or yellow form);[place];Living will  Does patient want to make changes to medical advance directive? -  Copy of [place] in Chart? Yes  Would patient like information on creating a medical advance directive? -  Pre-existing out of facility DNR order (yellow form or pink MOST form) Pink MOST form placed in chart (order not valid for inpatient use);Yellow form placed in chart (order not valid for inpatient use)     Chief Complaint  Patient presents with  . Annual Exam    HPI:  83 y.o. male residing at [facility] skilled section, medical management of chronic diseases. He has a hx of dementia, DM II, OA, HTN, HLD, GERD, anemia, CKD, recurrent otitis, dysphagia, chronic bronchitis.  Gout: uric acid 8.8 during acute attack in Sept of 2018.  Improved pain to index finger with prednisone. Started on Allopurinol, uric acid level due next week  DM II off tradjenta A1C improved to 6.19 January 2017, Eye exam 10/23/16  CBGS range 137-140  Dysphagia: on puree diet with NTL  Chronic bronchitis:  former smoker, has chronic cough with sputum production. Normal 02 sats and no increased wob or fever at this time  Dementia: He continues to have periods of agitation and resistance to care. He also has made sexually inappropriate comments and touching. He is now off Paxil but continues on Depakote. 09/13/16: Depakote level 11 04/03/17: Depakote level 8 MMSE 9/30 10/08/16  HTN: BP controlled off metoprolol due to low bp  Iron deficiency anemia: He has a hx of heme positive stools which improved with PPI therapy and off antiplatelets. No further work up was completed due to his age and dementia He currently takes one iron tab per day. Hgb 11.1 in July  Constipation: regular BMs with miralax daily  Functional status: Hoyer lift, incontinent   Past Medical History:  Diagnosis Date  . Actinic keratosis   . Alzheimer disease 09/13/2008   Qualifier: Diagnosis of  By: Nils Pyle CMA ([place]), Mearl Latin    . Anemia 2012  . Arthritis   . Cervicalgia 1998   degenereative disease of CS and foraminal narrowing at C5-6  . Coronary atherosclerosis of native coronary artery 2002   60% stenois of the 1st diagonal of the LAD  . Deaf   . Diabetes mellitus 1997  . Epilepsy ([place]) 1943  . Gastric ulcer 2012  . Generalized osteoarthrosis, unspecified site   . Glaucoma   . H/O prostate cancer 2011   treated with radiation  . History of fall 2013  . Hyperlipidemia   . Hypertension 2008  . Macular degeneration (senile) of retina, unspecified   .  Major depressive disorder, single episode, unspecified   . Osteoarthritis of knee 10/07/2013   Qualifier: Diagnosis of  By: Nils Pyle CMA (AAMA), Mearl Latin    . Other B-complex deficiencies 2012  . Personal history of colonic polyps 2012   adenomatous  . Reflux esophagitis 2002  . Type II or unspecified type diabetes mellitus with peripheral circulatory disorders, not stated as uncontrolled(250.70) 05/24/2009   Qualifier: Diagnosis of  By: Caryl Comes, MD, Remus Blake    . Unspecified venous (peripheral) insufficiency   . Unspecified vitamin D deficiency 2010   Past Surgical History:  Procedure Laterality Date  . APPENDECTOMY  1947  . CARPAL TUNNEL RELEASE Bilateral 2005  . COLONOSCOPY  10/22/10   multiple adenomatous polyps  . MYRINGOTOMY Right   . TONSILLECTOMY  1930    Allergies  Allergen Reactions  . Aspirin   . Nsaids     Allergies as of 06/02/2017      Reactions   Aspirin    Nsaids       Medication List        Accurate as of 06/02/17  1:32 PM. Always use your most recent med list.          acetaminophen 325 MG tablet Commonly known as:  TYLENOL Take 650 mg by mouth 3 (three) times daily.   allopurinol 100 MG tablet Commonly known as:  ZYLOPRIM Take 100 mg daily by mouth.   carboxymethylcellulose 0.5 % Soln Commonly known as:  REFRESH PLUS 2 drops 2 (two) times daily.   DELSYM 30 MG/5ML liquid Generic drug:  dextromethorphan Take 5 mLs by mouth 2 (two) times daily as needed for cough.   divalproex 125 MG capsule Commonly known as:  DEPAKOTE SPRINKLE Take 125 mg by mouth 2 (two) times daily.   ferrous sulfate 325 (65 FE) MG tablet Take 325 mg by mouth daily with breakfast.   ipratropium-albuterol 0.5-2.5 (3) MG/3ML Soln Commonly known as:  DUONEB Take 3 mLs by nebulization 2 (two) times daily.   latanoprost 0.005 % ophthalmic solution Commonly known as:  XALATAN Place 1 drop into the right eye at bedtime.   loratadine 10 MG tablet Commonly known as:  CLARITIN Take 10 mg by mouth daily.   NAMENDA XR 28 MG Cp24 24 hr capsule Generic drug:  memantine Take by mouth.   nystatin cream Commonly known as:  MYCOSTATIN Apply 1 application topically daily. groin   OCUSOFT EYELID CLEANSING Pads Apply 1 application topically daily.   omeprazole 20 MG capsule Commonly known as:  PRILOSEC Take 20 mg by mouth 2 (two) times daily before a meal.   polyethylene glycol packet Commonly known as:  MIRALAX /  GLYCOLAX Take 17 g by mouth daily.   VITAMIN B 12 PO Take 1,000 mcg by mouth daily.   Vitamin D 2000 units Caps Take by mouth.       Review of Systems  Unable to perform ROS: Dementia    Immunization History  Administered Date(s) Administered  . Influenza Inj Mdck Quad Pf 05/09/2016  . Influenza-Unspecified 05/21/2013, 04/26/2014, 05/04/2015  . Pneumococcal Conjugate-13 09/12/2016  . Pneumococcal Polysaccharide-23 10/12/2010  . Td 10/11/2011  . Zoster 10/17/2010   Pertinent  Health Maintenance Due  Topic Date Due  . URINE MICROALBUMIN  08/05/1931  . INFLUENZA VACCINE  02/19/2017  . HEMOGLOBIN A1C  08/03/2017  . FOOT EXAM  10/10/2017  . OPHTHALMOLOGY EXAM  10/23/2017  . PNA vac Low Risk Adult  Completed   Fall Risk  11/13/2016  03/16/2015 11/07/2014  Falls in the past year? No No No  Risk for fall due to : - History of fall(s) Impaired balance/gait;Impaired mobility;Medication side effect;History of fall(s)   Wt Readings from Last 3 Encounters:  06/02/17 180 lb (81.6 kg)  05/20/17 180 lb (81.6 kg)  04/15/17 180 lb (81.6 kg)   Vitals:   06/02/17 1326  BP: 120/72  Pulse: 64  Resp: 18  Temp: 97.6 F (36.4 C)  SpO2: 90%     Physical Exam  Constitutional: No distress.  HENT:  Head: Normocephalic and atraumatic.  Right Ear: External ear normal. No drainage, swelling or tenderness. Decreased hearing is noted.  Left Ear: External ear and ear canal normal. No drainage or swelling. Decreased hearing is noted.  Nose: Nose normal.  Attempted to hit me when I tried to look in his mouth and ears.   Eyes: Conjunctivae are normal. Pupils are equal, round, and reactive to light. Right eye exhibits no discharge. Left eye exhibits no discharge.  Neck: No JVD present.  Cardiovascular: Normal rate and regular rhythm.  No murmur heard. Pulmonary/Chest: Effort normal and breath sounds normal. No respiratory distress. He has no wheezes.  Abdominal: Soft. Bowel sounds are normal.   Lymphadenopathy:    He has no cervical adenopathy.  Neurological: He is alert.  Oriented to self only, not following commands  Skin: Skin is warm and dry. He is not diaphoretic.  Psychiatric:  agitated  Nursing note and vitals reviewed.   Labs reviewed: Recent Labs    09/13/16 01/31/17  NA 144 141  K 5.4* 5.2  BUN 25* 25*  CREATININE 1.6* 1.7*   Recent Labs    01/31/17 04/03/17 0700  AST 12* 12*  ALT 6* 8*  ALKPHOS 68 68   Recent Labs    09/13/16 01/31/17  WBC 7.9 7.0  HGB 11.2* 11.1*  HCT 35* 33*  PLT 207 188   Lab Results  Component Value Date   TSH 3.02 01/15/2017   Lab Results  Component Value Date   HGBA1C 6.1 01/31/2017   Lab Results  Component Value Date   CHOL 150 09/17/2013   HDL 57 09/17/2013   LDLCALC 72 09/17/2013   TRIG 107 09/17/2013   Lab Results  Component Value Date   ALT 8 (A) 04/03/2017   AST 12 (A) 04/03/2017   ALKPHOS 68 04/03/2017   BILITOT 0.40 01/13/2013  09/13/16: Depakote level 11 04/03/17: Depakote level 8  Significant Diagnostic Results in last 30 days:  No results found.  Assessment/Plan  Coronary atherosclerosis There is a noted hx of this but due to his age and dementia we are not monitoring for this issue or performing lipid testing.   Essential hypertension Controlled, off beta blocker due to low bp  Chronic bronchitis (HCC) Doing well with minimal cough Continue Duonebs BID  GERD (gastroesophageal reflux disease) Would continue PPI long term due to hx of heme pos stool and anemia  Dysphagia Modified diet and asp prec. No feeding tubes.   Type 2 diabetes mellitus with renal manifestations A1C due, goal would be <8% Not currently on medications.   Late onset Alzheimer's disease with behavioral disturbance More agitated today. Will continue depakote at 125 mg BID. If his behaviors are more consistent may consider adding back paxil or another agent.  Osteoarthritis He is not able to verbalize pain due  to his advanced dementia. We will continue the scheduled tylenol for any added benefit.   Tophaceous gout Continue allopurinol Uric  acid due  CKD (chronic kidney disease) stage 3, GFR 30-59 ml/min Continue to periodically monitor BMP and avoid nephrotoxic agents   Iron deficiency anemia Hx of heme positive stool but this was not worked up due to his age and debility.   Vitamin D deficiency Continue Vitamin D 2000 units qd  Vitamin B12 deficiency Continue B12 1000 mcg qd   Blepharitis of both eyes Continue Ocusoft scrubs to both eyes daily.  Continue Refresh 2 gtts to both eyes BID.  Glaucoma He no longer goes out to eye apts due to his advanced dementia and resistance to care. Continue lantanoprost 1 gtt to the right eye qhs.   Constipation Continue Miralax 17 grams qd  Labs due next week which include A1C, uric acid, and BMP  Cindi Carbon, [place] [phone]

## 2017-06-02 NOTE — Assessment & Plan Note (Signed)
Continue allopurinol Uric acid due

## 2017-06-02 NOTE — Assessment & Plan Note (Signed)
More agitated today. Will continue depakote at 125 mg BID. If his behaviors are more consistent may consider adding back paxil or another agent.

## 2017-06-02 NOTE — Assessment & Plan Note (Signed)
Would continue PPI long term due to hx of heme pos stool and anemia

## 2017-06-02 NOTE — Assessment & Plan Note (Signed)
Hx of heme positive stool but this was not worked up due to his age and debility.

## 2017-06-02 NOTE — Assessment & Plan Note (Signed)
Doing well with minimal cough Continue Duonebs BID

## 2017-06-02 NOTE — Assessment & Plan Note (Signed)
A1C due, goal would be <8% Not currently on medications.

## 2017-06-02 NOTE — Assessment & Plan Note (Signed)
There is a noted hx of this but due to his age and dementia we are not monitoring for this issue or performing lipid testing.

## 2017-06-02 NOTE — Assessment & Plan Note (Signed)
Modified diet and asp prec. No feeding tubes.

## 2017-06-02 NOTE — Assessment & Plan Note (Signed)
Continue to periodically monitor BMP and avoid nephrotoxic agents

## 2017-06-05 ENCOUNTER — Encounter: Payer: Self-pay | Admitting: Adult Health

## 2017-06-05 NOTE — Assessment & Plan Note (Signed)
Continue B12 1000 mcg qd

## 2017-06-05 NOTE — Assessment & Plan Note (Signed)
He no longer goes out to eye apts due to his advanced dementia and resistance to care. Continue lantanoprost 1 gtt to the right eye qhs.

## 2017-06-05 NOTE — Assessment & Plan Note (Signed)
Continue Ocusoft scrubs to both eyes daily.  Continue Refresh 2 gtts to both eyes BID.

## 2017-06-05 NOTE — Assessment & Plan Note (Signed)
Continue Miralax 17 grams qd 

## 2017-06-05 NOTE — Assessment & Plan Note (Signed)
Continue Vitamin D 2000 units qd

## 2017-06-09 DIAGNOSIS — E119 Type 2 diabetes mellitus without complications: Secondary | ICD-10-CM | POA: Diagnosis not present

## 2017-06-09 DIAGNOSIS — E785 Hyperlipidemia, unspecified: Secondary | ICD-10-CM | POA: Diagnosis not present

## 2017-06-09 DIAGNOSIS — D649 Anemia, unspecified: Secondary | ICD-10-CM | POA: Diagnosis not present

## 2017-06-09 LAB — HEMOGLOBIN A1C: HEMOGLOBIN A1C: 6.5

## 2017-06-09 LAB — BASIC METABOLIC PANEL
BUN: 27 — AB (ref 4–21)
CREATININE: 1.5 — AB (ref 0.6–1.3)
Glucose: 108
POTASSIUM: 5.6 — AB (ref 3.4–5.3)
SODIUM: 143 (ref 137–147)

## 2017-07-03 ENCOUNTER — Non-Acute Institutional Stay (SKILLED_NURSING_FACILITY): Payer: Medicare Other | Admitting: Adult Health

## 2017-07-03 ENCOUNTER — Encounter: Payer: Self-pay | Admitting: Adult Health

## 2017-07-03 DIAGNOSIS — N183 Chronic kidney disease, stage 3 unspecified: Secondary | ICD-10-CM

## 2017-07-03 DIAGNOSIS — F0281 Dementia in other diseases classified elsewhere with behavioral disturbance: Secondary | ICD-10-CM | POA: Diagnosis not present

## 2017-07-03 DIAGNOSIS — S22000A Wedge compression fracture of unspecified thoracic vertebra, initial encounter for closed fracture: Secondary | ICD-10-CM

## 2017-07-03 DIAGNOSIS — G301 Alzheimer's disease with late onset: Secondary | ICD-10-CM

## 2017-07-03 DIAGNOSIS — M1A9XX1 Chronic gout, unspecified, with tophus (tophi): Secondary | ICD-10-CM

## 2017-07-03 DIAGNOSIS — S32010A Wedge compression fracture of first lumbar vertebra, initial encounter for closed fracture: Secondary | ICD-10-CM | POA: Diagnosis not present

## 2017-07-03 DIAGNOSIS — E1122 Type 2 diabetes mellitus with diabetic chronic kidney disease: Secondary | ICD-10-CM | POA: Diagnosis not present

## 2017-07-03 DIAGNOSIS — M545 Low back pain: Secondary | ICD-10-CM | POA: Diagnosis not present

## 2017-07-03 DIAGNOSIS — M546 Pain in thoracic spine: Secondary | ICD-10-CM | POA: Diagnosis not present

## 2017-07-03 NOTE — Progress Notes (Signed)
Patient ID: [name], male   DOB: [birth year], 83 y.o.   MRN: [id number]   Location:   [facility]   Place of Service:  SNF (31) Provider:   Cindi Carbon, ANP [facility] [phone]   [name], DO  Patient Care Team: [name], DO as PCP - General (Geriatric Medicine) Raynelle Bring, MD as Consulting Physician (Urology) Jarome Matin, MD as Consulting Physician (Dermatology)  Extended Emergency Contact Information Primary Emergency Contact: Melone,[name] Address: [address]          [place], [place] [address] [name] of [place] Phone: [phone] Mobile Phone: [phone] Relation: Son  Code Status:  DNR Goals of care: Advanced Directive information Advanced Directives 05/20/2017  Does Patient Have a Medical Advance Directive? Yes  Type of Advance Directive Out of facility DNR (pink MOST or yellow form);[place];Living will  Does patient want to make changes to medical advance directive? -  Copy of [place] in Chart? Yes  Would patient like information on creating a medical advance directive? -  Pre-existing out of facility DNR order (yellow form or pink MOST form) Pink MOST form placed in chart (order not valid for inpatient use);Yellow form placed in chart (order not valid for inpatient use)     Chief Complaint  Patient presents with  . Medical Management of Chronic Issues    HPI:  83 y.o. male residing at [facility] skilled section, medical management of chronic diseases. He has a hx of dementia, DM II, OA, HTN, HLD, GERD, anemia, CKD, recurrent otitis, dysphagia,gout, and chronic bronchitis.  Thoracic and lumbar back pain: he woke up this morning with these symptoms and tylenol did not seem to help He can not elaborate further due to his dementia A thoracic and lumbar xray was ordered and showed an L1 anterior wedge fracture and a mild anterior wedge fx of T6  and T11, age undetermined  Gout: uric acid 8.8 during acute attack in Sept of 2018.  Uric acid 5.5 on 06/09/17 after starting allopurinol  DM II off tradjenta A1C 6.5 in Nov of 2018, [place] exam 10/23/16  CBG  fasting 167  Dementia: Most recent reports regarding his behavior are that he is pleasant but occasionally can resist care. He remains on depakote and paxil for sexually inappropriate behavior 09/13/16: Depakote level 11 04/03/17: Depakote level 8 MMSE 9/30 10/08/16  CKD III Lab Results  Component Value Date   BUN 27 (A) 06/09/2017   Lab Results  Component Value Date   CREATININE 1.5 (A) 06/09/2017    Functional status: Harrel Lemon lift, incontinent   Past Medical History:  Diagnosis Date  . Actinic keratosis   . Alzheimer disease 09/13/2008   Qualifier: Diagnosis of  By: Nils Pyle CMA ([place]), Mearl Latin    . Anemia 2012  . Arthritis   . Cervicalgia 1998   degenereative disease of CS and foraminal narrowing at C5-6  . Coronary atherosclerosis of native coronary artery 2002   60% stenois of the 1st diagonal of the LAD  . Deaf   . Diabetes mellitus 1997  . Epilepsy ([employer]) 1943  . Gastric ulcer 2012  . Generalized osteoarthrosis, unspecified site   . Glaucoma   . H/O prostate cancer 2011   treated with radiation  . History of fall 2013  . Hyperlipidemia   . Hypertension 2008  . Macular degeneration (senile) of retina, unspecified   . Major depressive disorder, single episode, unspecified   .  Osteoarthritis of knee 10/07/2013   Qualifier: Diagnosis of  By: Nils Pyle CMA (AAMA), Mearl Latin    . Other B-complex deficiencies 2012  . Personal history of colonic polyps 2012   adenomatous  . Reflux esophagitis 2002  . Type II or unspecified type diabetes mellitus with peripheral circulatory disorders, not stated as uncontrolled(250.70) 05/24/2009   Qualifier: Diagnosis of  By: Caryl Comes, MD, Remus Blake   . Unspecified venous (peripheral) insufficiency   . Unspecified vitamin D deficiency  2010   Past Surgical History:  Procedure Laterality Date  . APPENDECTOMY  1947  . CARPAL TUNNEL RELEASE Bilateral 2005  . COLONOSCOPY  10/22/10   multiple adenomatous polyps  . MYRINGOTOMY Right   . TONSILLECTOMY  1930    Allergies  Allergen Reactions  . Aspirin   . Nsaids     Allergies as of 07/03/2017      Reactions   Aspirin    Nsaids       Medication List        Accurate as of 07/03/17 11:43 AM. Always use your most recent med list.          acetaminophen 325 MG tablet Commonly known as:  TYLENOL Take 650 mg by mouth 3 (three) times daily.   allopurinol 100 MG tablet Commonly known as:  ZYLOPRIM Take 100 mg daily by mouth.   carboxymethylcellulose 0.5 % Soln Commonly known as:  REFRESH PLUS 2 drops 2 (two) times daily.   DELSYM 30 MG/5ML liquid Generic drug:  dextromethorphan Take 5 mLs by mouth 2 (two) times daily as needed for cough.   divalproex 125 MG capsule Commonly known as:  DEPAKOTE SPRINKLE Take 125 mg by mouth 2 (two) times daily.   ferrous sulfate 325 (65 FE) MG tablet Take 325 mg by mouth daily with breakfast.   ipratropium-albuterol 0.5-2.5 (3) MG/3ML Soln Commonly known as:  DUONEB Take 3 mLs by nebulization 2 (two) times daily.   latanoprost 0.005 % ophthalmic solution Commonly known as:  XALATAN Place 1 drop into the right eye at bedtime.   loratadine 10 MG tablet Commonly known as:  CLARITIN Take 10 mg by mouth daily.   NAMENDA XR 28 MG Cp24 24 hr capsule Generic drug:  memantine Take by mouth.   nystatin cream Commonly known as:  MYCOSTATIN Apply 1 application topically daily. groin   OCUSOFT EYELID CLEANSING Pads Apply 1 application topically daily.   omeprazole 20 MG capsule Commonly known as:  PRILOSEC Take 20 mg by mouth 2 (two) times daily before a meal.   polyethylene glycol packet Commonly known as:  MIRALAX / GLYCOLAX Take 17 g by mouth daily.   VITAMIN B 12 PO Take 1,000 mcg by mouth daily.     Vitamin D 2000 units Caps Take by mouth.       Review of Systems  Unable to perform ROS: Dementia    Immunization History  Administered Date(s) Administered  . Influenza Inj Mdck Quad Pf 05/09/2016  . Influenza-Unspecified 05/21/2013, 04/26/2014, 05/04/2015, 05/15/2017  . Pneumococcal Conjugate-13 09/12/2016  . Pneumococcal Polysaccharide-23 10/12/2010  . Td 10/11/2011  . Zoster 10/17/2010   Pertinent  Health Maintenance Due  Topic Date Due  . URINE MICROALBUMIN  08[birth year]05/1933  . HEMOGLOBIN A1C  08/03/2017  . OPHTHALMOLOGY EXAM  10/23/2017  . FOOT EXAM  06/02/2018  . INFLUENZA VACCINE  Completed  . PNA vac Low Risk Adult  Completed   Fall Risk  11/13/2016 03/16/2015 11/07/2014  Falls in the past year?  No No No  Risk for fall due to : - History of fall(s) Impaired balance/gait;Impaired mobility;Medication side effect;History of fall(s)   Wt Readings from Last 3 Encounters:  07/03/17 182 lb 12.8 oz (82.9 kg)  06/02/17 180 lb (81.6 kg)  05/20/17 180 lb (81.6 kg)   Vitals:   07/03/17 1137  BP: 136/77  Pulse: 74  Resp: 20  Temp: 97.7 F (36.5 C)  SpO2: 90%     Physical Exam  Constitutional: No distress.  HENT:  Head: Normocephalic and atraumatic.  Right Ear: No drainage, swelling or tenderness. Decreased hearing is noted.  Left Ear: Ear canal normal. No drainage or swelling. Decreased hearing is noted.  Eyes: Conjunctivae are normal. Pupils are equal, round, and reactive to light. Right eye exhibits no discharge. Left eye exhibits no discharge.  Neck: No JVD present.  Cardiovascular: Normal rate and regular rhythm.  No murmur heard. Pulmonary/Chest: Effort normal and breath sounds normal. No respiratory distress. He has no wheezes.  Abdominal: Soft. Bowel sounds are normal.  Musculoskeletal:  Slightly tender at the lumbar spine area, otherwise no tenderness or deformity. Due to resistance did not do SLR.   Lymphadenopathy:    He has no cervical adenopathy.   Neurological: He is alert.  Oriented to self only, not following commands  Skin: Skin is warm and dry. He is not diaphoretic.  Psychiatric: He has a normal mood and affect.  Nursing note and vitals reviewed.   Labs reviewed: Recent Labs    09/13/16 01/31/17 06/09/17  NA 144 141 143  K 5.4* 5.2 5.6*  BUN 25* 25* 27*  CREATININE 1.6* 1.7* 1.5*   Recent Labs    01/31/17 04/03/17 0700  AST 12* 12*  ALT 6* 8*  ALKPHOS 68 68   Recent Labs    09/13/16 01/31/17  WBC 7.9 7.0  HGB 11.2* 11.1*  HCT 35* 33*  PLT 207 188   Lab Results  Component Value Date   TSH 3.02 01/15/2017   Lab Results  Component Value Date   HGBA1C 6.5 06/09/2017   Lab Results  Component Value Date   CHOL 150 09/17/2013   HDL 57 09/17/2013   LDLCALC 72 09/17/2013   TRIG 107 09/17/2013   Lab Results  Component Value Date   ALT 8 (A) 04/03/2017   AST 12 (A) 04/03/2017   ALKPHOS 68 04/03/2017   BILITOT 0.40 01/13/2013  09/13/16: Depakote level 11 04/03/17: Depakote level 8  Significant Diagnostic Results in last 30 days:  No results found.  Assessment/Plan  Compression fracture of lumbar vertebra (HCC) Salonpas lidocaine patch to middle lower back area on in the am and off in the pm Percocet 1 p.o. BID prn pain, continue scheduled tylenol as well (has allergy to nsaids and hx of convulsions so did not try ultram)  Thoracic compression fracture ([place]) See above  Type 2 diabetes mellitus with renal manifestations Controlled. Goal A1C<8% Continue to monitor A1C q 6 months and fasting CBG periodically   CKD (chronic kidney disease) stage 3, GFR 30-59 ml/min Continue to periodically monitor BMP and avoid nephrotoxic agents   Tophaceous gout Continue allopurinol at 100 mg qd  Late onset Alzheimer's disease with behavioral disturbance Has progressive decline in cognition and function over time consistent with the dz but rather stable the past few months. Due to continued periods of  agitation would not titrate depakote or paxil at this time   Cindi Carbon, [place] [phone]

## 2017-07-04 DIAGNOSIS — S32000A Wedge compression fracture of unspecified lumbar vertebra, initial encounter for closed fracture: Secondary | ICD-10-CM | POA: Insufficient documentation

## 2017-07-04 DIAGNOSIS — S22000A Wedge compression fracture of unspecified thoracic vertebra, initial encounter for closed fracture: Secondary | ICD-10-CM | POA: Insufficient documentation

## 2017-07-04 NOTE — Assessment & Plan Note (Signed)
See above

## 2017-07-04 NOTE — Assessment & Plan Note (Signed)
Controlled. Goal A1C<8% Continue to monitor A1C q 6 months and fasting CBG periodically

## 2017-07-04 NOTE — Assessment & Plan Note (Signed)
Has progressive decline in cognition and function over time consistent with the dz but rather stable the past few months. Due to continued periods of agitation would not titrate depakote or paxil at this time

## 2017-07-04 NOTE — Assessment & Plan Note (Signed)
Salonpas lidocaine patch to middle lower back area on in the am and off in the pm Percocet 1 p.o. BID prn pain, continue scheduled tylenol as well (has allergy to nsaids and hx of convulsions so did not try ultram)

## 2017-07-04 NOTE — Assessment & Plan Note (Signed)
Continue to periodically monitor BMP and avoid nephrotoxic agents

## 2017-07-04 NOTE — Assessment & Plan Note (Signed)
Continue allopurinol at 100 mg qd

## 2017-07-28 ENCOUNTER — Non-Acute Institutional Stay (SKILLED_NURSING_FACILITY): Payer: Medicare Other | Admitting: Adult Health

## 2017-07-28 ENCOUNTER — Encounter: Payer: Self-pay | Admitting: Adult Health

## 2017-07-28 DIAGNOSIS — R1310 Dysphagia, unspecified: Secondary | ICD-10-CM

## 2017-07-28 DIAGNOSIS — J42 Unspecified chronic bronchitis: Secondary | ICD-10-CM | POA: Diagnosis not present

## 2017-07-28 DIAGNOSIS — J209 Acute bronchitis, unspecified: Secondary | ICD-10-CM | POA: Diagnosis not present

## 2017-07-28 DIAGNOSIS — D509 Iron deficiency anemia, unspecified: Secondary | ICD-10-CM | POA: Diagnosis not present

## 2017-07-28 DIAGNOSIS — S22000D Wedge compression fracture of unspecified thoracic vertebra, subsequent encounter for fracture with routine healing: Secondary | ICD-10-CM

## 2017-07-28 NOTE — Progress Notes (Signed)
Patient ID: [name], male   DOB: 02-Feb-1922, 84 y.o.   MRN: [id number]   Location:   [facility]   Place of Service:  SNF (31) Provider:   Cindi Carbon, ANP [facility] [phone]   [name], DO  Patient Care Team: [name], DO as PCP - General (Geriatric Medicine) Raynelle Bring, MD as Consulting Physician (Urology) Jarome Matin, MD as Consulting Physician (Dermatology)  Extended Emergency Contact Information Primary Emergency Contact: Mizner,[name] Address: [address]          [place], [place] [address] [name] of [place] Phone: [phone] Mobile Phone: [phone] Relation: Son  Code Status:  DNR Goals of care: Advanced Directive information Advanced Directives 05/20/2017  Does Patient Have a Medical Advance Directive? Yes  Type of Advance Directive Out of facility DNR (pink MOST or yellow form);[place];Living will  Does patient want to make changes to medical advance directive? -  Copy of [place] in Chart? Yes  Would patient like information on creating a medical advance directive? -  Pre-existing out of facility DNR order (yellow form or pink MOST form) Pink MOST form placed in chart (order not valid for inpatient use);Yellow form placed in chart (order not valid for inpatient use)     Chief Complaint  Patient presents with  . Medical Management of Chronic Issues    HPI:  84 y.o. male residing at [facility] skilled section, medical management of chronic diseases. He has a hx of dementia, DM II, OA, HTN, HLD, GERD, anemia, CKD, recurrent otitis, dysphagia,gout, and chronic bronchitis.  Cough: congested cough for the past 10 days with wheezing.  He has a hx of chronic bronchitis as well as dysphagia   Dysphagia: puree diet with NTL, no issues swallowing per staff or choking. See above   Thoracic and lumbar back pain: improved over the past month with  the salonpas patch. He as used percocet 1 x in the past week.  thoracic and lumbar xray was ordered and showed: an L1 anterior wedge fracture and a mild anterior wedge fx of T6 and T11, age undetermined  Hx of heme pos stools and anemia in 2015, no worked up due to age and debility. Improved after discontinuing aspirin Lab Results  Component Value Date   HGB 11.1 (A) 01/31/2017  Currently on prilosec and iron No symptoms.      Functional status: Hoyer lift, incontinent   Past Medical History:  Diagnosis Date  . Actinic keratosis   . Alzheimer disease 09/13/2008   Qualifier: Diagnosis of  By: Nils Pyle CMA ([place]), Mearl Latin    . Anemia 2012  . Arthritis   . Cervicalgia 1998   degenereative disease of CS and foraminal narrowing at C5-6  . Coronary atherosclerosis of native coronary artery 2002   60% stenois of the 1st diagonal of the LAD  . Deaf   . Diabetes mellitus 1997  . Epilepsy ([place]) 1943  . Gastric ulcer 2012  . Generalized osteoarthrosis, unspecified site   . Glaucoma   . H/O prostate cancer 2011   treated with radiation  . History of fall 2013  . Hyperlipidemia   . Hypertension 2008  . Macular degeneration (senile) of retina, unspecified   . Major depressive disorder, single episode, unspecified   . Osteoarthritis of knee 10/07/2013   Qualifier: Diagnosis of  By: Nils Pyle CMA (AAMA), Mearl Latin    . Other B-complex deficiencies 2012  . Personal history  of colonic polyps 2012   adenomatous  . Reflux esophagitis 2002  . Type II or unspecified type diabetes mellitus with peripheral circulatory disorders, not stated as uncontrolled(250.70) 05/24/2009   Qualifier: Diagnosis of  By: Caryl Comes, MD, Remus Blake   . Unspecified venous (peripheral) insufficiency   . Unspecified vitamin D deficiency 2010   Past Surgical History:  Procedure Laterality Date  . APPENDECTOMY  1947  . CARPAL TUNNEL RELEASE Bilateral 2005  . COLONOSCOPY  10/22/10   multiple adenomatous polyps  .  MYRINGOTOMY Right   . TONSILLECTOMY  1930    Allergies  Allergen Reactions  . Aspirin   . Nsaids     Allergies as of 07/28/2017      Reactions   Aspirin    Nsaids       Medication List        Accurate as of 07/28/17  3:00 PM. Always use your most recent med list.          acetaminophen 325 MG tablet Commonly known as:  TYLENOL Take 650 mg by mouth 3 (three) times daily.   allopurinol 100 MG tablet Commonly known as:  ZYLOPRIM Take 100 mg daily by mouth.   Calcium 600-400 MG-UNIT Chew Chew 1 tablet by mouth 2 (two) times daily.   carboxymethylcellulose 0.5 % Soln Commonly known as:  REFRESH PLUS 2 drops 2 (two) times daily.   DELSYM 30 MG/5ML liquid Generic drug:  dextromethorphan Take 5 mLs by mouth 2 (two) times daily as needed for cough.   divalproex 125 MG capsule Commonly known as:  DEPAKOTE SPRINKLE Take 125 mg by mouth 2 (two) times daily.   ferrous sulfate 325 (65 FE) MG tablet Take 325 mg by mouth daily with breakfast.   ipratropium-albuterol 0.5-2.5 (3) MG/3ML Soln Commonly known as:  DUONEB Take 3 mLs by nebulization 2 (two) times daily.   latanoprost 0.005 % ophthalmic solution Commonly known as:  XALATAN Place 1 drop into the right eye at bedtime.   lidocaine 5 % Commonly known as:  LIDODERM Place 1 patch onto the skin daily. Remove & Discard patch within 12 hours or as directed by MD Salonpas brand to low back   loratadine 10 MG tablet Commonly known as:  CLARITIN Take 10 mg by mouth daily.   NAMENDA XR 28 MG Cp24 24 hr capsule Generic drug:  memantine Take by mouth.   nystatin cream Commonly known as:  MYCOSTATIN Apply 1 application topically daily. groin   OCUSOFT EYELID CLEANSING Pads Apply 1 application topically daily.   omeprazole 20 MG capsule Commonly known as:  PRILOSEC Take 20 mg by mouth 2 (two) times daily before a meal.   oxyCODONE-acetaminophen 5-325 MG tablet Commonly known as:  PERCOCET/ROXICET Take 1 tablet  by mouth 2 (two) times daily as needed for severe pain.   polyethylene glycol packet Commonly known as:  MIRALAX / GLYCOLAX Take 17 g by mouth daily.   VITAMIN B 12 PO Take 1,000 mcg by mouth daily.   Vitamin D 2000 units Caps Take by mouth.       Review of Systems  Unable to perform ROS: Dementia    Immunization History  Administered Date(s) Administered  . Influenza Inj Mdck Quad Pf 05/09/2016  . Influenza-Unspecified 05/21/2013, 04/26/2014, 05/04/2015, 05/15/2017  . Pneumococcal Conjugate-13 09/12/2016  . Pneumococcal Polysaccharide-23 10/12/2010  . Td 10/11/2011  . Zoster 10/17/2010   Pertinent  Health Maintenance Due  Topic Date Due  . OPHTHALMOLOGY EXAM  10/23/2017  .  HEMOGLOBIN A1C  12/07/2017  . FOOT EXAM  06/02/2018  . INFLUENZA VACCINE  Completed  . PNA vac Low Risk Adult  Completed  . URINE MICROALBUMIN  Discontinued   Fall Risk  11/13/2016 03/16/2015 11/07/2014  Falls in the past year? No No No  Risk for fall due to : - History of fall(s) Impaired balance/gait;Impaired mobility;Medication side effect;History of fall(s)   Wt Readings from Last 3 Encounters:  07/28/17 172 lb 9.6 oz (78.3 kg)  07/03/17 182 lb 12.8 oz (82.9 kg)  06/02/17 180 lb (81.6 kg)   There were no vitals filed for this visit.   Physical Exam  Constitutional: No distress.  HENT:  Head: Normocephalic and atraumatic.  Right Ear: No drainage, swelling or tenderness. Decreased hearing is noted.  Left Ear: Ear canal normal. No drainage or swelling. Decreased hearing is noted.  Eyes: Conjunctivae are normal. Pupils are equal, round, and reactive to light. Right eye exhibits no discharge. Left eye exhibits no discharge.  Neck: No JVD present.  Cardiovascular: Normal rate and regular rhythm.  No murmur heard. Pulmonary/Chest: Effort normal and breath sounds normal. No respiratory distress. He has no wheezes.  Abdominal: Soft. Bowel sounds are normal.  Musculoskeletal:  Slightly tender at  the lumbar spine area, otherwise no tenderness or deformity. Due to resistance did not do SLR.   Lymphadenopathy:    He has no cervical adenopathy.  Neurological: He is alert.  Oriented to self only, not following commands  Skin: Skin is warm and dry. He is not diaphoretic.  Psychiatric: He has a normal mood and affect.  Nursing note and vitals reviewed.   Labs reviewed: Recent Labs    09/13/16 01/31/17 06/09/17  NA 144 141 143  K 5.4* 5.2 5.6*  BUN 25* 25* 27*  CREATININE 1.6* 1.7* 1.5*   Recent Labs    01/31/17 04/03/17 0700  AST 12* 12*  ALT 6* 8*  ALKPHOS 68 68   Recent Labs    09/13/16 01/31/17  WBC 7.9 7.0  HGB 11.2* 11.1*  HCT 35* 33*  PLT 207 188   Lab Results  Component Value Date   TSH 3.02 01/15/2017   Lab Results  Component Value Date   HGBA1C 6.5 06/09/2017   Lab Results  Component Value Date   CHOL 150 09/17/2013   HDL 57 09/17/2013   LDLCALC 72 09/17/2013   TRIG 107 09/17/2013   Lab Results  Component Value Date   ALT 8 (A) 04/03/2017   AST 12 (A) 04/03/2017   ALKPHOS 68 04/03/2017   BILITOT 0.40 01/13/2013  09/13/16: Depakote level 11 04/03/17: Depakote level 8  Significant Diagnostic Results in last 30 days:  No results found.  Assessment/Plan  Dysphagia Continue modified diet and asp prec   Thoracic compression fracture (HCC) Improved over the past month but seems to be aggravated by his cough. Continue salonpas patch and prn percocet for 1 more month and then re eval.  Iron deficiency anemia Due to hx if heme pos stools. Continue iron supplement and prilosec. Goals of care are comfort based.   Chronic bronchitis with acute exacerbation Symptoms persists for 10 days and seem to be aggravating his back pain Doxycycline 100 mg BID for 7 days with FLorastor 1 cap BID for 7 days Pulmicort 0.5 neb BID for 14 days  Cindi Carbon, Byng [phone]

## 2017-07-28 NOTE — Assessment & Plan Note (Addendum)
Continue modified diet and asp prec 

## 2017-07-28 NOTE — Assessment & Plan Note (Signed)
Due to hx if heme pos stools. Continue iron supplement and prilosec. Goals of care are comfort based.

## 2017-07-28 NOTE — Assessment & Plan Note (Signed)
Improved over the past month but seems to be aggravated by his cough. Continue salonpas patch and prn percocet for 1 more month and then re eval.

## 2017-09-02 ENCOUNTER — Encounter: Payer: Self-pay | Admitting: Internal Medicine

## 2017-09-02 ENCOUNTER — Non-Acute Institutional Stay (SKILLED_NURSING_FACILITY): Payer: Medicare Other | Admitting: Internal Medicine

## 2017-09-02 DIAGNOSIS — R1312 Dysphagia, oropharyngeal phase: Secondary | ICD-10-CM | POA: Diagnosis not present

## 2017-09-02 DIAGNOSIS — S22000D Wedge compression fracture of unspecified thoracic vertebra, subsequent encounter for fracture with routine healing: Secondary | ICD-10-CM

## 2017-09-02 DIAGNOSIS — J411 Mucopurulent chronic bronchitis: Secondary | ICD-10-CM

## 2017-09-02 DIAGNOSIS — F0281 Dementia in other diseases classified elsewhere with behavioral disturbance: Secondary | ICD-10-CM | POA: Diagnosis not present

## 2017-09-02 DIAGNOSIS — N184 Chronic kidney disease, stage 4 (severe): Secondary | ICD-10-CM | POA: Diagnosis not present

## 2017-09-02 DIAGNOSIS — E1122 Type 2 diabetes mellitus with diabetic chronic kidney disease: Secondary | ICD-10-CM | POA: Diagnosis not present

## 2017-09-02 DIAGNOSIS — G301 Alzheimer's disease with late onset: Secondary | ICD-10-CM | POA: Diagnosis not present

## 2017-09-02 DIAGNOSIS — F02818 Dementia in other diseases classified elsewhere, unspecified severity, with other behavioral disturbance: Secondary | ICD-10-CM

## 2017-09-02 DIAGNOSIS — R569 Unspecified convulsions: Secondary | ICD-10-CM | POA: Diagnosis not present

## 2017-09-02 NOTE — Progress Notes (Signed)
Patient ID: [name], male   DOB: 1922/06/14, 84 y.o.   MRN: [id number]  Location:  [facility] Room Number: [place] of Service:  Clinic ([phone]) Provider:   [name], DO  Patient Care Team: [name], DO as PCP - General (Geriatric Medicine) Raynelle Bring, MD as Consulting Physician (Urology) Jarome Matin, MD as Consulting Physician (Dermatology)  Extended Emergency Contact Information Primary Emergency Contact: Nuttall,[name] Address: [address]          [place], [place] [address] [name] of [place] Phone: [phone] Mobile Phone: [phone] Relation: Son  Code Status:  DNR Goals of care: Advanced Directive information Advanced Directives 09/02/2017  Does Patient Have a Medical Advance Directive? Yes  Type of Advance Directive Out of facility DNR (pink MOST or yellow form);[place];Living will  Does patient want to make changes to medical advance directive? No - Patient declined  Copy of [place] in Chart? Yes  Would patient like information on creating a medical advance directive? -  Pre-existing out of facility DNR order (yellow form or pink MOST form) Pink MOST form placed in chart (order not valid for inpatient use);Yellow form placed in chart (order not valid for inpatient use)     Chief Complaint  Patient presents with  . Medical Management of Chronic Issues    Routine Visit    HPI:  Pt is a 84 y.o. male seen today for medical management of chronic diseases.  He has a h/o dementia, DMII, OA, HTN, HL, GERD, anemia, CKD, dysphagia, gout and chronic bronchitis.  Congested cough improved since last visit and wheezing resolved.  He is doing "fine now with occasional cough".    Dysphagia ongoing with pureed diet with nectar thickened liquids.  No aspiration episodes noted.    No evidence of back pain recently either with salonpas patch (had compression fx).  Has not  been getting percocet recently.    When seen, he was resting in bed peacefully.   Past Medical History:  Diagnosis Date  . Actinic keratosis   . Alzheimer disease 09/13/2008   Qualifier: Diagnosis of  By: Nils Pyle CMA ([place]), Mearl Latin    . Anemia 2012  . Arthritis   . Cervicalgia 1998   degenereative disease of CS and foraminal narrowing at C5-6  . Coronary atherosclerosis of native coronary artery 2002   60% stenois of the 1st diagonal of the LAD  . Deaf   . Diabetes mellitus 1997  . Epilepsy ([place]) 1943  . Gastric ulcer 2012  . Generalized osteoarthrosis, unspecified site   . Glaucoma   . H/O prostate cancer 2011   treated with radiation  . History of fall 2013  . Hyperlipidemia   . Hypertension 2008  . Macular degeneration (senile) of retina, unspecified   . Major depressive disorder, single episode, unspecified   . Osteoarthritis of knee 10/07/2013   Qualifier: Diagnosis of  By: Nils Pyle CMA (AAMA), Mearl Latin    . Other B-complex deficiencies 2012  . Personal history of colonic polyps 2012   adenomatous  . Reflux esophagitis 2002  . Type II or unspecified type diabetes mellitus with peripheral circulatory disorders, not stated as uncontrolled(250.70) 05/24/2009   Qualifier: Diagnosis of  By: Caryl Comes, MD, Remus Blake   . Unspecified venous (peripheral) insufficiency   . Unspecified vitamin D deficiency 2010   Past Surgical History:  Procedure Laterality Date  . APPENDECTOMY  1947  . CARPAL TUNNEL  RELEASE Bilateral 2005  . COLONOSCOPY  10/22/10   multiple adenomatous polyps  . MYRINGOTOMY Right   . TONSILLECTOMY  1930    Allergies  Allergen Reactions  . Aspirin   . Nsaids     Outpatient Encounter Medications as of 09/02/2017  Medication Sig  . acetaminophen (TYLENOL) 325 MG tablet Take 650 mg by mouth 3 (three) times daily.   Marland Kitchen allopurinol (ZYLOPRIM) 100 MG tablet Take 100 mg daily by mouth.  . Calcium 600-400 MG-UNIT CHEW Chew 1 tablet by mouth 2 (two) times  daily.  . carboxymethylcellulose (REFRESH PLUS) 0.5 % SOLN 2 drops 2 (two) times daily.  . Cholecalciferol (VITAMIN D) 2000 UNITS CAPS Take by mouth.  . Cyanocobalamin (VITAMIN B 12 PO) Take 1,000 mcg by mouth daily.  Marland Kitchen dextromethorphan (DELSYM) 30 MG/5ML liquid Take 5 mLs by mouth 2 (two) times daily as needed for cough.  . divalproex (DEPAKOTE SPRINKLE) 125 MG capsule Take 125 mg by mouth 2 (two) times daily.  . Eyelid Cleansers (OCUSOFT EYELID CLEANSING) PADS Apply 1 application topically daily.  . ferrous sulfate 325 (65 FE) MG tablet Take 325 mg by mouth daily with breakfast.  . ipratropium-albuterol (DUONEB) 0.5-2.5 (3) MG/3ML SOLN Take 3 mLs by nebulization 2 (two) times daily.  Marland Kitchen latanoprost (XALATAN) 0.005 % ophthalmic solution Place 1 drop into the right eye at bedtime.  . lidocaine (LIDODERM) 5 % Place 1 patch onto the skin daily. Remove & Discard patch within 12 hours or as directed by MD Salonpas brand to low back  . loratadine (CLARITIN) 10 MG tablet Take 10 mg by mouth daily.   . Memantine HCl ER (NAMENDA XR) 28 MG CP24 Take by mouth.  . nystatin cream (MYCOSTATIN) Apply 1 application topically daily. groin  . omeprazole (PRILOSEC) 20 MG capsule Take 20 mg by mouth 2 (two) times daily before a meal.  . oxyCODONE-acetaminophen (PERCOCET/ROXICET) 5-325 MG tablet Take 1 tablet by mouth 2 (two) times daily as needed for severe pain.  . polyethylene glycol (MIRALAX / GLYCOLAX) packet Take 17 g by mouth daily.   No facility-administered encounter medications on file as of 09/02/2017.     Review of Systems  Constitutional: Negative for activity change, appetite change, chills and fever.  HENT: Negative for congestion.   Respiratory: Positive for cough. Negative for shortness of breath and wheezing.   Cardiovascular: Negative for chest pain and leg swelling.  Gastrointestinal: Positive for constipation. Negative for abdominal pain.       Does well on bowel regimen  Genitourinary:  Negative for dysuria.       Incontinent  Musculoskeletal: Positive for gait problem. Negative for back pain.  Skin: Negative for color change.  Neurological: Positive for weakness.  Psychiatric/Behavioral: Positive for confusion.       Dementia    Immunization History  Administered Date(s) Administered  . Influenza Inj Mdck Quad Pf 05/09/2016  . Influenza-Unspecified 05/21/2013, 04/26/2014, 05/04/2015, 05/15/2017  . Pneumococcal Conjugate-13 09/12/2016  . Pneumococcal Polysaccharide-23 10/12/2010  . Td 10/11/2011  . Zoster 10/17/2010   Pertinent  Health Maintenance Due  Topic Date Due  . OPHTHALMOLOGY EXAM  10/23/2017  . HEMOGLOBIN A1C  12/07/2017  . FOOT EXAM  06/02/2018  . INFLUENZA VACCINE  Completed  . PNA vac Low Risk Adult  Completed  . URINE MICROALBUMIN  Discontinued   Fall Risk  11/13/2016 03/16/2015 11/07/2014  Falls in the past year? No No No  Risk for fall due to : - History of fall(s)  Impaired balance/gait;Impaired mobility;Medication side effect;History of fall(s)   Functional Status Survey:  dependent in adls, hoyer lift, incontinent  Vitals:   09/02/17 1349  BP: (!) 146/83  Pulse: 67  Resp: 14  Temp: 97.8 F (36.6 C)  TempSrc: Oral  SpO2: 91%  Weight: 176 lb (79.8 kg)   Body mass index is 28.41 kg/m. Physical Exam  Constitutional: He appears well-developed. No distress.  HENT:  Head: Normocephalic and atraumatic.  Eyes: Conjunctivae are normal.  Cardiovascular: Normal rate, regular rhythm, normal heart sounds and intact distal pulses.  Pulmonary/Chest: Effort normal and breath sounds normal. No respiratory distress. He has no wheezes.  Abdominal: Soft. Bowel sounds are normal. He exhibits no distension. There is no tenderness.  Musculoskeletal: Normal range of motion.  Skin: Skin is warm and dry.    Labs reviewed: Recent Labs    09/13/16 01/31/17 06/09/17  NA 144 141 143  K 5.4* 5.2 5.6*  BUN 25* 25* 27*  CREATININE 1.6* 1.7* 1.5*    Recent Labs    01/31/17 04/03/17 0700  AST 12* 12*  ALT 6* 8*  ALKPHOS 68 68   Recent Labs    09/13/16 01/31/17  WBC 7.9 7.0  HGB 11.2* 11.1*  HCT 35* 33*  PLT 207 188   Lab Results  Component Value Date   TSH 3.02 01/15/2017   Lab Results  Component Value Date   HGBA1C 6.5 06/09/2017   Lab Results  Component Value Date   CHOL 150 09/17/2013   HDL 57 09/17/2013   LDLCALC 72 09/17/2013   TRIG 107 09/17/2013    Assessment/Plan 1. Convulsions, unspecified convulsion type ([place]) -historically in childhood on PMH, is on depakote, but for behaviors at this point with sexual disinhibition -no recent seizures  2. Type 2 diabetes mellitus with stage 4 chronic kidney disease, without long-term current use of insulin (HCC) -hba1c well controlled w/o meds or insulin or special diet  3. Closed compression fracture of thoracic vertebra with routine healing, subsequent encounter -pain improved, has had tylenol tid, salonpas patches, has percocet, but has not been needed recently   4. Mucopurulent chronic bronchitis ([place]) -acute exacerbation resolved with nebs, robitussin, mucinex -just some mild chronic cough remains of mixed etiology  5. Late onset Alzheimer's disease with behavioral disturbance -remains on namenda XR, I've been hesitant to taper due to potential behavioral benefit but does not have any functional status maintenance benefit for him at this point  6. Oropharyngeal dysphagia -ongoing, cont modified diet, aspiration precautions  Family/ staff Communication: discussed with snf nurse  Labs/tests ordered:  No new  Kiannah Grunow L. Priscille Shadduck, D.O. [facility] [address], [place] [address] Cell Phone (Mon-Fri 8am-5pm):  [phone] On Call:  [phone] & follow prompts after 5pm & weekends Office Phone:  [phone] Office Fax:  [phone]

## 2017-09-10 DIAGNOSIS — R05 Cough: Secondary | ICD-10-CM | POA: Diagnosis not present

## 2017-09-12 ENCOUNTER — Encounter: Payer: Self-pay | Admitting: Internal Medicine

## 2017-09-15 ENCOUNTER — Non-Acute Institutional Stay (SKILLED_NURSING_FACILITY): Payer: Medicare Other | Admitting: Adult Health

## 2017-09-15 ENCOUNTER — Encounter: Payer: Self-pay | Admitting: Adult Health

## 2017-09-15 DIAGNOSIS — M1A9XX1 Chronic gout, unspecified, with tophus (tophi): Secondary | ICD-10-CM | POA: Diagnosis not present

## 2017-09-15 NOTE — Progress Notes (Signed)
Location:  [facility] Number: 137A Place of Service:  SNF [phone]) Provider:  Algis Greenhouse, AGNP-Student/Esteen Delpriore, ANP-BC  [name], DO  Patient Care Team: [name], DO as PCP - General (Geriatric Medicine) Raynelle Bring, MD as Consulting Physician (Urology) Jarome Matin, MD as Consulting Physician (Dermatology)  Extended Emergency Contact Information Primary Emergency Contact: Nosal,[name] Address: [address]          [place], [place] [address] [name] of [place] Phone: [phone] Mobile Phone: [phone] Relation: Son  Code Status:  DNR Goals of care: Advanced Directive information Advanced Directives 09/02/2017  Does Patient Have a Medical Advance Directive? Yes  Type of Advance Directive Out of facility DNR (pink MOST or yellow form);[place];Living will  Does patient want to make changes to medical advance directive? No - Patient declined  Copy of [place] in Chart? Yes  Would patient like information on creating a medical advance directive? -  Pre-existing out of facility DNR order (yellow form or pink MOST form) Pink MOST form placed in chart (order not valid for inpatient use);Yellow form placed in chart (order not valid for inpatient use)     Chief Complaint  Patient presents with  . Gout    HPI:  Pt is a 84 y.o. male seen today for an acute visit for an inflamed, tender right pointer finger. RN has noted inflammation in only the right pointer finger for a few days now. Pt complains of pain when the tophaceous appearing joint is touched per staff. Pt unable to give information on changes. 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 No other treatments to date.    Past Medical History:  Diagnosis Date  . Actinic keratosis   . Alzheimer disease 09/13/2008   Qualifier: Diagnosis of  By: Nils Pyle CMA ([place]), Mearl Latin    . Anemia  2012  . Arthritis   . Cervicalgia 1998   degenereative disease of CS and foraminal narrowing at C5-6  . Coronary atherosclerosis of native coronary artery 2002   60% stenois of the 1st diagonal of the LAD  . Deaf   . Diabetes mellitus 1997  . Epilepsy ([place]) 1943  . Gastric ulcer 2012  . Generalized osteoarthrosis, unspecified site   . Glaucoma   . H/O prostate cancer 2011   treated with radiation  . History of fall 2013  . Hyperlipidemia   . Hypertension 2008  . Macular degeneration (senile) of retina, unspecified   . Major depressive disorder, single episode, unspecified   . Osteoarthritis of knee 10/07/2013   Qualifier: Diagnosis of  By: Nils Pyle CMA (AAMA), Mearl Latin    . Other B-complex deficiencies 2012  . Personal history of colonic polyps 2012   adenomatous  . Reflux esophagitis 2002  . Type II or unspecified type diabetes mellitus with peripheral circulatory disorders, not stated as uncontrolled(250.70) 05/24/2009   Qualifier: Diagnosis of  By: Caryl Comes, MD, Remus Blake   . Unspecified venous (peripheral) insufficiency   . Unspecified vitamin D deficiency 2010   Past Surgical History:  Procedure Laterality Date  . APPENDECTOMY  1947  . CARPAL TUNNEL RELEASE Bilateral 2005  . COLONOSCOPY  10/22/10   multiple adenomatous polyps  . MYRINGOTOMY Right   . TONSILLECTOMY  1930    Allergies  Allergen Reactions  . Aspirin   . Nsaids     Outpatient Encounter Medications as of 09/15/2017  Medication Sig  .  acetaminophen (TYLENOL) 325 MG tablet Take 650 mg by mouth 3 (three) times daily.   Marland Kitchen allopurinol (ZYLOPRIM) 100 MG tablet Take 100 mg daily by mouth.  . Calcium 600-400 MG-UNIT CHEW Chew 1 tablet by mouth 2 (two) times daily.  . carboxymethylcellulose (REFRESH PLUS) 0.5 % SOLN 2 drops 2 (two) times daily.  . Cholecalciferol (VITAMIN D) 2000 UNITS CAPS Take by mouth.  . Cyanocobalamin (VITAMIN B 12 PO) Take 1,000 mcg by mouth daily.  Marland Kitchen dextromethorphan-guaiFENesin  (ROBITUSSIN-DM) 10-100 MG/5ML liquid Take 10 mLs by mouth every 6 (six) hours as needed for cough.  . divalproex (DEPAKOTE SPRINKLE) 125 MG capsule Take 125 mg by mouth 2 (two) times daily.  . Eyelid Cleansers (OCUSOFT EYELID CLEANSING) PADS Apply 1 application topically daily.  . ferrous sulfate 325 (65 FE) MG tablet Take 325 mg by mouth daily with breakfast.  . ipratropium-albuterol (DUONEB) 0.5-2.5 (3) MG/3ML SOLN Take 3 mLs by nebulization 2 (two) times daily.  Marland Kitchen latanoprost (XALATAN) 0.005 % ophthalmic solution Place 1 drop into the right eye at bedtime.  . lidocaine (LIDODERM) 5 % Place 1 patch onto the skin daily. Remove & Discard patch within 12 hours or as directed by MD Salonpas brand to low back  . loratadine (CLARITIN) 10 MG tablet Take 10 mg by mouth daily.   . Memantine HCl ER (NAMENDA XR) 28 MG CP24 Take by mouth.  . nystatin cream (MYCOSTATIN) Apply 1 application topically daily. groin  . omeprazole (PRILOSEC) 20 MG capsule Take 20 mg by mouth 2 (two) times daily before a meal.  . oxyCODONE-acetaminophen (PERCOCET/ROXICET) 5-325 MG tablet Take 1 tablet by mouth 2 (two) times daily as needed for severe pain.  . polyethylene glycol (MIRALAX / GLYCOLAX) packet Take 17 g by mouth daily.  Marland Kitchen dextromethorphan (DELSYM) 30 MG/5ML liquid Take 5 mLs by mouth 2 (two) times daily as needed for cough.   No facility-administered encounter medications on file as of 09/15/2017.     Review of Systems  Constitutional: Negative for activity change, appetite change, fever and unexpected weight change.  Respiratory: Positive for cough. Negative for shortness of breath and wheezing.   Cardiovascular: Negative.   Musculoskeletal: Positive for joint swelling.       Painful and swollen right pointer finger DIP joint.   Skin: Positive for color change.       Reddened distal right pointer finger.     Immunization History  Administered Date(s) Administered  . Influenza Inj Mdck Quad Pf 05/09/2016    . Influenza-Unspecified 05/21/2013, 04/26/2014, 05/04/2015, 05/15/2017  . Pneumococcal Conjugate-13 09/12/2016  . Pneumococcal Polysaccharide-23 10/12/2010  . Td 10/11/2011  . Zoster 10/17/2010   Pertinent  Health Maintenance Due  Topic Date Due  . OPHTHALMOLOGY EXAM  10/23/2017  . HEMOGLOBIN A1C  12/07/2017  . FOOT EXAM  06/02/2018  . INFLUENZA VACCINE  Completed  . PNA vac Low Risk Adult  Completed  . URINE MICROALBUMIN  Discontinued   Fall Risk  11/13/2016 03/16/2015 11/07/2014  Falls in the past year? No No No  Risk for fall due to : - History of fall(s) Impaired balance/gait;Impaired mobility;Medication side effect;History of fall(s)   Functional Status Survey:    Vitals:   09/15/17 1135  Temp: 98.6 F (37 C)  SpO2: 93%   There is no height or weight on file to calculate BMI. Physical Exam  Constitutional: He appears well-developed and well-nourished.  HENT:  Mouth/Throat: Oropharynx is clear and moist and mucous membranes are normal.  Cardiovascular: Normal rate, regular rhythm, normal heart sounds and intact distal pulses.  Pulmonary/Chest: Effort normal and breath sounds normal.  Abdominal: Soft. Bowel sounds are normal. He exhibits no distension. There is no tenderness.  Musculoskeletal: He exhibits edema.       Right hand: He exhibits decreased range of motion, tenderness, bony tenderness and swelling.       Hands: Neurological:  Drowsy. Unable to participate in assessment and UTA cognition.   Skin: Skin is warm and intact. Lesion noted. There is erythema.     Psychiatric: His affect is labile.  Labile mood is patient baseline.  Nursing note and vitals reviewed.   Labs reviewed: Recent Labs    01/31/17 06/09/17  NA 141 143  K 5.2 5.6*  BUN 25* 27*  CREATININE 1.7* 1.5*   Recent Labs    01/31/17 04/03/17 0700  AST 12* 12*  ALT 6* 8*  ALKPHOS 68 68   Recent Labs    01/31/17  WBC 7.0  HGB 11.1*  HCT 33*  PLT 188   Lab Results  Component  Value Date   TSH 3.02 01/15/2017   Lab Results  Component Value Date   HGBA1C 6.5 06/09/2017   Lab Results  Component Value Date   CHOL 150 09/17/2013   HDL 57 09/17/2013   LDLCALC 72 09/17/2013   TRIG 107 09/17/2013    Significant Diagnostic Results in last 30 days:  No results found.  Assessment/Plan 1. Tophaceous gout Continue allopurinol 100 mg BID. Prednisone 20 mg BID x5 days with food ordered.   Family/ staff Communication: Orders communicated with staff RN.   Labs/tests ordered:  n/a

## 2017-09-19 ENCOUNTER — Encounter: Payer: Self-pay | Admitting: Adult Health

## 2017-09-19 ENCOUNTER — Non-Acute Institutional Stay (SKILLED_NURSING_FACILITY): Payer: Medicare Other | Admitting: Adult Health

## 2017-09-19 DIAGNOSIS — L089 Local infection of the skin and subcutaneous tissue, unspecified: Secondary | ICD-10-CM | POA: Diagnosis not present

## 2017-09-19 NOTE — Progress Notes (Signed)
Location:  [facility] of Service:  SNF (31) Provider:   Cindi Carbon, ANP [place] [phone]  [name], DO  Patient Care Team: [name], DO as PCP - General (Geriatric Medicine) Raynelle Bring, MD as Consulting Physician (Urology) Jarome Matin, MD as Consulting Physician (Dermatology)  Extended Emergency Contact Information Primary Emergency Contact: Newsham,[name] Address: [address]          [place], [place] [address] [name] of [place] Phone: [phone] Mobile Phone: [phone] Relation: Son  Code Status:  DNR Goals of care: Advanced Directive information Advanced Directives 09/02/2017  Does Patient Have a Medical Advance Directive? Yes  Type of Advance Directive Out of facility DNR (pink MOST or yellow form);[place];Living will  Does patient want to make changes to medical advance directive? No - Patient declined  Copy of [place] in Chart? Yes  Would patient like information on creating a medical advance directive? -  Pre-existing out of facility DNR order (yellow form or pink MOST form) Pink MOST form placed in chart (order not valid for inpatient use);Yellow form placed in chart (order not valid for inpatient use)     Chief Complaint  Patient presents with  . Acute Visit    right index finger purulent drainage    HPI:  Pt is a 84 y.o. male seen today for an acute visit for right index finger with purulent drainage. He was seen on 2/25 and diagnosed with a gouty flare and placed on prednisone. The right index finger has has a gouty flare in the past that responded to prednisone. He is also on allopurinol. There is a hx of CKD which contributes to his gout.  The nurse reported that there was some purulent drainage coming form the DIP of the right index finger. This joint is tophaceous chronically.  The surrounding redness to the joint has receded with  prednisone but not resolved. He is on his last day of prednisone.  Uric acid 5.5 06/09/17 Lab Results  Component Value Date   BUN 27 (A) 06/09/2017   Lab Results  Component Value Date   CREATININE 1.5 (A) 06/09/2017     Past Medical History:  Diagnosis Date  . Actinic keratosis   . Alzheimer disease 09/13/2008   Qualifier: Diagnosis of  By: Nils Pyle CMA ([place]), Mearl Latin    . Anemia 2012  . Arthritis   . Cervicalgia 1998   degenereative disease of CS and foraminal narrowing at C5-6  . Coronary atherosclerosis of native coronary artery 2002   60% stenois of the 1st diagonal of the LAD  . Deaf   . Diabetes mellitus 1997  . Epilepsy ([place]) 1943  . Gastric ulcer 2012  . Generalized osteoarthrosis, unspecified site   . Glaucoma   . H/O prostate cancer 2011   treated with radiation  . History of fall 2013  . Hyperlipidemia   . Hypertension 2008  . Macular degeneration (senile) of retina, unspecified   . Major depressive disorder, single episode, unspecified   . Osteoarthritis of knee 10/07/2013   Qualifier: Diagnosis of  By: Nils Pyle CMA (AAMA), Mearl Latin    . Other B-complex deficiencies 2012  . Personal history of colonic polyps 2012   adenomatous  . Reflux esophagitis 2002  . Type II or unspecified type diabetes mellitus with peripheral circulatory disorders, not stated as uncontrolled(250.70) 05/24/2009   Qualifier: Diagnosis of  By: Caryl Comes, MD, Remus Blake   .  Unspecified venous (peripheral) insufficiency   . Unspecified vitamin D deficiency 2010   Past Surgical History:  Procedure Laterality Date  . APPENDECTOMY  1947  . CARPAL TUNNEL RELEASE Bilateral 2005  . COLONOSCOPY  10/22/10   multiple adenomatous polyps  . MYRINGOTOMY Right   . TONSILLECTOMY  1930    Allergies  Allergen Reactions  . Aspirin   . Nsaids     Outpatient Encounter Medications as of 09/19/2017  Medication Sig  . acetaminophen (TYLENOL) 325 MG tablet Take 650 mg by mouth 3 (three) times daily.    Marland Kitchen allopurinol (ZYLOPRIM) 100 MG tablet Take 100 mg daily by mouth.  . Calcium 600-400 MG-UNIT CHEW Chew 1 tablet by mouth 2 (two) times daily.  . carboxymethylcellulose (REFRESH PLUS) 0.5 % SOLN 2 drops 2 (two) times daily.  . Cholecalciferol (VITAMIN D) 2000 UNITS CAPS Take by mouth.  . Cyanocobalamin (VITAMIN B 12 PO) Take 1,000 mcg by mouth daily.  Marland Kitchen dextromethorphan (DELSYM) 30 MG/5ML liquid Take 5 mLs by mouth 2 (two) times daily as needed for cough.  . dextromethorphan-guaiFENesin (ROBITUSSIN-DM) 10-100 MG/5ML liquid Take 10 mLs by mouth every 6 (six) hours as needed for cough.  . divalproex (DEPAKOTE SPRINKLE) 125 MG capsule Take 125 mg by mouth 2 (two) times daily.  . Eyelid Cleansers (OCUSOFT EYELID CLEANSING) PADS Apply 1 application topically daily.  . ferrous sulfate 325 (65 FE) MG tablet Take 325 mg by mouth daily with breakfast.  . ipratropium-albuterol (DUONEB) 0.5-2.5 (3) MG/3ML SOLN Take 3 mLs by nebulization 2 (two) times daily.  Marland Kitchen latanoprost (XALATAN) 0.005 % ophthalmic solution Place 1 drop into the right eye at bedtime.  . lidocaine (LIDODERM) 5 % Place 1 patch onto the skin daily. Remove & Discard patch within 12 hours or as directed by MD Salonpas brand to low back  . loratadine (CLARITIN) 10 MG tablet Take 10 mg by mouth daily.   . Memantine HCl ER (NAMENDA XR) 28 MG CP24 Take by mouth.  . nystatin cream (MYCOSTATIN) Apply 1 application topically daily. groin  . omeprazole (PRILOSEC) 20 MG capsule Take 20 mg by mouth 2 (two) times daily before a meal.  . oxyCODONE-acetaminophen (PERCOCET/ROXICET) 5-325 MG tablet Take 1 tablet by mouth 2 (two) times daily as needed for severe pain.  . polyethylene glycol (MIRALAX / GLYCOLAX) packet Take 17 g by mouth daily.   No facility-administered encounter medications on file as of 09/19/2017.     Review of Systems  Constitutional: Negative for activity change, appetite change, chills, diaphoresis, fatigue, fever and unexpected  weight change.  Musculoskeletal: Positive for arthralgias, gait problem and joint swelling.  Skin: Positive for color change, rash and wound.    Immunization History  Administered Date(s) Administered  . Influenza Inj Mdck Quad Pf 05/09/2016  . Influenza-Unspecified 05/21/2013, 04/26/2014, 05/04/2015, 05/15/2017  . Pneumococcal Conjugate-13 09/12/2016  . Pneumococcal Polysaccharide-23 10/12/2010  . Td 10/11/2011  . Zoster 10/17/2010   Pertinent  Health Maintenance Due  Topic Date Due  . OPHTHALMOLOGY EXAM  10/23/2017  . HEMOGLOBIN A1C  12/07/2017  . FOOT EXAM  06/02/2018  . INFLUENZA VACCINE  Completed  . PNA vac Low Risk Adult  Completed  . URINE MICROALBUMIN  Discontinued   Fall Risk  11/13/2016 03/16/2015 11/07/2014  Falls in the past year? No No No  Risk for fall due to : - History of fall(s) Impaired balance/gait;Impaired mobility;Medication side effect;History of fall(s)   Functional Status Survey:    There were no vitals  filed for this visit. There is no height or weight on file to calculate BMI. Physical Exam  Musculoskeletal: He exhibits edema and tenderness.  Right index finger with raised purulent area to the DIP joint. Surrounding redness has reduced but is still present. Small amt of purulent matter coming from the area and it remains tender. Tophaceous DIP noted chronically to the right DIP.  Skin: Skin is warm and dry.    Labs reviewed: Recent Labs    01/31/17 06/09/17  NA 141 143  K 5.2 5.6*  BUN 25* 27*  CREATININE 1.7* 1.5*   Recent Labs    01/31/17 04/03/17 0700  AST 12* 12*  ALT 6* 8*  ALKPHOS 68 68   Recent Labs    01/31/17  WBC 7.0  HGB 11.1*  HCT 33*  PLT 188   Lab Results  Component Value Date   TSH 3.02 01/15/2017   Lab Results  Component Value Date   HGBA1C 6.5 06/09/2017   Lab Results  Component Value Date   CHOL 150 09/17/2013   HDL 57 09/17/2013   LDLCALC 72 09/17/2013   TRIG 107 09/17/2013    Significant Diagnostic  Results in last 30 days:  No results found.  Assessment/Plan  1. Infection of index finger  Keflex 500 mg TID for 7 days Clean right index finger with NS daily and apply antibiotic ointment and gauze  Family/ staff Communication: discussed with staff  Labs/tests ordered: NA

## 2017-10-02 ENCOUNTER — Non-Acute Institutional Stay (SKILLED_NURSING_FACILITY): Payer: Medicare Other | Admitting: Adult Health

## 2017-10-02 DIAGNOSIS — S22000D Wedge compression fracture of unspecified thoracic vertebra, subsequent encounter for fracture with routine healing: Secondary | ICD-10-CM

## 2017-10-02 DIAGNOSIS — E559 Vitamin D deficiency, unspecified: Secondary | ICD-10-CM

## 2017-10-02 DIAGNOSIS — E1122 Type 2 diabetes mellitus with diabetic chronic kidney disease: Secondary | ICD-10-CM

## 2017-10-02 DIAGNOSIS — G301 Alzheimer's disease with late onset: Secondary | ICD-10-CM

## 2017-10-02 DIAGNOSIS — M1A9XX1 Chronic gout, unspecified, with tophus (tophi): Secondary | ICD-10-CM | POA: Diagnosis not present

## 2017-10-02 DIAGNOSIS — F0281 Dementia in other diseases classified elsewhere with behavioral disturbance: Secondary | ICD-10-CM

## 2017-10-02 DIAGNOSIS — N184 Chronic kidney disease, stage 4 (severe): Secondary | ICD-10-CM | POA: Diagnosis not present

## 2017-10-02 DIAGNOSIS — N183 Chronic kidney disease, stage 3 unspecified: Secondary | ICD-10-CM

## 2017-10-02 DIAGNOSIS — J411 Mucopurulent chronic bronchitis: Secondary | ICD-10-CM | POA: Diagnosis not present

## 2017-10-02 LAB — HM DIABETES FOOT EXAM

## 2017-10-02 NOTE — Progress Notes (Signed)
Location:  Occupational psychologist of Service:  SNF (31) Provider:   Cindi Carbon, ANP [place] [phone]   [name], DO  Patient Care Team: [name], DO as PCP - General (Geriatric Medicine) Raynelle Bring, MD as Consulting Physician (Urology) Jarome Matin, MD as Consulting Physician (Dermatology)  Extended Emergency Contact Information Primary Emergency Contact: Ra,[name] Address: [address]          [place], [place] [address] [name] of [place] Phone: [phone] Mobile Phone: [phone] Relation: Son  Code Status:  DNR Goals of care: Advanced Directive information Advanced Directives 09/02/2017  Does Patient Have a Medical Advance Directive? Yes  Type of Advance Directive Out of facility DNR (pink MOST or yellow form);[place];Living will  Does patient want to make changes to medical advance directive? No - Patient declined  Copy of [place] in Chart? Yes  Would patient like information on creating a medical advance directive? -  Pre-existing out of facility DNR order (yellow form or pink MOST form) Pink MOST form placed in chart (order not valid for inpatient use);Yellow form placed in chart (order not valid for inpatient use)     Chief Complaint  Patient presents with  . Medical Management of Chronic Issues    HPI:  Pt is a 84 y.o. male seen today for medical management of chronic diseases.  He resides in skilled care due to cognitive and functional losses related to dementia.   Compression fractures: staff have not noted any back pain with transfers. He is currently taking tylenol and a lidocaine patch. He has not needed any prn percocet.  Dec 2018:Thoracic and lumbar xray: showed an L1 anterior wedge fracture and a mild anterior wedge fx of T6 and T11, age undetermined  Chronic bronchitis: former smoker with chronic cough with sputum production. Unchanged  cough per nurse. He also has a hx of dysphagia. Sats are 93% on room air and he is afebrile. Intermittently he needs oxygen but refuses to wear it. The nurse also reports that the scheduled duonebs end up in the floor when placed on his face.  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 Symptoms have resolved with small lingering open area covered with a dressing. Uric acid 5.5 06/09/17  DM II: diet controlled CBGs 136-155 fasting Lab Results  Component Value Date   HGBA1C 6.5 06/09/2017   Alzheimer's dementia: he remains verbal and able to feed himself but requires assistance for all other ADLs.  There are occasional periods of combativeness during personal care. His weight was declining last year but now seems to be on a trend upward to 180 lbs.  Functional status: incontinent, hoyer lift Past Medical History:  Diagnosis Date  . Actinic keratosis   . Alzheimer disease 09/13/2008   Qualifier: Diagnosis of  By: Nils Pyle CMA ([place]), Mearl Latin    . Anemia 2012  . Arthritis   . Cervicalgia 1998   degenereative disease of CS and foraminal narrowing at C5-6  . Coronary atherosclerosis of native coronary artery 2002   60% stenois of the 1st diagonal of the LAD  . Deaf   . Diabetes mellitus 1997  . Epilepsy ([place]) 1943  . Gastric ulcer 2012  . Generalized osteoarthrosis, unspecified site   . Glaucoma   . H/O prostate cancer 2011   treated with radiation  . History of  fall 2013  . Hyperlipidemia   . Hypertension 2008  . Macular degeneration (senile) of retina, unspecified   . Major depressive disorder, single episode, unspecified   . Osteoarthritis of knee 10/07/2013   Qualifier: Diagnosis of  By: Nils Pyle CMA (AAMA), Mearl Latin    . Other B-complex deficiencies 2012  . Personal history of colonic polyps 2012   adenomatous  . Reflux esophagitis 2002  . Type II or unspecified type diabetes mellitus with  peripheral circulatory disorders, not stated as uncontrolled(250.70) 05/24/2009   Qualifier: Diagnosis of  By: Caryl Comes, MD, Remus Blake   . Unspecified venous (peripheral) insufficiency   . Unspecified vitamin D deficiency 2010   Past Surgical History:  Procedure Laterality Date  . APPENDECTOMY  1947  . CARPAL TUNNEL RELEASE Bilateral 2005  . COLONOSCOPY  10/22/10   multiple adenomatous polyps  . MYRINGOTOMY Right   . TONSILLECTOMY  1930    Allergies  Allergen Reactions  . Aspirin   . Nsaids     Outpatient Encounter Medications as of 10/02/2017  Medication Sig  . acetaminophen (TYLENOL) 325 MG tablet Take 650 mg by mouth 3 (three) times daily.   Marland Kitchen allopurinol (ZYLOPRIM) 100 MG tablet Take 100 mg daily by mouth.  . Calcium 600-400 MG-UNIT CHEW Chew 1 tablet by mouth 2 (two) times daily.  . carboxymethylcellulose (REFRESH PLUS) 0.5 % SOLN 2 drops 2 (two) times daily.  . Cholecalciferol (VITAMIN D) 2000 UNITS CAPS Take by mouth.  . Cyanocobalamin (VITAMIN B 12 PO) Take 1,000 mcg by mouth daily.  Marland Kitchen dextromethorphan-guaiFENesin (ROBITUSSIN-DM) 10-100 MG/5ML liquid Take 10 mLs by mouth every 6 (six) hours as needed for cough.  . divalproex (DEPAKOTE SPRINKLE) 125 MG capsule Take 125 mg by mouth 2 (two) times daily.  . Eyelid Cleansers (OCUSOFT EYELID CLEANSING) PADS Apply 1 application topically daily.  . ferrous sulfate 325 (65 FE) MG tablet Take 325 mg by mouth daily with breakfast.  . ipratropium-albuterol (DUONEB) 0.5-2.5 (3) MG/3ML SOLN Take 3 mLs by nebulization 2 (two) times daily.  Marland Kitchen latanoprost (XALATAN) 0.005 % ophthalmic solution Place 1 drop into the right eye at bedtime.  . lidocaine (LIDODERM) 5 % Place 1 patch onto the skin daily. Remove & Discard patch within 12 hours or as directed by MD Salonpas brand to low back  . loratadine (CLARITIN) 10 MG tablet Take 10 mg by mouth daily.   . Memantine HCl ER (NAMENDA XR) 28 MG CP24 Take by mouth.  . nystatin cream  (MYCOSTATIN) Apply 1 application topically daily. groin  . omeprazole (PRILOSEC) 20 MG capsule Take 20 mg by mouth 2 (two) times daily before a meal.  . oxyCODONE-acetaminophen (PERCOCET/ROXICET) 5-325 MG tablet Take 1 tablet by mouth 2 (two) times daily as needed for severe pain.  . polyethylene glycol (MIRALAX / GLYCOLAX) packet Take 17 g by mouth daily.  . [DISCONTINUED] dextromethorphan (DELSYM) 30 MG/5ML liquid Take 5 mLs by mouth 2 (two) times daily as needed for cough.   No facility-administered encounter medications on file as of 10/02/2017.     Review of Systems  Unable to perform ROS: Dementia    Immunization History  Administered Date(s) Administered  . Influenza Inj Mdck Quad Pf 05/09/2016  . Influenza-Unspecified 05/21/2013, 04/26/2014, 05/04/2015, 05/15/2017  . Pneumococcal Conjugate-13 09/12/2016  . Pneumococcal Polysaccharide-23 10/12/2010  . Td 10/11/2011  . Zoster 10/17/2010   Pertinent  Health Maintenance Due  Topic Date Due  . OPHTHALMOLOGY EXAM  10/23/2017  . HEMOGLOBIN A1C  12/07/2017  .  FOOT EXAM  10/03/2018  . INFLUENZA VACCINE  Completed  . PNA vac Low Risk Adult  Completed  . URINE MICROALBUMIN  Discontinued   Fall Risk  11/13/2016 03/16/2015 11/07/2014  Falls in the past year? No No No  Risk for fall due to : - History of fall(s) Impaired balance/gait;Impaired mobility;Medication side effect;History of fall(s)   Functional Status Survey:    Vitals:   10/10/17 0826  BP: 100/64  Pulse: 76  Resp: 18  Temp: (!) 97.3 F (36.3 C)  SpO2: 93%  Weight: 180 lb (81.6 kg)   Body mass index is 29.05 kg/m.  Wt Readings from Last 3 Encounters:  10/10/17 180 lb (81.6 kg)  09/02/17 176 lb (79.8 kg)  07/28/17 172 lb 9.6 oz (78.3 kg)    Physical Exam  Constitutional: No distress.  HENT:  Head: Normocephalic and atraumatic.  Neck: No JVD present. No tracheal deviation present.  Cardiovascular: Normal rate and regular rhythm.  No murmur  heard. Pulmonary/Chest: Effort normal. No respiratory distress. He has wheezes.  Abdominal: Soft. Bowel sounds are normal. He exhibits no distension. There is no tenderness.  Musculoskeletal:  Right index finger with tophaceous joint. No longer tender or swollen. Remains with mild redness. Small open area with 100% pink wound based and no drainage or warmth to the right index finger.   Lymphadenopathy:    He has no cervical adenopathy.  Neurological: He is alert.  Oriented to self only. Able to f/c. Becomes easily agitated.   Skin: Skin is warm and dry. He is not diaphoretic.  Psychiatric: He has a normal mood and affect.    Labs reviewed: Recent Labs    01/31/17 06/09/17  NA 141 143  K 5.2 5.6*  BUN 25* 27*  CREATININE 1.7* 1.5*   Recent Labs    01/31/17 04/03/17 0700  AST 12* 12*  ALT 6* 8*  ALKPHOS 68 68   Recent Labs    01/31/17  WBC 7.0  HGB 11.1*  HCT 33*  PLT 188   Lab Results  Component Value Date   TSH 3.02 01/15/2017   Lab Results  Component Value Date   HGBA1C 6.5 06/09/2017   Lab Results  Component Value Date   CHOL 150 09/17/2013   HDL 57 09/17/2013   LDLCALC 72 09/17/2013   TRIG 107 09/17/2013    Significant Diagnostic Results in last 30 days:  No results found.  Assessment/Plan  Thoracic compression fracture (HCC) Healing with no further symptoms. Will discontinue lidocaine patch.  Continue calcium and Vit D supplementation.   Type 2 diabetes mellitus with stage 4 chronic kidney disease, without long-term current use of insulin ([place]) Controlled with diet. Continue to monitor A1C q 6 mo.  CKD (chronic kidney disease) stage 3, GFR 30-59 ml/min Continue to periodically monitor BMP and avoid nephrotoxic agents   Vitamin D deficiency Continue Vit D supplementation.   Late onset Alzheimer's disease with behavioral disturbance Continue namenda and depakote due to behavioral issues. Monitor Dep level q 6 months.   Tophaceous gout With  secondary infection. Healing. Continue dry dressing until healed. Continue allopurinol 100 mg qd.   Chronic bronchitis ([place]) Controlled.  D/C scheduled duonebs and continue prn dosing. Resident continues to throw them off his face and may not be receiving benefit.   Family/ staff Communication: discussed with nurse  Labs/tests ordered:  NA

## 2017-10-10 ENCOUNTER — Encounter: Payer: Self-pay | Admitting: Adult Health

## 2017-10-10 NOTE — Assessment & Plan Note (Addendum)
Healing with no further symptoms. Will discontinue lidocaine patch.  Continue calcium and Vit D supplementation.

## 2017-10-10 NOTE — Assessment & Plan Note (Addendum)
Controlled.  D/C scheduled duonebs and continue prn dosing. Resident continues to throw them off his face and may not be receiving benefit.

## 2017-10-10 NOTE — Assessment & Plan Note (Signed)
Controlled with diet. Continue to monitor A1C q 6 mo.

## 2017-10-10 NOTE — Assessment & Plan Note (Signed)
Continue to periodically monitor BMP and avoid nephrotoxic agents

## 2017-10-10 NOTE — Assessment & Plan Note (Signed)
Continue Vit D supplementation

## 2017-10-10 NOTE — Assessment & Plan Note (Signed)
Continue namenda and depakote due to behavioral issues. Monitor Dep level q 6 months.

## 2017-10-10 NOTE — Assessment & Plan Note (Signed)
With secondary infection. Healing. Continue dry dressing until healed. Continue allopurinol 100 mg qd.

## 2017-10-31 ENCOUNTER — Non-Acute Institutional Stay (SKILLED_NURSING_FACILITY): Payer: Medicare Other | Admitting: Adult Health

## 2017-10-31 DIAGNOSIS — N184 Chronic kidney disease, stage 4 (severe): Secondary | ICD-10-CM

## 2017-10-31 DIAGNOSIS — K2960 Other gastritis without bleeding: Secondary | ICD-10-CM

## 2017-10-31 DIAGNOSIS — G301 Alzheimer's disease with late onset: Secondary | ICD-10-CM | POA: Diagnosis not present

## 2017-10-31 DIAGNOSIS — D500 Iron deficiency anemia secondary to blood loss (chronic): Secondary | ICD-10-CM | POA: Diagnosis not present

## 2017-10-31 DIAGNOSIS — R1310 Dysphagia, unspecified: Secondary | ICD-10-CM

## 2017-10-31 DIAGNOSIS — E1122 Type 2 diabetes mellitus with diabetic chronic kidney disease: Secondary | ICD-10-CM | POA: Diagnosis not present

## 2017-10-31 DIAGNOSIS — L219 Seborrheic dermatitis, unspecified: Secondary | ICD-10-CM | POA: Diagnosis not present

## 2017-10-31 DIAGNOSIS — F0281 Dementia in other diseases classified elsewhere with behavioral disturbance: Secondary | ICD-10-CM | POA: Diagnosis not present

## 2017-10-31 DIAGNOSIS — I2510 Atherosclerotic heart disease of native coronary artery without angina pectoris: Secondary | ICD-10-CM | POA: Diagnosis not present

## 2017-10-31 DIAGNOSIS — F02818 Dementia in other diseases classified elsewhere, unspecified severity, with other behavioral disturbance: Secondary | ICD-10-CM
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
## 2017-11-09 ENCOUNTER — Encounter: Payer: Self-pay | Admitting: Adult Health

## 2017-11-09 DIAGNOSIS — K2960 Other gastritis without bleeding: Secondary | ICD-10-CM | POA: Insufficient documentation

## 2017-11-09 NOTE — Assessment & Plan Note (Signed)
Continue puree diet with NTL 1:1 supervision with meals

## 2017-11-09 NOTE — Assessment & Plan Note (Signed)
Diet controlled. Goal <8% A1C

## 2017-11-09 NOTE — Assessment & Plan Note (Signed)
Continue Prilosec 20 mg BID before meals.
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
## 2017-11-09 NOTE — Assessment & Plan Note (Signed)
Continue to monitor CBC periodically. Continue iron supplementation. Noted hx of  Heme pos stool

## 2017-11-09 NOTE — Assessment & Plan Note (Signed)
Continue to monitor

## 2017-11-25 ENCOUNTER — Non-Acute Institutional Stay (SKILLED_NURSING_FACILITY): Payer: Medicare Other

## 2017-11-25 DIAGNOSIS — Z0000 Encounter for general adult medical examination without abnormal findings: Secondary | ICD-10-CM

## 2017-11-25 NOTE — Patient Instructions (Signed)
Mr. [name] , Thank you for taking time to come for your Medicare Wellness Visit. I appreciate your ongoing commitment to your health goals. Please review the following plan we discussed and let me know if I can assist you in the future.   Screening recommendations/referrals: Colonoscopy excluded, over age 82 Recommended yearly ophthalmology/optometry visit for glaucoma screening and checkup Recommended yearly dental visit for hygiene and checkup  Vaccinations: Influenza vaccine up to date, due 2019 fall season Pneumococcal vaccine up to date, completed Tdap vaccine up to date, due 10/10/2021 Shingles vaccine not in past records    Advanced directives: In chart  Conditions/risks identified: none  Next appointment: Dr. Mariea Clonts makes rounds  Preventive Care 38 Years and Older, Male Preventive care refers to lifestyle choices and visits with your health care provider that can promote health and wellness. What does preventive care include?  A yearly physical exam. This is also called an annual well check.  Dental exams once or twice a year.  Routine eye exams. Ask your health care provider how often you should have your eyes checked.  Personal lifestyle choices, including:  Daily care of your teeth and gums.  Regular physical activity.  Eating a healthy diet.  Avoiding tobacco and drug use.  Limiting alcohol use.  Practicing safe sex.  Taking low doses of aspirin every day.  Taking vitamin and mineral supplements as recommended by your health care provider. What happens during an annual well check? The services and screenings done by your health care provider during your annual well check will depend on your age, overall health, lifestyle risk factors, and family history of disease. Counseling  Your health care provider may ask you questions about your:  Alcohol use.  Tobacco use.  Drug use.  Emotional well-being.  Home and relationship well-being.  Sexual  activity.  Eating habits.  History of falls.  Memory and ability to understand (cognition).  Work and work Statistician. Screening  You may have the following tests or measurements:  Height, weight, and BMI.  Blood pressure.  Lipid and cholesterol levels. These may be checked every 5 years, or more frequently if you are over 29 years old.  Skin check.  Lung cancer screening. You may have this screening every year starting at age 25 if you have a 30-pack-year history of smoking and currently smoke or have quit within the past 15 years.  Fecal occult blood test (FOBT) of the stool. You may have this test every year starting at age 53.  Flexible sigmoidoscopy or colonoscopy. You may have a sigmoidoscopy every 5 years or a colonoscopy every 10 years starting at age 74.  Prostate cancer screening. Recommendations will vary depending on your family history and other risks.  Hepatitis C blood test.  Hepatitis B blood test.  Sexually transmitted disease (STD) testing.  Diabetes screening. This is done by checking your blood sugar (glucose) after you have not eaten for a while (fasting). You may have this done every 1-3 years.  Abdominal aortic aneurysm (AAA) screening. You may need this if you are a current or former smoker.  Osteoporosis. You may be screened starting at age 40 if you are at high risk. Talk with your health care provider about your test results, treatment options, and if necessary, the need for more tests. Vaccines  Your health care provider may recommend certain vaccines, such as:  Influenza vaccine. This is recommended every year.  Tetanus, diphtheria, and acellular pertussis (Tdap, Td) vaccine. You may need a  Td booster every 10 years.  Zoster vaccine. You may need this after age 86.  Pneumococcal 13-valent conjugate (PCV13) vaccine. One dose is recommended after age 70.  Pneumococcal polysaccharide (PPSV23) vaccine. One dose is recommended after age  69. Talk to your health care provider about which screenings and vaccines you need and how often you need them. This information is not intended to replace advice given to you by your health care provider. Make sure you discuss any questions you have with your health care provider. Document Released: 08/04/2015 Document Revised: 03/27/2016 Document Reviewed: 05/09/2015 Elsevier Interactive Patient Education  2017 [place] Prevention in the Home Falls can cause injuries. They can happen to people of all ages. There are many things you can do to make your home safe and to help prevent falls. What can I do on the outside of my home?  Regularly fix the edges of walkways and driveways and fix any cracks.  Remove anything that might make you trip as you walk through a door, such as a raised step or threshold.  Trim any bushes or trees on the path to your home.  Use bright outdoor lighting.  Clear any walking paths of anything that might make someone trip, such as rocks or tools.  Regularly check to see if handrails are loose or broken. Make sure that both sides of any steps have handrails.  Any raised decks and porches should have guardrails on the edges.  Have any leaves, snow, or ice cleared regularly.  Use sand or salt on walking paths during winter.  Clean up any spills in your garage right away. This includes oil or grease spills. What can I do in the bathroom?  Use night lights.  Install grab bars by the toilet and in the tub and shower. Do not use towel bars as grab bars.  Use non-skid mats or decals in the tub or shower.  If you need to sit down in the shower, use a plastic, non-slip stool.  Keep the floor dry. Clean up any water that spills on the floor as soon as it happens.  Remove soap buildup in the tub or shower regularly.  Attach bath mats securely with double-sided non-slip rug tape.  Do not have throw rugs and other things on the floor that can make  you trip. What can I do in the bedroom?  Use night lights.  Make sure that you have a light by your bed that is easy to reach.  Do not use any sheets or blankets that are too big for your bed. They should not hang down onto the floor.  Have a firm chair that has side arms. You can use this for support while you get dressed.  Do not have throw rugs and other things on the floor that can make you trip. What can I do in the kitchen?  Clean up any spills right away.  Avoid walking on wet floors.  Keep items that you use a lot in easy-to-reach places.  If you need to reach something above you, use a strong step stool that has a grab bar.  Keep electrical cords out of the way.  Do not use floor polish or wax that makes floors slippery. If you must use wax, use non-skid floor wax.  Do not have throw rugs and other things on the floor that can make you trip. What can I do with my stairs?  Do not leave any items on the stairs.  Make sure that there are handrails on both sides of the stairs and use them. Fix handrails that are broken or loose. Make sure that handrails are as long as the stairways.  Check any carpeting to make sure that it is firmly attached to the stairs. Fix any carpet that is loose or worn.  Avoid having throw rugs at the top or bottom of the stairs. If you do have throw rugs, attach them to the floor with carpet tape.  Make sure that you have a light switch at the top of the stairs and the bottom of the stairs. If you do not have them, ask someone to add them for you. What else can I do to help prevent falls?  Wear shoes that:  Do not have high heels.  Have rubber bottoms.  Are comfortable and fit you well.  Are closed at the toe. Do not wear sandals.  If you use a stepladder:  Make sure that it is fully opened. Do not climb a closed stepladder.  Make sure that both sides of the stepladder are locked into place.  Ask someone to hold it for you, if  possible.  Clearly mark and make sure that you can see:  Any grab bars or handrails.  First and last steps.  Where the edge of each step is.  Use tools that help you move around (mobility aids) if they are needed. These include:  Canes.  Walkers.  Scooters.  Crutches.  Turn on the lights when you go into a dark area. Replace any light bulbs as soon as they burn out.  Set up your furniture so you have a clear path. Avoid moving your furniture around.  If any of your floors are uneven, fix them.  If there are any pets around you, be aware of where they are.  Review your medicines with your doctor. Some medicines can make you feel dizzy. This can increase your chance of falling. Ask your doctor what other things that you can do to help prevent falls. This information is not intended to replace advice given to you by your health care provider. Make sure you discuss any questions you have with your health care provider. Document Released: 05/04/2009 Document Revised: 12/14/2015 Document Reviewed: 08/12/2014 Elsevier Interactive Patient Education  2017 [employer].

## 2017-11-25 NOTE — Progress Notes (Signed)
Subjective:   [name] is a 84 y.o. male who presents for Medicare Annual/Subsequent preventive examination at [facility]; incapacitated patient unable to answer questions appropriately  Last AWV-11/13/2016    Objective:    Vitals: BP 124/66 (BP Location: Left Arm, Patient Position: Sitting)   Pulse 77   Temp (!) 97.5 F (36.4 C) (Oral)   Ht 5\' 6"  (1.676 m)   Wt 178 lb (80.7 kg)   SpO2 90%   BMI 28.73 kg/m   Body mass index is 28.73 kg/m.  Advanced Directives 11/25/2017 09/02/2017 05/20/2017 04/15/2017 02/24/2017 01/30/2017 12/03/2016  Does Patient Have a Medical Advance Directive? Yes Yes Yes Yes Yes Yes Yes  Type of Paramedic of [place];Out of facility DNR (pink MOST or yellow form) Out of facility DNR (pink MOST or yellow form);[place];Living will Out of facility DNR (pink MOST or yellow form);[place];Living will Out of facility DNR (pink MOST or yellow form);[place];Living will Out of facility DNR (pink MOST or yellow form);[place];Living will Out of facility DNR (pink MOST or yellow form);[place];Living will Out of facility DNR (pink MOST or yellow form);[place];Living will  Does patient want to make changes to medical advance directive? No - Patient declined No - Patient declined - - - - -  Copy of [place] in Chart? Yes Yes Yes Yes Yes Yes Yes  Would patient like information on creating a medical advance directive? - - - - - - -  Pre-existing out of facility DNR order (yellow form or pink MOST form) Yellow form placed in chart (order not valid for inpatient use);Pink MOST form placed in chart (order not valid for inpatient use) Pink MOST form placed in chart (order not valid for inpatient use);Yellow form placed in chart (order not valid for inpatient use) Pink MOST form placed in chart (order not  valid for inpatient use);Yellow form placed in chart (order not valid for inpatient use) Pink MOST form placed in chart (order not valid for inpatient use);Yellow form placed in chart (order not valid for inpatient use) Yellow form placed in chart (order not valid for inpatient use);Pink MOST form placed in chart (order not valid for inpatient use) Yellow form placed in chart (order not valid for inpatient use);Pink MOST form placed in chart (order not valid for inpatient use) Yellow form placed in chart (order not valid for inpatient use);Pink MOST form placed in chart (order not valid for inpatient use)    Tobacco Social History   Tobacco Use  Smoking Status Former Smoker  . Packs/day: 0.10  . Years: 20.00  . Pack years: 2.00  . Types: Cigarettes  . Last attempt to quit: 10/22/1963  . Years since quitting: 54.1  Smokeless Tobacco Never Used     Counseling given: Not Answered   Clinical Intake:  Pre-visit preparation completed: No  Pain : Faces Faces Pain Scale: No hurt  Faces Pain Scale: No hurt  Nutritional Risks: None Diabetes: Yes CBG done?: No Did pt. bring in CBG monitor from home?: No  How often do you need to have someone help you when you read instructions, pamphlets, or other written materials from your doctor or pharmacy?: 5 - Always  Interpreter Needed?: No  Information entered by :: Tyson Dense, RN  Past Medical History:  Diagnosis Date  . Actinic keratosis   . Alzheimer disease 09/13/2008  Qualifier: Diagnosis of  By: Nils Pyle CMA ([place]), Mearl Latin    . Anemia 2012  . Arthritis   . Cervicalgia 1998   degenereative disease of CS and foraminal narrowing at C5-6  . Coronary atherosclerosis of native coronary artery 2002   60% stenois of the 1st diagonal of the LAD  . Deaf   . Diabetes mellitus 1997  . Epilepsy ([place]) 1943  . Gastric ulcer 2012  . Generalized osteoarthrosis, unspecified site   . Glaucoma   . H/O prostate cancer 2011   treated with  radiation  . History of fall 2013  . Hyperlipidemia   . Hypertension 2008  . Macular degeneration (senile) of retina, unspecified   . Major depressive disorder, single episode, unspecified   . Osteoarthritis of knee 10/07/2013   Qualifier: Diagnosis of  By: Nils Pyle CMA (AAMA), Mearl Latin    . Other B-complex deficiencies 2012  . Personal history of colonic polyps 2012   adenomatous  . Reflux esophagitis 2002  . Type II or unspecified type diabetes mellitus with peripheral circulatory disorders, not stated as uncontrolled(250.70) 05/24/2009   Qualifier: Diagnosis of  By: Caryl Comes, MD, Remus Blake   . Unspecified venous (peripheral) insufficiency   . Unspecified vitamin D deficiency 2010   Past Surgical History:  Procedure Laterality Date  . APPENDECTOMY  1947  . CARPAL TUNNEL RELEASE Bilateral 2005  . COLONOSCOPY  10/22/10   multiple adenomatous polyps  . MYRINGOTOMY Right   . TONSILLECTOMY  1930   Family History  Problem Relation Age of Onset  . Heart disease Mother   . Dementia Mother    Social History   Socioeconomic History  . Marital status: Married    Spouse name: Not on file  . Number of children: Not on file  . Years of education: Not on file  . Highest education level: Not on file  Occupational History  . Not on file  Social Needs  . Financial resource strain: Not on file  . Food insecurity:    Worry: Not on file    Inability: Not on file  . Transportation needs:    Medical: Not on file    Non-medical: Not on file  Tobacco Use  . Smoking status: Former Smoker    Packs/day: 0.10    Years: 20.00    Pack years: 2.00    Types: Cigarettes    Last attempt to quit: 10/22/1963    Years since quitting: 54.1  . Smokeless tobacco: Never Used  Substance and Sexual Activity  . Alcohol use: No  . Drug use: No  . Sexual activity: Never  Lifestyle  . Physical activity:    Days per week: Not on file    Minutes per session: Not on file  . Stress: Not on file    Relationships  . Social connections:    Talks on phone: Not on file    Gets together: Not on file    Attends religious service: Not on file    Active member of club or organization: Not on file    Attends meetings of clubs or organizations: Not on file    Relationship status: Not on file  Other Topics Concern  . Not on file  Social History Narrative   Patient is Married, [name].  Retired Human resources officer. Lives in  SNF at [employer]; in this retirement community since 2004   Quit [place], no alcohol history   Patient has  Advanced planning documents: Living Will, DNR,  MOST form       Outpatient Encounter Medications as of 11/25/2017  Medication Sig  . acetaminophen (TYLENOL) 325 MG tablet Take 650 mg by mouth 3 (three) times daily.   Marland Kitchen allopurinol (ZYLOPRIM) 100 MG tablet Take 100 mg daily by mouth.  . Calcium 600-400 MG-UNIT CHEW Chew 1 tablet by mouth 2 (two) times daily.  . carboxymethylcellulose (REFRESH PLUS) 0.5 % SOLN 2 drops 2 (two) times daily.  . Cholecalciferol (VITAMIN D) 2000 UNITS CAPS Take by mouth.  . Cyanocobalamin (VITAMIN B 12 PO) Take 1,000 mcg by mouth daily.  Marland Kitchen dextromethorphan-guaiFENesin (ROBITUSSIN-DM) 10-100 MG/5ML liquid Take 10 mLs by mouth every 6 (six) hours as needed for cough.  . divalproex (DEPAKOTE SPRINKLE) 125 MG capsule Take 125 mg by mouth 2 (two) times daily.  . Eyelid Cleansers (OCUSOFT EYELID CLEANSING) PADS Apply 1 application topically daily.  . ferrous sulfate 325 (65 FE) MG tablet Take 325 mg by mouth daily with breakfast.  . ipratropium-albuterol (DUONEB) 0.5-2.5 (3) MG/3ML SOLN Take 3 mLs by nebulization every 6 (six) hours as needed.   . latanoprost (XALATAN) 0.005 % ophthalmic solution Place 1 drop into the right eye at bedtime.  Marland Kitchen loratadine (CLARITIN) 10 MG tablet Take 10 mg by mouth daily.   . Memantine HCl ER (NAMENDA XR) 28 MG CP24 Take by mouth.  Marland Kitchen omeprazole (PRILOSEC) 20 MG capsule Take 20 mg by mouth 2 (two) times  daily before a meal.  . oxyCODONE-acetaminophen (PERCOCET/ROXICET) 5-325 MG tablet Take 1 tablet by mouth 2 (two) times daily as needed for severe pain.  . polyethylene glycol (MIRALAX / GLYCOLAX) packet Take 17 g by mouth daily.   No facility-administered encounter medications on file as of 11/25/2017.     Activities of Daily Living In your present state of health, do you have any difficulty performing the following activities: 11/25/2017  Hearing? Y  Vision? N  Difficulty concentrating or making decisions? Y  Walking or climbing stairs? Y  Dressing or bathing? Y  Doing errands, shopping? Y  Preparing Food and eating ? Y  Using the Toilet? Y  In the past six months, have you accidently leaked urine? Y  Do you have problems with loss of bowel control? Y  Managing your Medications? Y  Managing your Finances? Y  Housekeeping or managing your Housekeeping? Y  Some recent data might be hidden    Patient Care Team: [name], DO as PCP - General (Geriatric Medicine) Raynelle Bring, MD as Consulting Physician (Urology) Jarome Matin, MD as Consulting Physician (Dermatology)   Assessment:   This is a routine wellness examination for Kian.  Exercise Activities and Dietary recommendations Current Exercise Habits: The patient does not participate in regular exercise at present, Exercise limited by: orthopedic condition(s);neurologic condition(s)  Goals    None      Fall Risk Fall Risk  11/25/2017 11/13/2016 03/16/2015 11/07/2014  Falls in the past year? No No No No  Risk for fall due to : - - History of fall(s) Impaired balance/gait;Impaired mobility;Medication side effect;History of fall(s)   Is the patient's home free of loose throw rugs in walkways, pet beds, electrical cords, etc?   yes      Grab bars in the bathroom? yes      Handrails on the stairs?   yes      Adequate lighting?   yes  Depression Screen PHQ 2/9 Scores 11/25/2017 11/13/2016 03/16/2015 11/07/2014  PHQ - 2 Score  - 0 0 -  Exception Documentation Medical reason - - Other- indicate reason in comment box  Not completed - - - not able to answer fully due to dementia    Cognitive Function: completed within last year MMSE - Mini Mental State Exam 10/29/2017 10/08/2016  Not completed: Unable to complete -  Orientation to time 0 0  Orientation to Place 0 2  Registration 0 3  Attention/ Calculation 0 2  Recall 0 0  Language- name 2 objects - 2  Language- repeat 0 0  Language- follow 3 step command 0 0  Language- read & follow direction 0 0  Write a sentence 0 0  Copy design 0 0  Total score - 9        Immunization History  Administered Date(s) Administered  . Influenza Inj Mdck Quad Pf 05/09/2016  . Influenza-Unspecified 05/21/2013, 04/26/2014, 05/04/2015, 05/15/2017  . Pneumococcal Conjugate-13 09/12/2016  . Pneumococcal Polysaccharide-23 10/12/2010  . Td 10/11/2011  . Zoster 10/17/2010    Qualifies for Shingles Vaccine? Not in past records  Screening Tests Health Maintenance  Topic Date Due  . HEMOGLOBIN A1C  12/07/2017  . INFLUENZA VACCINE  02/19/2018  . FOOT EXAM  10/03/2018  . TETANUS/TDAP  10/10/2021  . PNA vac Low Risk Adult  Completed  . OPHTHALMOLOGY EXAM  Discontinued  . URINE MICROALBUMIN  Discontinued   Cancer Screenings: Lung: Low Dose CT Chest recommended if Age 74-80 years, 30 pack-year currently smoking OR have quit w/in 15years. Patient does not qualify. Colorectal: up to date  Additional Screenings:  Hepatitis C Screening:declined  Excluded from eye exams    Plan:    I have personally reviewed and addressed the Medicare Annual Wellness questionnaire and have noted the following in the patient's chart:  A. Medical and social history B. Use of alcohol, tobacco or illicit drugs  C. Current medications and supplements D. Functional ability and status E.  Nutritional status F.  Physical activity G. Advance directives H. List of other physicians I.    Hospitalizations, surgeries, and ER visits in previous 12 months J.  [place] to include hearing, vision, cognitive, depression L. Referrals and appointments - none  In addition, I am unable to review and discuss with incapacitated patient certain preventive protocols, quality metrics, and best practice recommendations. A written personalized care plan for preventive services as well as general preventive health recommendations were provided to patient.   See attached scanned questionnaire for additional information.   Signed,   Tyson Dense, RN Nurse Health Advisor  Patient Concerns: None

## 2017-11-28 ENCOUNTER — Encounter: Payer: Self-pay | Admitting: Adult Health

## 2017-11-28 ENCOUNTER — Non-Acute Institutional Stay (SKILLED_NURSING_FACILITY): Payer: Medicare Other | Admitting: Adult Health

## 2017-11-28 DIAGNOSIS — H66006 Acute suppurative otitis media without spontaneous rupture of ear drum, recurrent, bilateral: Secondary | ICD-10-CM | POA: Diagnosis not present

## 2017-11-28 NOTE — Progress Notes (Signed)
Location:  Occupational psychologist of Service:  SNF (31) Provider:   Cindi Carbon, ANP [place] [phone]   [name], DO  Patient Care Team: [name], DO as PCP - General (Geriatric Medicine) Raynelle Bring, MD as Consulting Physician (Urology) Jarome Matin, MD as Consulting Physician (Dermatology)  Extended Emergency Contact Information Primary Emergency Contact: Byrd,Dale Address: [address]          [place], [place] [address] [name] of [place] Phone: [phone] Mobile Phone: [phone] Relation: Son  Code Status:  DNR Goals of care: Advanced Directive information Advanced Directives 11/25/2017  Does Patient Have a Medical Advance Directive? Yes  Type of Paramedic of [place];Out of facility DNR (pink MOST or yellow form)  Does patient want to make changes to medical advance directive? No - Patient declined  Copy of [place] in Chart? Yes  Would patient like information on creating a medical advance directive? -  Pre-existing out of facility DNR order (yellow form or pink MOST form) Yellow form placed in chart (order not valid for inpatient use);Pink MOST form placed in chart (order not valid for inpatient use)     Chief Complaint  Patient presents with  . Acute Visit    ear pain    HPI:  Pt is a 84 y.o. male seen today for an acute visit for ear pain. [name] has advanced dementia but is verbal and reported ear pain. He has been placing his hands on his left ear. One week ago the staff noticed some purulent drainage but then it resolved. No fever noted. He has a hx of recurrent otitis with TM rupture. He has been seen by ENT wit no new orders.    Past Medical History:  Diagnosis Date  . Actinic keratosis   . Alzheimer disease 09/13/2008   Qualifier: Diagnosis of  By: [name] CMA ([place]), [name]    . Anemia 2012  . Arthritis   . Cervicalgia 1998   degenereative disease of CS and foraminal narrowing at C5-6  . Coronary atherosclerosis of native coronary artery 2002   60% stenois of the 1st diagonal of the LAD  . Deaf   . Diabetes mellitus 1997  . Epilepsy ([place]) 1943  . Gastric ulcer 2012  . Generalized osteoarthrosis, unspecified site   . Glaucoma   . H/O prostate cancer 2011   treated with radiation  . History of fall 2013  . Hyperlipidemia   . Hypertension 2008  . Macular degeneration (senile) of retina, unspecified   . Major depressive disorder, single episode, unspecified   . Osteoarthritis of knee 10/07/2013   Qualifier: Diagnosis of  By: [name] CMA (AAMA), [name]    . Other B-complex deficiencies 2012  . Personal history of colonic polyps 2012   adenomatous  . Reflux esophagitis 2002  . Type II or unspecified type diabetes mellitus with peripheral circulatory disorders, not stated as uncontrolled(250.70) 05/24/2009   Qualifier: Diagnosis of  By: Dale Comes, MD, [name]   . Unspecified venous (peripheral) insufficiency   . Unspecified vitamin D deficiency 2010   Past Surgical History:  Procedure Laterality Date  . APPENDECTOMY  1947  . CARPAL TUNNEL RELEASE Bilateral 2005  . COLONOSCOPY  10/22/10   multiple adenomatous polyps  . MYRINGOTOMY Right   . TONSILLECTOMY  1930    Allergies  Allergen Reactions  . Aspirin   . Nsaids     Outpatient Encounter  Medications as of 11/28/2017  Medication Sig  . acetaminophen (TYLENOL) 325 MG tablet Take 650 mg by mouth 3 (three) times daily.   Marland Kitchen allopurinol (ZYLOPRIM) 100 MG tablet Take 100 mg daily by mouth.  . Calcium 600-400 MG-UNIT CHEW Chew 1 tablet by mouth 2 (two) times daily.  . carboxymethylcellulose (REFRESH PLUS) 0.5 % SOLN 2 drops 2 (two) times daily.  . Cholecalciferol (VITAMIN D) 2000 UNITS CAPS Take by mouth.  . Cyanocobalamin (VITAMIN B 12 PO) Take 1,000 mcg by mouth daily.  Marland Kitchen dextromethorphan-guaiFENesin (ROBITUSSIN-DM) 10-100 MG/5ML liquid Take 10  mLs by mouth every 6 (six) hours as needed for cough.  . divalproex (DEPAKOTE SPRINKLE) 125 MG capsule Take 125 mg by mouth 2 (two) times daily.  . Eyelid Cleansers (OCUSOFT EYELID CLEANSING) PADS Apply 1 application topically daily.  . ferrous sulfate 325 (65 FE) MG tablet Take 325 mg by mouth daily with breakfast.  . ipratropium-albuterol (DUONEB) 0.5-2.5 (3) MG/3ML SOLN Take 3 mLs by nebulization every 6 (six) hours as needed.   . latanoprost (XALATAN) 0.005 % ophthalmic solution Place 1 drop into the right eye at bedtime.  Marland Kitchen loratadine (CLARITIN) 10 MG tablet Take 10 mg by mouth daily.   . Memantine HCl ER (NAMENDA XR) 28 MG CP24 Take by mouth.  Marland Kitchen omeprazole (PRILOSEC) 20 MG capsule Take 20 mg by mouth 2 (two) times daily before a meal.  . polyethylene glycol (MIRALAX / GLYCOLAX) packet Take 17 g by mouth daily.  . [DISCONTINUED] oxyCODONE-acetaminophen (PERCOCET/ROXICET) 5-325 MG tablet Take 1 tablet by mouth 2 (two) times daily as needed for severe pain.   No facility-administered encounter medications on file as of 11/28/2017.     Review of Systems  Constitutional: Negative for activity change, appetite change, chills, diaphoresis, fatigue, fever and unexpected weight change.  HENT: Positive for congestion, ear discharge, ear pain, hearing loss (chronic), rhinorrhea and trouble swallowing (dysphagia). Negative for facial swelling, sinus pressure, sinus pain, sore throat, tinnitus and voice change.   Respiratory: Positive for cough. Negative for shortness of breath, wheezing and stridor.   Cardiovascular: Negative for chest pain, palpitations and leg swelling.  Gastrointestinal: Negative for abdominal distention, abdominal pain, constipation and diarrhea.  Genitourinary: Negative for difficulty urinating and dysuria.  Musculoskeletal: Positive for gait problem. Negative for arthralgias, back pain, joint swelling and myalgias.  Neurological: Negative for dizziness, seizures, syncope,  facial asymmetry, speech difficulty, weakness and headaches.  Hematological: Negative for adenopathy. Does not bruise/bleed easily.  Psychiatric/Behavioral: Positive for behavioral problems and confusion. Negative for agitation.    Immunization History  Administered Date(s) Administered  . Influenza Inj Mdck Quad Pf 05/09/2016  . Influenza-Unspecified 05/21/2013, 04/26/2014, 05/04/2015, 05/15/2017  . Pneumococcal Conjugate-13 09/12/2016  . Pneumococcal Polysaccharide-23 10/12/2010  . Td 10/11/2011  . Zoster 10/17/2010   Pertinent  Health Maintenance Due  Topic Date Due  . HEMOGLOBIN A1C  12/07/2017  . INFLUENZA VACCINE  02/19/2018  . FOOT EXAM  10/03/2018  . PNA vac Low Risk Adult  Completed  . OPHTHALMOLOGY EXAM  Discontinued  . URINE MICROALBUMIN  Discontinued   Fall Risk  11/25/2017 11/13/2016 03/16/2015 11/07/2014  Falls in the past year? No No No No  Risk for fall due to : - - History of fall(s) Impaired balance/gait;Impaired mobility;Medication side effect;History of fall(s)   Functional Status Survey:    Vitals:   11/28/17 1107  BP: 108/66  Pulse: 74  Resp: 14  Temp: (!) 97.1 F (36.2 C)  SpO2: 93%  There is no height or weight on file to calculate BMI. Physical Exam  Constitutional: He is oriented to person, place, and time. No distress.  HENT:  Head: Normocephalic and atraumatic.  Right Ear: No drainage, swelling or tenderness. No foreign bodies. No mastoid tenderness. Tympanic membrane is injected. Tympanic membrane is not perforated and not retracted. A middle ear effusion is present. Decreased hearing is noted.  Left Ear: There is drainage. No swelling or tenderness. No foreign bodies. No mastoid tenderness. Tympanic membrane is injected. Tympanic membrane is not perforated and not retracted. A middle ear effusion is present. Decreased hearing is noted.  Ears:  Nose: Rhinorrhea present. Right sinus exhibits no maxillary sinus tenderness and no frontal sinus  tenderness. Left sinus exhibits no maxillary sinus tenderness and no frontal sinus tenderness.  Mouth/Throat: No oropharyngeal exudate.  Eyes: Pupils are equal, round, and reactive to light. Conjunctivae are normal. Right eye exhibits no discharge. Left eye exhibits no discharge.  Neck: No JVD present. No tracheal deviation present. No thyromegaly present.  Cardiovascular: Normal rate and regular rhythm.  No murmur heard. Pulmonary/Chest: Effort normal and breath sounds normal. No respiratory distress. He has no wheezes.  Abdominal: Soft. Bowel sounds are normal. He exhibits no distension. There is no tenderness.  Lymphadenopathy:    He has no cervical adenopathy.  Neurological: He is alert and oriented to person, place, and time. No cranial nerve deficit.  Skin: Skin is warm and dry. He is not diaphoretic.  Psychiatric: He has a normal mood and affect.    Labs reviewed: Recent Labs    01/31/17 06/09/17  NA 141 143  K 5.2 5.6*  BUN 25* 27*  CREATININE 1.7* 1.5*   Recent Labs    01/31/17 04/03/17 0700  AST 12* 12*  ALT 6* 8*  ALKPHOS 68 68   Recent Labs    01/31/17  WBC 7.0  HGB 11.1*  HCT 33*  PLT 188   Lab Results  Component Value Date   TSH 3.02 01/15/2017   Lab Results  Component Value Date   HGBA1C 6.5 06/09/2017   Lab Results  Component Value Date   CHOL 150 09/17/2013   HDL 57 09/17/2013   LDLCALC 72 09/17/2013   TRIG 107 09/17/2013    Significant Diagnostic Results in last 30 days:  No results found.  Assessment/Plan  1. Recurrent acute suppurative otitis media without spontaneous rupture of tympanic membrane of both sides Present in both ears, left >right Augmentin 875 mg BID for 10 days with food and 8 oz of fluid with each dose  Family/ staff Communication: discussed with staff/resident  Labs/tests ordered:  NA

## 2017-12-09 ENCOUNTER — Encounter: Payer: Self-pay | Admitting: Internal Medicine

## 2017-12-09 ENCOUNTER — Non-Acute Institutional Stay (SKILLED_NURSING_FACILITY): Payer: Medicare Other | Admitting: Internal Medicine

## 2017-12-09 DIAGNOSIS — G301 Alzheimer's disease with late onset: Secondary | ICD-10-CM

## 2017-12-09 DIAGNOSIS — N184 Chronic kidney disease, stage 4 (severe): Secondary | ICD-10-CM

## 2017-12-09 DIAGNOSIS — E1122 Type 2 diabetes mellitus with diabetic chronic kidney disease: Secondary | ICD-10-CM

## 2017-12-09 DIAGNOSIS — R1310 Dysphagia, unspecified: Secondary | ICD-10-CM | POA: Diagnosis not present

## 2017-12-09 DIAGNOSIS — F02818 Dementia in other diseases classified elsewhere, unspecified severity, with other behavioral disturbance: Secondary | ICD-10-CM

## 2017-12-09 DIAGNOSIS — F0281 Dementia in other diseases classified elsewhere with behavioral disturbance: Secondary | ICD-10-CM | POA: Diagnosis not present

## 2017-12-09 DIAGNOSIS — J411 Mucopurulent chronic bronchitis: Secondary | ICD-10-CM | POA: Diagnosis not present

## 2017-12-09 DIAGNOSIS — M1A9XX1 Chronic gout, unspecified, with tophus (tophi): Secondary | ICD-10-CM

## 2017-12-09 DIAGNOSIS — Z9189 Other specified personal risk factors, not elsewhere classified: Secondary | ICD-10-CM | POA: Diagnosis not present

## 2017-12-09 NOTE — Progress Notes (Signed)
Patient ID: [name], male   DOB: 1922-03-24, 84 y.o.   MRN: [id number]  Location:  [facility] Room Number: 137 Place of Service:  SNF ([phone]) Provider:   [name], DO  Patient Care Team: [name], DO as PCP - General (Geriatric Medicine) Raynelle Bring, MD as Consulting Physician (Urology) Jarome Matin, MD as Consulting Physician (Dermatology)  Extended Emergency Contact Information Primary Emergency Contact: Mccaul,[name] Address: [address]          [place], [place] [address] [name] of [place] Phone: [phone] Mobile Phone: [phone] Relation: Son  Code Status:  DNR Goals of care: Advanced Directive information Advanced Directives 12/09/2017  Does Patient Have a Medical Advance Directive? Yes  Type of Paramedic of [place];Out of facility DNR (pink MOST or yellow form)  Does patient want to make changes to medical advance directive? No - Patient declined  Copy of [place] in Chart? Yes  Would patient like information on creating a medical advance directive? -  Pre-existing out of facility DNR order (yellow form or pink MOST form) Yellow form placed in chart (order not valid for inpatient use);Pink MOST form placed in chart (order not valid for inpatient use)     Chief Complaint  Patient presents with  . Medical Management of Chronic Issues    routine visit    HPI:  Pt is a 84 y.o. male with h/o advanced dementia dependent in adls, DMII, chronic bronchitis, OA, HTN, HL, GERD, anemia, dysphagia, gout seen today for medical management of chronic diseases.  He was treated by NP for acute suppurative otitis media a couple of weeks ago, but denies ear pain now.  Mr. Yamada was taking a nap when seen, but was easily awoken and quite conversive today.  A bit of a word salad, but talked for several minutes straight.  Normally, he does not speak to me but one or two  words.    He continues to occasionally cough, but not consistently.  He also has dysphagia and remains on pureed foods with nectar thickened liquids w/o any known aspiration episodes.    He denied back pain (compression fx) and staff did not note any grimacing during care.    He also had a gout flare since I last saw him (this episode was in Feb and affected his right index finger).  He remains on allopurinol and was treated acutely with prednisone for 5 days.  He then developed purulent drainage felt to be infectious so received a 7 day course of keflex, daily cleansing of the finger and coverage with abx ointment and gauze.  DMII:  Not on meds or insulin. Lab Results  Component Value Date   HGBA1C 6.5 06/09/2017   Weights are steady around 178 for the past couple of months.  Past Medical History:  Diagnosis Date  . Actinic keratosis   . Alzheimer disease 09/13/2008   Qualifier: Diagnosis of  By: Nils Pyle CMA ([place]), Mearl Latin    . Anemia 2012  . Arthritis   . Cervicalgia 1998   degenereative disease of CS and foraminal narrowing at C5-6  . Coronary atherosclerosis of native coronary artery 2002   60% stenois of the 1st diagonal of the LAD  . Deaf   . Diabetes mellitus 1997  . Epilepsy ([place]) 1943  . Gastric ulcer 2012  . Generalized osteoarthrosis, unspecified site   . Glaucoma   . H/O prostate cancer 2011  treated with radiation  . History of fall 2013  . Hyperlipidemia   . Hypertension 2008  . Macular degeneration (senile) of retina, unspecified   . Major depressive disorder, single episode, unspecified   . Osteoarthritis of knee 10/07/2013   Qualifier: Diagnosis of  By: Nils Pyle CMA (AAMA), Mearl Latin    . Other B-complex deficiencies 2012  . Personal history of colonic polyps 2012   adenomatous  . Reflux esophagitis 2002  . Type II or unspecified type diabetes mellitus with peripheral circulatory disorders, not stated as uncontrolled(250.70) 05/24/2009   Qualifier: Diagnosis of   By: Caryl Comes, MD, Remus Blake   . Unspecified venous (peripheral) insufficiency   . Unspecified vitamin D deficiency 2010   Past Surgical History:  Procedure Laterality Date  . APPENDECTOMY  1947  . CARPAL TUNNEL RELEASE Bilateral 2005  . COLONOSCOPY  10/22/10   multiple adenomatous polyps  . MYRINGOTOMY Right   . TONSILLECTOMY  1930    Allergies  Allergen Reactions  . Aspirin   . Nsaids     Outpatient Encounter Medications as of 12/09/2017  Medication Sig  . acetaminophen (TYLENOL) 325 MG tablet Take 650 mg by mouth 3 (three) times daily.   Marland Kitchen allopurinol (ZYLOPRIM) 100 MG tablet Take 100 mg daily by mouth.  . Calcium 600-400 MG-UNIT CHEW Chew 1 tablet by mouth 2 (two) times daily.  . carboxymethylcellulose (REFRESH PLUS) 0.5 % SOLN 2 drops 2 (two) times daily.  . Cholecalciferol (VITAMIN D) 2000 UNITS CAPS Take by mouth.  . Cyanocobalamin (VITAMIN B 12 PO) Take 1,000 mcg by mouth daily.  Marland Kitchen dextromethorphan-guaiFENesin (ROBITUSSIN-DM) 10-100 MG/5ML liquid Take 10 mLs by mouth every 6 (six) hours as needed for cough.  . divalproex (DEPAKOTE SPRINKLE) 125 MG capsule Take 125 mg by mouth 2 (two) times daily.  . Eyelid Cleansers (OCUSOFT EYELID CLEANSING) PADS Apply 1 application topically daily.  . ferrous sulfate 325 (65 FE) MG tablet Take 325 mg by mouth daily with breakfast.  . ipratropium-albuterol (DUONEB) 0.5-2.5 (3) MG/3ML SOLN Take 3 mLs by nebulization every 6 (six) hours as needed.   Marland Kitchen ketoconazole (NIZORAL) 2 % shampoo Apply 1 application topically 2 (two) times a week.  . latanoprost (XALATAN) 0.005 % ophthalmic solution Place 1 drop into the right eye at bedtime.  Marland Kitchen loratadine (CLARITIN) 10 MG tablet Take 10 mg by mouth daily.   . Memantine HCl ER (NAMENDA XR) 28 MG CP24 Take by mouth.  . nystatin (NYSTATIN) powder Apply topically daily.  Marland Kitchen omeprazole (PRILOSEC) 20 MG capsule Take 20 mg by mouth 2 (two) times daily before a meal.  . polyethylene glycol (MIRALAX /  GLYCOLAX) packet Take 17 g by mouth daily.   No facility-administered encounter medications on file as of 12/09/2017.     Review of Systems  Constitutional: Negative for activity change, appetite change, chills and fever.  HENT: Positive for trouble swallowing. Negative for congestion.   Eyes: Negative for visual disturbance.  Respiratory: Positive for cough. Negative for shortness of breath and wheezing.   Cardiovascular: Negative for chest pain and leg swelling.  Gastrointestinal: Negative for abdominal pain, constipation, diarrhea and nausea.  Genitourinary: Negative for dysuria.  Musculoskeletal: Positive for gait problem. Negative for back pain.       Uses wheelchair  Skin: Positive for pallor.  Neurological: Negative for dizziness and weakness.  Psychiatric/Behavioral: Positive for behavioral problems and confusion. Negative for sleep disturbance.    Immunization History  Administered Date(s) Administered  . Influenza Inj Mdck  Quad Pf 05/09/2016  . Influenza-Unspecified 05/21/2013, 04/26/2014, 05/04/2015, 05/15/2017  . Pneumococcal Conjugate-13 09/12/2016  . Pneumococcal Polysaccharide-23 10/12/2010  . Td 10/11/2011  . Zoster 10/17/2010  . Zoster Recombinat (Shingrix) 08/11/2017, 11/11/2017   Pertinent  Health Maintenance Due  Topic Date Due  . HEMOGLOBIN A1C  12/07/2017  . INFLUENZA VACCINE  02/19/2018  . FOOT EXAM  10/03/2018  . PNA vac Low Risk Adult  Completed  . OPHTHALMOLOGY EXAM  Discontinued  . URINE MICROALBUMIN  Discontinued   Fall Risk  11/25/2017 11/13/2016 03/16/2015 11/07/2014  Falls in the past year? No No No No  Risk for fall due to : - - History of fall(s) Impaired balance/gait;Impaired mobility;Medication side effect;History of fall(s)   Functional Status Survey:  dependent in adls  Vitals:   43/32/95 1442  BP: (!) 146/89  Pulse: 70  Resp: 16  Temp: (!) 97.2 F (36.2 C)  TempSrc: Oral  SpO2: 90%  Weight: 178 lb (80.7 kg)  Height: 5\' 6"  (1.676  m)   Body mass index is 28.73 kg/m. Physical Exam  Constitutional: He appears well-developed. No distress.  HENT:  Head: Normocephalic and atraumatic.  Cardiovascular: Normal rate, regular rhythm, normal heart sounds and intact distal pulses.  Pulmonary/Chest: Effort normal. He has wheezes.  Abdominal: Soft. Bowel sounds are normal. He exhibits no distension. There is no tenderness.  Musculoskeletal: Normal range of motion.  Neurological: He is alert.  Talking to me more than usual today, even thanked me for coming to see him (usually says one or two words and is done); word salad speech for most part  Skin: Skin is warm and dry. There is pallor.  Psychiatric: He has a normal mood and affect.    Labs reviewed: Recent Labs    01/31/17 06/09/17  NA 141 143  K 5.2 5.6*  BUN 25* 27*  CREATININE 1.7* 1.5*   Recent Labs    01/31/17 04/03/17 0700  AST 12* 12*  ALT 6* 8*  ALKPHOS 68 68   Recent Labs    01/31/17  WBC 7.0  HGB 11.1*  HCT 33*  PLT 188   Lab Results  Component Value Date   TSH 3.02 01/15/2017   Lab Results  Component Value Date   HGBA1C 6.5 06/09/2017   Lab Results  Component Value Date   CHOL 150 09/17/2013   HDL 57 09/17/2013   LDLCALC 72 09/17/2013   TRIG 107 09/17/2013   Lab Results  Component Value Date   VITAMINB12 337 07/30/2011    Assessment/Plan 1. Type 2 diabetes mellitus with stage 4 chronic kidney disease, without long-term current use of insulin (HCC) -not on meds,  Lab Results  Component Value Date   HGBA1C 6.5 06/09/2017  satisfactory especially for age and cognitive losses  2. Late onset Alzheimer's disease with behavioral disturbance -advanced, continues on namenda XR but dependent in adls so not sure how much good it's offering him--at one point I kept it in hopes it was helping his sexual inappropriateness, also on depakote for this and had significant improvment  3. Dysphagia, unspecified type -ongoing, cont modified  diet and aspiration precautions, wt stable   4. Mucopurulent chronic bronchitis ([place]) -ongoing, continues on duonebs q6h prn, has occasional wheezes, but appears comfortable  5. Tophaceous gout -continue allopurinol as did have recent flare, uric acid was at goal  6. At risk for polypharmacy -still on quite a few meds considering his advanced dementia:  ? Benefit of calcium and D (not weightbearing  any longer, does not participate in activities, b12 (was deficient in 2013, but not checked since), routine claritin   Family/ staff Communication: discussed with snf nurse  Labs/tests ordered:  Due for hba1c, check b 12 to see if needed  Noted after visit--MOST on file in epic is from prior PCP--have we updated this?  Malissa Slay L. Heavan Francom, D.O. [facility] [address], [place] [address] Cell Phone (Mon-Fri 8am-5pm):  [phone] On Call:  [phone] & follow prompts after 5pm & weekends Office Phone:  [phone] Office Fax:  [phone]

## 2017-12-24 DIAGNOSIS — E119 Type 2 diabetes mellitus without complications: Secondary | ICD-10-CM | POA: Diagnosis not present

## 2017-12-24 DIAGNOSIS — D649 Anemia, unspecified: Secondary | ICD-10-CM | POA: Diagnosis not present

## 2017-12-24 DIAGNOSIS — D519 Vitamin B12 deficiency anemia, unspecified: Secondary | ICD-10-CM | POA: Diagnosis not present

## 2017-12-24 LAB — HEMOGLOBIN A1C: Hemoglobin A1C: 7

## 2017-12-24 LAB — VITAMIN B12: VITAMIN B 12: 1007

## 2018-01-08 ENCOUNTER — Non-Acute Institutional Stay (SKILLED_NURSING_FACILITY): Payer: Medicare Other | Admitting: Adult Health

## 2018-01-08 ENCOUNTER — Encounter: Payer: Self-pay | Admitting: Adult Health

## 2018-01-08 DIAGNOSIS — F0281 Dementia in other diseases classified elsewhere with behavioral disturbance: Secondary | ICD-10-CM | POA: Diagnosis not present

## 2018-01-08 DIAGNOSIS — J411 Mucopurulent chronic bronchitis: Secondary | ICD-10-CM

## 2018-01-08 DIAGNOSIS — N183 Chronic kidney disease, stage 3 unspecified: Secondary | ICD-10-CM

## 2018-01-08 DIAGNOSIS — N184 Chronic kidney disease, stage 4 (severe): Secondary | ICD-10-CM

## 2018-01-08 DIAGNOSIS — G301 Alzheimer's disease with late onset: Secondary | ICD-10-CM

## 2018-01-08 DIAGNOSIS — E538 Deficiency of other specified B group vitamins: Secondary | ICD-10-CM

## 2018-01-08 DIAGNOSIS — E1122 Type 2 diabetes mellitus with diabetic chronic kidney disease: Secondary | ICD-10-CM

## 2018-01-08 DIAGNOSIS — K5901 Slow transit constipation: Secondary | ICD-10-CM

## 2018-01-08 DIAGNOSIS — H66005 Acute suppurative otitis media without spontaneous rupture of ear drum, recurrent, left ear: Secondary | ICD-10-CM | POA: Diagnosis not present

## 2018-01-08 NOTE — Assessment & Plan Note (Signed)
-  Continue B12 supplementation °

## 2018-01-08 NOTE — Progress Notes (Signed)
Location:  Occupational psychologist of Service:  SNF (31) Provider:   Cindi Carbon, ANP [place] [phone]   [name], DO  Patient Care Team: [name], DO as PCP - General (Geriatric Medicine) Raynelle Bring, MD as Consulting Physician (Urology) Jarome Matin, MD as Consulting Physician (Dermatology)  Extended Emergency Contact Information Primary Emergency Contact: Piltz,[name] Address: [address]          [place], [place] [address] [name] of [place] Phone: [phone] Mobile Phone: [phone] Relation: Son  Code Status:  DNR Goals of care: Advanced Directive information Advanced Directives 12/09/2017  Does Patient Have a Medical Advance Directive? Yes  Type of Paramedic of [place];Out of facility DNR (pink MOST or yellow form)  Does patient want to make changes to medical advance directive? No - Patient declined  Copy of [place] in Chart? Yes  Would patient like information on creating a medical advance directive? -  Pre-existing out of facility DNR order (yellow form or pink MOST form) Yellow form placed in chart (order not valid for inpatient use);Pink MOST form placed in chart (order not valid for inpatient use)     Chief Complaint  Patient presents with  . Medical Management of Chronic Issues    HPI:  Pt is a 84 y.o. male seen today for medical management of chronic diseases.   He resides in skilled care due to advanced dementia. There are no complaints regarding his care. He received Ativan this morning prior to a dental cleaning and is quite lethargic for my exam.   AD: behaviors improved per nurse He is able to feed himself and occasionally communicates his needs Harrel Lemon lift for transfers  DM II: diet controlled  Lab Results  Component Value Date   HGBA1C 7.0 12/24/2017   Chronic bronchitis: chronic cough with minimal sputum production Has not  needed oxygen per nurse Duonebs only as needed now, when they were scheduled he would pull them before they were complete  B12  level 1007 12/28/17 on B12 supplementation  Constipation: regular BMs with miralax  He was treated for a left otitis media in May with Augmentin for 10 days. He has not had fever, drainage or ear pain. Hx obtained from the nurse   Past Medical History:  Diagnosis Date  . Actinic keratosis   . Alzheimer disease 09/13/2008   Qualifier: Diagnosis of  By: Nils Pyle CMA ([place]), Mearl Latin    . Anemia 2012  . Arthritis   . Cervicalgia 1998   degenereative disease of CS and foraminal narrowing at C5-6  . Coronary atherosclerosis of native coronary artery 2002   60% stenois of the 1st diagonal of the LAD  . Deaf   . Diabetes mellitus 1997  . Epilepsy ([place]) 1943  . Gastric ulcer 2012  . Generalized osteoarthrosis, unspecified site   . Glaucoma   . H/O prostate cancer 2011   treated with radiation  . History of fall 2013  . Hyperlipidemia   . Hypertension 2008  . Macular degeneration (senile) of retina, unspecified   . Major depressive disorder, single episode, unspecified   . Osteoarthritis of knee 10/07/2013   Qualifier: Diagnosis of  By: Nils Pyle CMA (AAMA), Mearl Latin    . Other B-complex deficiencies 2012  . Personal history of colonic polyps 2012   adenomatous  . Reflux esophagitis 2002  . Type II or unspecified type diabetes mellitus with peripheral circulatory disorders, not stated  as uncontrolled(250.70) 05/24/2009   Qualifier: Diagnosis of  By: Caryl Comes, MD, Remus Blake   . Unspecified venous (peripheral) insufficiency   . Unspecified vitamin D deficiency 2010   Past Surgical History:  Procedure Laterality Date  . APPENDECTOMY  1947  . CARPAL TUNNEL RELEASE Bilateral 2005  . COLONOSCOPY  10/22/10   multiple adenomatous polyps  . MYRINGOTOMY Right   . TONSILLECTOMY  1930    Allergies  Allergen Reactions  . Aspirin   . Nsaids     Outpatient  Encounter Medications as of 01/08/2018  Medication Sig  . acetaminophen (TYLENOL) 325 MG tablet Take 650 mg by mouth 3 (three) times daily.   Marland Kitchen allopurinol (ZYLOPRIM) 100 MG tablet Take 100 mg daily by mouth.  . Calcium 600-400 MG-UNIT CHEW Chew 1 tablet by mouth 2 (two) times daily.  . carboxymethylcellulose (REFRESH PLUS) 0.5 % SOLN 2 drops 2 (two) times daily.  . Cholecalciferol (VITAMIN D) 2000 UNITS CAPS Take by mouth.  . Cyanocobalamin (VITAMIN B 12 PO) Take 1,000 mcg by mouth daily.  Marland Kitchen dextromethorphan-guaiFENesin (ROBITUSSIN-DM) 10-100 MG/5ML liquid Take 10 mLs by mouth every 6 (six) hours as needed for cough.  . divalproex (DEPAKOTE SPRINKLE) 125 MG capsule Take 125 mg by mouth 2 (two) times daily.  . Eyelid Cleansers (OCUSOFT EYELID CLEANSING) PADS Apply 1 application topically daily.  . ferrous sulfate 325 (65 FE) MG tablet Take 325 mg by mouth daily with breakfast.  . ipratropium-albuterol (DUONEB) 0.5-2.5 (3) MG/3ML SOLN Take 3 mLs by nebulization every 6 (six) hours as needed.   Marland Kitchen ketoconazole (NIZORAL) 2 % shampoo Apply 1 application topically 2 (two) times a week.  . latanoprost (XALATAN) 0.005 % ophthalmic solution Place 1 drop into the right eye at bedtime.  Marland Kitchen loratadine (CLARITIN) 10 MG tablet Take 10 mg by mouth daily.   . Memantine HCl ER (NAMENDA XR) 28 MG CP24 Take by mouth.  . nystatin (NYSTATIN) powder Apply topically daily.  Marland Kitchen omeprazole (PRILOSEC) 20 MG capsule Take 20 mg by mouth 2 (two) times daily before a meal.  . polyethylene glycol (MIRALAX / GLYCOLAX) packet Take 17 g by mouth daily.   No facility-administered encounter medications on file as of 01/08/2018.     Review of Systems  Unable to perform ROS: Dementia    Immunization History  Administered Date(s) Administered  . Influenza Inj Mdck Quad Pf 05/09/2016  . Influenza-Unspecified 05/21/2013, 04/26/2014, 05/04/2015, 05/15/2017  . Pneumococcal Conjugate-13 09/12/2016  . Pneumococcal Polysaccharide-23  10/12/2010  . Td 10/11/2011  . Zoster 10/17/2010  . Zoster Recombinat (Shingrix) 08/11/2017, 11/11/2017   Pertinent  Health Maintenance Due  Topic Date Due  . HEMOGLOBIN A1C  12/07/2017  . INFLUENZA VACCINE  02/19/2018  . FOOT EXAM  10/03/2018  . PNA vac Low Risk Adult  Completed  . OPHTHALMOLOGY EXAM  Discontinued  . URINE MICROALBUMIN  Discontinued   Fall Risk  11/25/2017 11/13/2016 03/16/2015 11/07/2014  Falls in the past year? No No No No  Risk for fall due to : - - History of fall(s) Impaired balance/gait;Impaired mobility;Medication side effect;History of fall(s)   Functional Status Survey:    Vitals:   01/08/18 1202  BP: 123/77  Pulse: 72  Resp: 18  Temp: 98.1 F (36.7 C)  SpO2: 90%  Weight: 175 lb 11.2 oz (79.7 kg)   Body mass index is 28.36 kg/m. Physical Exam  Constitutional: No distress.  HENT:  Head: Normocephalic and atraumatic.  Right Ear: External ear normal.  Clear  drainage to both nares Left ear canal with cerumen and small amt of yellow drainage. No erythema or swelling or tenderness during exam. Not able to assess the left TM. Right TM WNL Refused oral exam  Eyes: Pupils are equal, round, and reactive to light. Conjunctivae are normal. Right eye exhibits no discharge. Left eye exhibits no discharge.  Cardiovascular: Normal rate and regular rhythm.  No murmur heard. Pulmonary/Chest: Effort normal and breath sounds normal. No respiratory distress.  Abdominal: Soft. Bowel sounds are normal. He exhibits no distension.  Musculoskeletal:  Resist ROM  Lymphadenopathy:    He has no cervical adenopathy.  Neurological:  Sleepy but arouses to verbal stim. Combative during exam.   Skin: Skin is warm and dry. He is not diaphoretic.  Psychiatric: He has a normal mood and affect.    Labs reviewed: Recent Labs    01/31/17 06/09/17  NA 141 143  K 5.2 5.6*  BUN 25* 27*  CREATININE 1.7* 1.5*   Recent Labs    01/31/17 04/03/17 0700  AST 12* 12*  ALT 6* 8*   ALKPHOS 68 68   Recent Labs    01/31/17  WBC 7.0  HGB 11.1*  HCT 33*  PLT 188   Lab Results  Component Value Date   TSH 3.02 01/15/2017   Lab Results  Component Value Date   HGBA1C 7.0 12/24/2017   Lab Results  Component Value Date   CHOL 150 09/17/2013   HDL 57 09/17/2013   LDLCALC 72 09/17/2013   TRIG 107 09/17/2013    Significant Diagnostic Results in last 30 days:  No results found.  Assessment/Plan  Chronic bronchitis ([place]) Controlled. Continue prn oxygen and duonebs  Type 2 diabetes mellitus with stage 4 chronic kidney disease, without long-term current use of insulin (HCC) A1C rising but still with in acceptable range of <8%  Late onset Alzheimer's disease with behavioral disturbance Combative during exam, would not change depakote. Continue Namenda to preserve function of communication and feeding.   Recurrent acute suppurative otitis media without spontaneous rupture of left tympanic membrane Improved  CKD (chronic kidney disease) stage 3, GFR 30-59 ml/min Continue to periodically monitor BMP and avoid nephrotoxic agents   Vitamin B12 deficiency Continue B12 supplementation   Constipation Regular BMs reported with miralax, continue daily   Family/ staff Communication: staff  Labs/tests ordered:  NA

## 2018-01-08 NOTE — Assessment & Plan Note (Signed)
Continue to periodically monitor BMP and avoid nephrotoxic agents

## 2018-01-08 NOTE — Assessment & Plan Note (Signed)
Improved

## 2018-01-08 NOTE — Assessment & Plan Note (Signed)
Regular BMs reported with miralax, continue daily

## 2018-01-08 NOTE — Assessment & Plan Note (Signed)
A1C rising but still with in acceptable range of <8%

## 2018-01-08 NOTE — Assessment & Plan Note (Signed)
Controlled. Continue prn oxygen and duonebs

## 2018-01-08 NOTE — Assessment & Plan Note (Signed)
Combative during exam, would not change depakote. Continue Namenda to preserve function of communication and feeding.

## 2018-02-02 ENCOUNTER — Encounter: Payer: Self-pay | Admitting: Adult Health

## 2018-02-02 ENCOUNTER — Non-Acute Institutional Stay (SKILLED_NURSING_FACILITY): Payer: Medicare Other | Admitting: Adult Health

## 2018-02-02 DIAGNOSIS — I2510 Atherosclerotic heart disease of native coronary artery without angina pectoris: Secondary | ICD-10-CM | POA: Diagnosis not present

## 2018-02-02 DIAGNOSIS — F0281 Dementia in other diseases classified elsewhere with behavioral disturbance: Secondary | ICD-10-CM | POA: Diagnosis not present

## 2018-02-02 DIAGNOSIS — E1122 Type 2 diabetes mellitus with diabetic chronic kidney disease: Secondary | ICD-10-CM | POA: Diagnosis not present

## 2018-02-02 DIAGNOSIS — N183 Chronic kidney disease, stage 3 unspecified: Secondary | ICD-10-CM

## 2018-02-02 DIAGNOSIS — M1A9XX1 Chronic gout, unspecified, with tophus (tophi): Secondary | ICD-10-CM | POA: Diagnosis not present

## 2018-02-02 DIAGNOSIS — G301 Alzheimer's disease with late onset: Secondary | ICD-10-CM | POA: Diagnosis not present

## 2018-02-02 DIAGNOSIS — Z8546 Personal history of malignant neoplasm of prostate: Secondary | ICD-10-CM

## 2018-02-02 DIAGNOSIS — N184 Chronic kidney disease, stage 4 (severe): Secondary | ICD-10-CM

## 2018-02-02 DIAGNOSIS — F02818 Dementia in other diseases classified elsewhere, unspecified severity, with other behavioral disturbance: Secondary | ICD-10-CM

## 2018-02-02 NOTE — Progress Notes (Signed)
Location:  Occupational psychologist of Service:  SNF (31) Provider:   Cindi Carbon, ANP [place] [phone]   [name], DO  Patient Care Team: [name], DO as PCP - General (Geriatric Medicine) Raynelle Bring, MD as Consulting Physician (Urology) Jarome Matin, MD as Consulting Physician (Dermatology)  Extended Emergency Contact Information Primary Emergency Contact: Houchen,[name] Address: [address]          [place], [place] [address] [name] of [place] Phone: [phone] Mobile Phone: [phone] Relation: Son  Code Status:  DNR Goals of care: Advanced Directive information Advanced Directives 12/09/2017  Does Patient Have a Medical Advance Directive? Yes  Type of Paramedic of [place];Out of facility DNR (pink MOST or yellow form)  Does patient want to make changes to medical advance directive? No - Patient declined  Copy of [place] in Chart? Yes  Would patient like information on creating a medical advance directive? -  Pre-existing out of facility DNR order (yellow form or pink MOST form) Yellow form placed in chart (order not valid for inpatient use);Pink MOST form placed in chart (order not valid for inpatient use)     Chief Complaint  Patient presents with  . Medical Management of Chronic Issues    HPI:  Pt is a 84 y.o. male seen today for medical management of chronic diseases.    Dementia: periods of agitation and aggression towards staff are noted. Sexually inappropriate at times.  MMSE unable to complete in April  CKD:  Lab Results  Component Value Date   BUN 27 (A) 06/09/2017   Lab Results  Component Value Date   CREATININE 1.5 (A) 06/09/2017    Gout: no recent attacks, on allopurinol  Coronary atherosclerosis  Hx of prostate cancer in 2011 treated with radiation, no further PSA monitoring due to age/debility  DMII Lab Results    Component Value Date   HGBA1C 7.0 12/24/2017     Functional status: non ambulatory , hoyer lift Past Medical History:  Diagnosis Date  . Actinic keratosis   . Alzheimer disease 09/13/2008   Qualifier: Diagnosis of  By: Nils Pyle CMA ([place]), Mearl Latin    . Anemia 2012  . Arthritis   . Cervicalgia 1998   degenereative disease of CS and foraminal narrowing at C5-6  . Coronary atherosclerosis of native coronary artery 2002   60% stenois of the 1st diagonal of the LAD  . Deaf   . Diabetes mellitus 1997  . Epilepsy ([place]) 1943  . Gastric ulcer 2012  . Generalized osteoarthrosis, unspecified site   . Glaucoma   . H/O prostate cancer 2011   treated with radiation  . History of fall 2013  . Hyperlipidemia   . Hypertension 2008  . Macular degeneration (senile) of retina, unspecified   . Major depressive disorder, single episode, unspecified   . Osteoarthritis of knee 10/07/2013   Qualifier: Diagnosis of  By: Nils Pyle CMA (AAMA), Mearl Latin    . Other B-complex deficiencies 2012  . Personal history of colonic polyps 2012   adenomatous  . Reflux esophagitis 2002  . Type II or unspecified type diabetes mellitus with peripheral circulatory disorders, not stated as uncontrolled(250.70) 05/24/2009   Qualifier: Diagnosis of  By: Caryl Comes, MD, Remus Blake   . Unspecified venous (peripheral) insufficiency   . Unspecified vitamin D deficiency 2010   Past Surgical History:  Procedure Laterality Date  . APPENDECTOMY  1947  . CARPAL  TUNNEL RELEASE Bilateral 2005  . COLONOSCOPY  10/22/10   multiple adenomatous polyps  . MYRINGOTOMY Right   . TONSILLECTOMY  1930    Allergies  Allergen Reactions  . Aspirin   . Nsaids     Outpatient Encounter Medications as of 02/02/2018  Medication Sig  . acetaminophen (TYLENOL) 325 MG tablet Take 650 mg by mouth 3 (three) times daily.   Marland Kitchen allopurinol (ZYLOPRIM) 100 MG tablet Take 100 mg daily by mouth.  . Calcium 600-400 MG-UNIT CHEW Chew 1 tablet by mouth 2  (two) times daily.  . carboxymethylcellulose (REFRESH PLUS) 0.5 % SOLN 2 drops 2 (two) times daily.  . Cholecalciferol (VITAMIN D) 2000 UNITS CAPS Take by mouth.  . Cyanocobalamin (VITAMIN B 12 PO) Take 1,000 mcg by mouth daily.  Marland Kitchen dextromethorphan-guaiFENesin (ROBITUSSIN-DM) 10-100 MG/5ML liquid Take 10 mLs by mouth every 6 (six) hours as needed for cough.  . divalproex (DEPAKOTE SPRINKLE) 125 MG capsule Take 125 mg by mouth 2 (two) times daily.  . Eyelid Cleansers (OCUSOFT EYELID CLEANSING) PADS Apply 1 application topically daily.  . ferrous sulfate 325 (65 FE) MG tablet Take 325 mg by mouth daily with breakfast.  . ipratropium-albuterol (DUONEB) 0.5-2.5 (3) MG/3ML SOLN Take 3 mLs by nebulization every 6 (six) hours as needed.   Marland Kitchen ketoconazole (NIZORAL) 2 % shampoo Apply 1 application topically 2 (two) times a week.  . latanoprost (XALATAN) 0.005 % ophthalmic solution Place 1 drop into the right eye at bedtime.  Marland Kitchen loratadine (CLARITIN) 10 MG tablet Take 10 mg by mouth daily.   . Memantine HCl ER (NAMENDA XR) 28 MG CP24 Take by mouth.  . nystatin (NYSTATIN) powder Apply topically daily.  Marland Kitchen omeprazole (PRILOSEC) 20 MG capsule Take 20 mg by mouth 2 (two) times daily before a meal.  . polyethylene glycol (MIRALAX / GLYCOLAX) packet Take 17 g by mouth daily.   No facility-administered encounter medications on file as of 02/02/2018.     Review of Systems  Unable to perform ROS: Dementia    Immunization History  Administered Date(s) Administered  . Influenza Inj Mdck Quad Pf 05/09/2016  . Influenza-Unspecified 05/21/2013, 04/26/2014, 05/04/2015, 05/15/2017  . Pneumococcal Conjugate-13 09/12/2016  . Pneumococcal Polysaccharide-23 10/12/2010  . Td 10/11/2011  . Zoster 10/17/2010  . Zoster Recombinat (Shingrix) 08/11/2017, 11/11/2017   Pertinent  Health Maintenance Due  Topic Date Due  . INFLUENZA VACCINE  02/19/2018  . HEMOGLOBIN A1C  06/25/2018  . FOOT EXAM  10/03/2018  . PNA vac Low  Risk Adult  Completed  . OPHTHALMOLOGY EXAM  Discontinued  . URINE MICROALBUMIN  Discontinued   Fall Risk  11/25/2017 11/13/2016 03/16/2015 11/07/2014  Falls in the past year? No No No No  Risk for fall due to : - - History of fall(s) Impaired balance/gait;Impaired mobility;Medication side effect;History of fall(s)   Functional Status Survey:    Vitals:   02/11/18 0713  Weight: 175 lb 11.2 oz (79.7 kg)   Body mass index is 28.36 kg/m.  Wt Readings from Last 3 Encounters:  02/11/18 175 lb 11.2 oz (79.7 kg)  01/08/18 175 lb 11.2 oz (79.7 kg)  12/09/17 178 lb (80.7 kg)   Physical Exam  Constitutional: No distress.  HENT:  Head: Normocephalic and atraumatic.  Right Ear: External ear normal.  Left Ear: External ear normal.  Mouth/Throat: Oropharynx is clear and moist.  Neck: No JVD present.  Cardiovascular: Normal rate and regular rhythm.  No murmur heard. Pulmonary/Chest: Effort normal and breath sounds normal.  No respiratory distress. He has no wheezes.  Abdominal: Soft. Bowel sounds are normal. He exhibits no distension. There is no tenderness.  Lymphadenopathy:    He has no cervical adenopathy.  Neurological: He is alert.  Oriented to self only.   Skin: Skin is warm and dry. He is not diaphoretic.  Psychiatric:  Attempts to kiss me during the exam  Nursing note and vitals reviewed.   Labs reviewed: Recent Labs    06/09/17  NA 143  K 5.6*  BUN 27*  CREATININE 1.5*   Recent Labs    04/03/17 0700  AST 12*  ALT 8*  ALKPHOS 68   No results for input(s): WBC, NEUTROABS, HGB, HCT, MCV, PLT in the last 8760 hours. Lab Results  Component Value Date   TSH 3.02 01/15/2017   Lab Results  Component Value Date   HGBA1C 7.0 12/24/2017   Lab Results  Component Value Date   CHOL 150 09/17/2013   HDL 57 09/17/2013   LDLCALC 72 09/17/2013   TRIG 107 09/17/2013    Significant Diagnostic Results in last 30 days:  No results found.  Assessment/Plan  Type 2  diabetes mellitus with stage 4 chronic kidney disease, without long-term current use of insulin (HCC) Diet controlled A1C at goal <8%. Continue to monitor periodically.   CKD (chronic kidney disease) stage 3, GFR 30-59 ml/min Continue to periodically monitor BMP and avoid nephrotoxic agents    Tophaceous gout Check uric acid  Late onset Alzheimer's disease with behavioral disturbance Continue depakote and namenda. Check dep level and CMP  H/O prostate cancer No s/s of prostate problems but it is difficult to assess given his advanced dementia. No further PSA monitoring due to goals of care.   Coronary atherosclerosis Noted, without cardiac symptoms. BP controlled. No lipid monitoring,see above.     Family/ staff Communication: staff/resident  Labs/tests ordered:  CBC CMP Dep level and uric acid

## 2018-02-05 DIAGNOSIS — N189 Chronic kidney disease, unspecified: Secondary | ICD-10-CM | POA: Diagnosis not present

## 2018-02-05 DIAGNOSIS — R569 Unspecified convulsions: Secondary | ICD-10-CM | POA: Diagnosis not present

## 2018-02-05 LAB — HEPATIC FUNCTION PANEL
ALK PHOS: 81 (ref 25–125)
ALT: 6 — AB (ref 10–40)
AST: 14 (ref 14–40)
BILIRUBIN, TOTAL: 0.2

## 2018-02-05 LAB — HM DIABETES FOOT EXAM

## 2018-02-05 LAB — CBC AND DIFFERENTIAL
HEMATOCRIT: 34 — AB (ref 41–53)
Hemoglobin: 11.1 — AB (ref 13.5–17.5)
PLATELETS: 210 (ref 150–399)
WBC: 8.5

## 2018-02-05 LAB — BASIC METABOLIC PANEL
BUN: 39 — AB (ref 4–21)
Creatinine: 1.5 — AB (ref 0.6–1.3)
GLUCOSE: 169
Potassium: 5.9 — AB (ref 3.4–5.3)
SODIUM: 142 (ref 137–147)

## 2018-02-11 NOTE — Assessment & Plan Note (Signed)
Continue depakote and namenda. Check dep level and CMP

## 2018-02-11 NOTE — Assessment & Plan Note (Signed)
Diet controlled A1C at goal <8%. Continue to monitor periodically.

## 2018-02-11 NOTE — Assessment & Plan Note (Signed)
Continue to periodically monitor BMP and avoid nephrotoxic agents  

## 2018-02-11 NOTE — Assessment & Plan Note (Signed)
Noted, without cardiac symptoms. BP controlled. No lipid monitoring,see above.

## 2018-02-11 NOTE — Assessment & Plan Note (Signed)
No s/s of prostate problems but it is difficult to assess given his advanced dementia. No further PSA monitoring due to goals of care.

## 2018-02-11 NOTE — Assessment & Plan Note (Signed)
Check uric acid. 

## 2018-03-05 ENCOUNTER — Non-Acute Institutional Stay (SKILLED_NURSING_FACILITY): Payer: Medicare Other | Admitting: Adult Health

## 2018-03-05 DIAGNOSIS — I10 Essential (primary) hypertension: Secondary | ICD-10-CM | POA: Diagnosis not present

## 2018-03-05 DIAGNOSIS — G301 Alzheimer's disease with late onset: Secondary | ICD-10-CM

## 2018-03-05 DIAGNOSIS — E875 Hyperkalemia: Secondary | ICD-10-CM | POA: Diagnosis not present

## 2018-03-05 DIAGNOSIS — M170 Bilateral primary osteoarthritis of knee: Secondary | ICD-10-CM

## 2018-03-05 DIAGNOSIS — N183 Chronic kidney disease, stage 3 unspecified: Secondary | ICD-10-CM

## 2018-03-05 DIAGNOSIS — F02818 Dementia in other diseases classified elsewhere, unspecified severity, with other behavioral disturbance: Secondary | ICD-10-CM

## 2018-03-05 DIAGNOSIS — M1A9XX1 Chronic gout, unspecified, with tophus (tophi): Secondary | ICD-10-CM

## 2018-03-05 DIAGNOSIS — F0281 Dementia in other diseases classified elsewhere with behavioral disturbance: Secondary | ICD-10-CM | POA: Diagnosis not present

## 2018-03-05 DIAGNOSIS — H409 Unspecified glaucoma: Secondary | ICD-10-CM

## 2018-03-05 NOTE — Assessment & Plan Note (Signed)
Controlled without medications.

## 2018-03-05 NOTE — Assessment & Plan Note (Signed)
Continue scheduled tylenol which controls his knee pain

## 2018-03-05 NOTE — Assessment & Plan Note (Signed)
See above Continue to periodically monitor BMP and avoid nephrotoxic agents

## 2018-03-05 NOTE — Assessment & Plan Note (Signed)
Not able to go for testing of intraocular eye pressures. Continue current medication at this time.

## 2018-03-05 NOTE — Assessment & Plan Note (Signed)
Treated, no symptoms. Not currently on an ace and appears well hydrated.? Progression in kidney disease. No additional work up or testing due to goals of care, age, progressive dementia.

## 2018-03-05 NOTE — Assessment & Plan Note (Signed)
Continue allopurinol 100 mg qd

## 2018-03-05 NOTE — Progress Notes (Signed)
Location:  [facility]up[facility] psychologist of Service:  SNF (31) Provider:  Cindi Carbon, ANP [place] [phone]   [name], DO  Patient Care Team: [name], DO as PCP - General (Geriatric Medicine) Raynelle Bring, MD as Consulting Physician (Urology) Jarome Matin, MD as Consulting Physician (Dermatology)  Extended Emergency Contact Information Primary Emergency Contact: Tindall,[name] Address: [address]          [place], [place] [address] [name] of [place] Phone: [phone] Mobile Phone: [phone] Relation: Son  Code Status: DNR Goals of care: Advanced Directive information Advanced Directives 12/09/2017  Does Patient Have a Medical Advance Directive? Yes  Type of Paramedic of [place];Out of facility DNR (pink MOST or yellow form)  Does patient want to make changes to medical advance directive? No - Patient declined  Copy of [place] in Chart? Yes  Would patient like information on creating a medical advance directive? -  Pre-existing out of facility DNR order (yellow form or pink MOST form) Yellow form placed in chart (order not valid for inpatient use);Pink MOST form placed in chart (order not valid for inpatient use)     Chief Complaint  Patient presents with  . Medical Management of Chronic Issues    HPI:  Pt is a 84 y.o. male seen today for medical management of chronic diseases.    Hx of macular degeneration and glaucoma: not able to test vision due to dementia.  He no longer goes for ophthalmology visits due to inappropriate behaviors associated with his dementia. Does not wear glasses.  AD: Not able to answer q's for MMSE testing. He is verbal and intermittently able to answer q's and follow commands but continues to have periods of aggression towards staff during care.  He still feeds himself and he is able to use his electric razor and brush his teeth with  cuing. Dep level 11 on 02/05/18  Gout: chronically on allopurinol, no recent flares. Uric acid 5.2 on 02/05/09  Hyperkalemia: K 5.9 in July and given a dose of kayexalate  OA to both knees: no complaints, does not bear weight  HTN: controlled  Functional status: hoyer lift and incontinent  Past Medical History:  Diagnosis Date  . Actinic keratosis   . Alzheimer disease 09/13/2008   Qualifier: Diagnosis of  By: Nils Pyle CMA ([place]), Mearl Latin    . Anemia 2012  . Arthritis   . Cervicalgia 1998   degenereative disease of CS and foraminal narrowing at C5-6  . Coronary atherosclerosis of native coronary artery 2002   60% stenois of the 1st diagonal of the LAD  . Deaf   . Diabetes mellitus 1997  . Epilepsy ([place]) 1943  . Gastric ulcer 2012  . Generalized osteoarthrosis, unspecified site   . Glaucoma   . H/O prostate cancer 2011   treated with radiation  . History of fall 2013  . Hyperlipidemia   . Hypertension 2008  . Macular degeneration (senile) of retina, unspecified   . Major depressive disorder, single episode, unspecified   . Osteoarthritis of knee 10/07/2013   Qualifier: Diagnosis of  By: Nils Pyle CMA (AAMA), Mearl Latin    . Other B-complex deficiencies 2012  . Personal history of colonic polyps 2012   adenomatous  . Reflux esophagitis 2002  . Type II or unspecified type diabetes mellitus with peripheral circulatory disorders, not stated as uncontrolled(250.70) 05/24/2009   Qualifier: Diagnosis of  By: Caryl Comes, MD, Caro Hight  [place]   . Unspecified venous (peripheral) insufficiency   . Unspecified vitamin D deficiency 2010   Past Surgical History:  Procedure Laterality Date  . APPENDECTOMY  1947  . CARPAL TUNNEL RELEASE Bilateral 2005  . COLONOSCOPY  10/22/10   multiple adenomatous polyps  . MYRINGOTOMY Right   . TONSILLECTOMY  1930    Allergies  Allergen Reactions  . Aspirin   . Nsaids     Outpatient Encounter Medications as of 03/05/2018  Medication Sig  .  acetaminophen (TYLENOL) 325 MG tablet Take 650 mg by mouth 3 (three) times daily.   Marland Kitchen allopurinol (ZYLOPRIM) 100 MG tablet Take 100 mg daily by mouth.  . Calcium 600-400 MG-UNIT CHEW Chew 1 tablet by mouth 2 (two) times daily.  . carboxymethylcellulose (REFRESH PLUS) 0.5 % SOLN 2 drops 2 (two) times daily.  . Cholecalciferol (VITAMIN D) 2000 UNITS CAPS Take by mouth.  . Cyanocobalamin (VITAMIN B 12 PO) Take 1,000 mcg by mouth daily.  . divalproex (DEPAKOTE SPRINKLE) 125 MG capsule Take 125 mg by mouth 2 (two) times daily.  . Eyelid Cleansers (OCUSOFT EYELID CLEANSING) PADS Apply 1 application topically daily.  . ferrous sulfate 325 (65 FE) MG tablet Take 325 mg by mouth daily with breakfast.  . ipratropium-albuterol (DUONEB) 0.5-2.5 (3) MG/3ML SOLN Take 3 mLs by nebulization every 6 (six) hours as needed.   Marland Kitchen ketoconazole (NIZORAL) 2 % shampoo Apply 1 application topically 2 (two) times a week.  . latanoprost (XALATAN) 0.005 % ophthalmic solution Place 1 drop into the right eye at bedtime.  Marland Kitchen loratadine (CLARITIN) 10 MG tablet Take 10 mg by mouth daily.   . Memantine HCl ER (NAMENDA XR) 28 MG CP24 Take by mouth.  . nystatin (NYSTATIN) powder Apply topically daily.  Marland Kitchen omeprazole (PRILOSEC) 20 MG capsule Take 20 mg by mouth 2 (two) times daily before a meal.  . polyethylene glycol (MIRALAX / GLYCOLAX) packet Take 17 g by mouth daily.  . [DISCONTINUED] dextromethorphan-guaiFENesin (ROBITUSSIN-DM) 10-100 MG/5ML liquid Take 10 mLs by mouth every 6 (six) hours as needed for cough.   No facility-administered encounter medications on file as of 03/05/2018.     Review of Systems  Unable to perform ROS: Dementia    Immunization History  Administered Date(s) Administered  . Influenza Inj Mdck Quad Pf 05/09/2016  . Influenza-Unspecified 05/21/2013, 04/26/2014, 05/04/2015, 05/15/2017  . Pneumococcal Conjugate-13 09/12/2016  . Pneumococcal Polysaccharide-23 10/12/2010  . Td 10/11/2011  . Zoster  10/17/2010  . Zoster Recombinat (Shingrix) 08/11/2017, 11/11/2017   Pertinent  Health Maintenance Due  Topic Date Due  . INFLUENZA VACCINE  02/19/2018  . HEMOGLOBIN A1C  06/25/2018  . FOOT EXAM  10/03/2018  . PNA vac Low Risk Adult  Completed  . OPHTHALMOLOGY EXAM  Discontinued  . URINE MICROALBUMIN  Discontinued   Fall Risk  11/25/2017 11/13/2016 03/16/2015 11/07/2014  Falls in the past year? No No No No  Risk for fall due to : - - History of fall(s) Impaired balance/gait;Impaired mobility;Medication side effect;History of fall(s)   Functional Status Survey:    Vitals:   03/05/18 1431  BP: (!) 145/85  Pulse: 68  Resp: 20  Temp: 98.1 F (36.7 C)  SpO2: 94%  Weight: 178 lb 8 oz (81 kg)   Body mass index is 28.81 kg/m.  Wt Readings from Last 3 Encounters:  03/05/18 178 lb 8 oz (81 kg)  02/11/18 175 lb 11.2 oz (79.7 kg)  01/08/18 175 lb 11.2 oz (79.7 kg)  Physical Exam  Constitutional: No distress.  HENT:  Head: Normocephalic and atraumatic.  Neck: No JVD present. No thyromegaly present.  Cardiovascular: Normal rate and regular rhythm.  No murmur heard. No edema. BPPP. Skin intact to both feet  Pulmonary/Chest: Effort normal and breath sounds normal.  Abdominal: Soft. Bowel sounds are normal. He exhibits no distension.  Lymphadenopathy:    He has no cervical adenopathy.  Neurological: He is alert.  Oriented to self only, able to f/c  Skin: Skin is warm and dry. He is not diaphoretic.  Psychiatric: He has a normal mood and affect.  Nursing note and vitals reviewed.   Labs reviewed: Recent Labs    06/09/17 02/05/18  NA 143 142  K 5.6* 5.9*  BUN 27* 39*  CREATININE 1.5* 1.5*   Recent Labs    04/03/17 0700 02/05/18  AST 12* 14  ALT 8* 6*  ALKPHOS 68 81   Recent Labs    02/05/18  WBC 8.5  HGB 11.1*  HCT 34*  PLT 210   Lab Results  Component Value Date   TSH 3.02 01/15/2017   Lab Results  Component Value Date   HGBA1C 7.0 12/24/2017   Lab  Results  Component Value Date   CHOL 150 09/17/2013   HDL 57 09/17/2013   LDLCALC 72 09/17/2013   TRIG 107 09/17/2013    Significant Diagnostic Results in last 30 days:  No results found.  Assessment/Plan  Essential hypertension Controlled without medications  Late onset Alzheimer's disease with behavioral disturbance He continues with periods of agitation, would continue Depakote for this reason. He is able to feed and shave himself and is verbal, will continue namenda for preservation.   Osteoarthritis of knee Continue scheduled tylenol which controls his knee pain  Tophaceous gout Continue allopurinol 100 mg qd  Glaucoma Not able to go for testing of intraocular eye pressures. Continue current medication at this time.   Hyperkalemia Treated, no symptoms. Not currently on an ace and appears well hydrated.? Progression in kidney disease. No additional work up or testing due to goals of care, age, progressive dementia.   CKD (chronic kidney disease) stage 3, GFR 30-59 ml/min See above Continue to periodically monitor BMP and avoid nephrotoxic agents    Family/ staff Communication: staff  Labs/tests ordered:  NA

## 2018-03-05 NOTE — Assessment & Plan Note (Signed)
He continues with periods of agitation, would continue Depakote for this reason. He is able to feed and shave himself and is verbal, will continue namenda for preservation.

## 2018-04-13 ENCOUNTER — Encounter: Payer: Self-pay | Admitting: Adult Health

## 2018-04-13 ENCOUNTER — Non-Acute Institutional Stay (SKILLED_NURSING_FACILITY): Payer: Medicare Other | Admitting: Adult Health

## 2018-04-13 DIAGNOSIS — N183 Chronic kidney disease, stage 3 unspecified: Secondary | ICD-10-CM

## 2018-04-13 DIAGNOSIS — L219 Seborrheic dermatitis, unspecified: Secondary | ICD-10-CM | POA: Diagnosis not present

## 2018-04-13 DIAGNOSIS — N184 Chronic kidney disease, stage 4 (severe): Secondary | ICD-10-CM | POA: Diagnosis not present

## 2018-04-13 DIAGNOSIS — E1122 Type 2 diabetes mellitus with diabetic chronic kidney disease: Secondary | ICD-10-CM

## 2018-04-13 DIAGNOSIS — J411 Mucopurulent chronic bronchitis: Secondary | ICD-10-CM

## 2018-04-13 DIAGNOSIS — H7292 Unspecified perforation of tympanic membrane, left ear: Secondary | ICD-10-CM | POA: Diagnosis not present

## 2018-04-13 DIAGNOSIS — H65491 Other chronic nonsuppurative otitis media, right ear: Secondary | ICD-10-CM

## 2018-04-13 NOTE — Progress Notes (Signed)
Location:  Occupational psychologist of Service:  SNF (31) Provider:   Cindi Carbon, ANP [place] [phone]   [name], DO  Patient Care Team: [name], DO as PCP - General (Geriatric Medicine) Raynelle Bring, MD as Consulting Physician (Urology) Jarome Matin, MD as Consulting Physician (Dermatology)  Extended Emergency Contact Information Primary Emergency Contact: Depass,[name] Address: [address]          [place], [place] [address] [name] of [place] Phone: [phone] Mobile Phone: [phone] Relation: Son  Code Status:  DNR Goals of care: Advanced Directive information Advanced Directives 12/09/2017  Does Patient Have a Medical Advance Directive? Yes  Type of Paramedic of [place];Out of facility DNR (pink MOST or yellow form)  Does patient want to make changes to medical advance directive? No - Patient declined  Copy of [place] in Chart? Yes  Would patient like information on creating a medical advance directive? -  Pre-existing out of facility DNR order (yellow form or pink MOST form) Yellow form placed in chart (order not valid for inpatient use);Pink MOST form placed in chart (order not valid for inpatient use)     Chief Complaint  Patient presents with  . Medical Management of Chronic Issues    HPI:  Pt is a 84 y.o. male seen today for medical management of chronic diseases.    Dm II: diet controlled  Lab Results  Component Value Date   HGBA1C 7.0 12/24/2017   CKD IV Lab Results  Component Value Date   BUN 39 (A) 02/05/2018   Lab Results  Component Value Date   CREATININE 1.5 (A) 02/05/2018   Nurse reports red itchy and scaly rash to his face that is chronic  Also reports of pulling at left ear with no fever or drainage  Has a hx of chronic rhinitis and chronic productive cough   Functional status: incontinent and a hoyer lift for  tranfers  Past Medical History:  Diagnosis Date  . Actinic keratosis   . Alzheimer disease 09/13/2008   Qualifier: Diagnosis of  By: Nils Pyle CMA ([place]), Mearl Latin    . Anemia 2012  . Arthritis   . Cervicalgia 1998   degenereative disease of CS and foraminal narrowing at C5-6  . Coronary atherosclerosis of native coronary artery 2002   60% stenois of the 1st diagonal of the LAD  . Deaf   . Diabetes mellitus 1997  . Epilepsy ([place]) 1943  . Gastric ulcer 2012  . Generalized osteoarthrosis, unspecified site   . Glaucoma   . H/O prostate cancer 2011   treated with radiation  . History of fall 2013  . Hyperlipidemia   . Hypertension 2008  . Macular degeneration (senile) of retina, unspecified   . Major depressive disorder, single episode, unspecified   . Osteoarthritis of knee 10/07/2013   Qualifier: Diagnosis of  By: Nils Pyle CMA (AAMA), Mearl Latin    . Other B-complex deficiencies 2012  . Personal history of colonic polyps 2012   adenomatous  . Reflux esophagitis 2002  . Type II or unspecified type diabetes mellitus with peripheral circulatory disorders, not stated as uncontrolled(250.70) 05/24/2009   Qualifier: Diagnosis of  By: Caryl Comes, MD, Remus Blake   . Unspecified venous (peripheral) insufficiency   . Unspecified vitamin D deficiency 2010   Past Surgical History:  Procedure Laterality Date  . APPENDECTOMY  1947  . CARPAL TUNNEL RELEASE Bilateral 2005  . COLONOSCOPY  10/22/10   multiple adenomatous polyps  . MYRINGOTOMY Right   . TONSILLECTOMY  1930    Allergies  Allergen Reactions  . Aspirin   . Nsaids     Outpatient Encounter Medications as of 04/13/2018  Medication Sig  . acetaminophen (TYLENOL) 325 MG tablet Take 650 mg by mouth 3 (three) times daily.   Marland Kitchen allopurinol (ZYLOPRIM) 100 MG tablet Take 100 mg daily by mouth.  . Calcium 600-400 MG-UNIT CHEW Chew 1 tablet by mouth 2 (two) times daily.  . carboxymethylcellulose (REFRESH PLUS) 0.5 % SOLN 2 drops 2 (two)  times daily.  . Cholecalciferol (VITAMIN D) 2000 UNITS CAPS Take by mouth.  . Cyanocobalamin (VITAMIN B 12 PO) Take 1,000 mcg by mouth daily.  . divalproex (DEPAKOTE SPRINKLE) 125 MG capsule Take 125 mg by mouth 2 (two) times daily.  . Eyelid Cleansers (OCUSOFT EYELID CLEANSING) PADS Apply 1 application topically daily.  . ferrous sulfate 325 (65 FE) MG tablet Take 325 mg by mouth daily with breakfast.  . ipratropium-albuterol (DUONEB) 0.5-2.5 (3) MG/3ML SOLN Take 3 mLs by nebulization every 6 (six) hours as needed.   Marland Kitchen ketoconazole (NIZORAL) 2 % shampoo Apply 1 application topically 2 (two) times a week.  . latanoprost (XALATAN) 0.005 % ophthalmic solution Place 1 drop into the right eye at bedtime.  Marland Kitchen loratadine (CLARITIN) 10 MG tablet Take 10 mg by mouth daily.   . Memantine HCl ER (NAMENDA XR) 28 MG CP24 Take by mouth.  . nystatin (NYSTATIN) powder Apply topically daily.  Marland Kitchen omeprazole (PRILOSEC) 20 MG capsule Take 20 mg by mouth 2 (two) times daily before a meal.  . polyethylene glycol (MIRALAX / GLYCOLAX) packet Take 17 g by mouth daily.   No facility-administered encounter medications on file as of 04/13/2018.     Review of Systems  Unable to perform ROS: Dementia    Immunization History  Administered Date(s) Administered  . Influenza Inj Mdck Quad Pf 05/09/2016  . Influenza-Unspecified 05/21/2013, 04/26/2014, 05/04/2015, 05/15/2017  . Pneumococcal Conjugate-13 09/12/2016  . Pneumococcal Polysaccharide-23 10/12/2010  . Td 10/11/2011  . Zoster 10/17/2010  . Zoster Recombinat (Shingrix) 08/11/2017, 11/11/2017   Pertinent  Health Maintenance Due  Topic Date Due  . INFLUENZA VACCINE  02/19/2018  . HEMOGLOBIN A1C  06/25/2018  . FOOT EXAM  02/06/2019  . PNA vac Low Risk Adult  Completed  . OPHTHALMOLOGY EXAM  Discontinued  . URINE MICROALBUMIN  Discontinued   Fall Risk  11/25/2017 11/13/2016 03/16/2015 11/07/2014  Falls in the past year? No No No No  Risk for fall due to : - -  History of fall(s) Impaired balance/gait;Impaired mobility;Medication side effect;History of fall(s)   Functional Status Survey:    There were no vitals filed for this visit. There is no height or weight on file to calculate BMI. Physical Exam  Constitutional: He is oriented to person, place, and time. No distress.  HENT:  Head: Normocephalic and atraumatic.  Right Ear: There is drainage, swelling and tenderness. Tympanic membrane is perforated. Decreased hearing is noted.  Left Ear: Tympanic membrane, external ear and ear canal normal. No drainage, swelling or tenderness. Tympanic membrane is not injected and not perforated. Decreased hearing is noted.  Nose: Rhinorrhea present. No mucosal edema or sinus tenderness. Right sinus exhibits no maxillary sinus tenderness and no frontal sinus tenderness. Left sinus exhibits no maxillary sinus tenderness and no frontal sinus tenderness.  Mouth/Throat: Oropharynx is clear and moist. No oropharyngeal exudate.  Neck: Normal range of motion.  Neck supple. No JVD present. No tracheal deviation present. No thyromegaly present.  Cardiovascular: Normal rate and regular rhythm.  No murmur heard. Pulmonary/Chest: Effort normal and breath sounds normal. No respiratory distress. He has no wheezes.  Abdominal: Soft. Bowel sounds are normal. He exhibits no distension. There is no tenderness.  Lymphadenopathy:    He has no cervical adenopathy.  Neurological: He is alert and oriented to person, place, and time. No cranial nerve deficit.  Skin: Skin is warm and dry. He is not diaphoretic.  Psychiatric: He has a normal mood and affect.    Labs reviewed: Recent Labs    06/09/17 02/05/18  NA 143 142  K 5.6* 5.9*  BUN 27* 39*  CREATININE 1.5* 1.5*   Recent Labs    02/05/18  AST 14  ALT 6*  ALKPHOS 81   Recent Labs    02/05/18  WBC 8.5  HGB 11.1*  HCT 34*  PLT 210   Lab Results  Component Value Date   TSH 3.02 01/15/2017   Lab Results    Component Value Date   HGBA1C 7.0 12/24/2017   Lab Results  Component Value Date   CHOL 150 09/17/2013   HDL 57 09/17/2013   LDLCALC 72 09/17/2013   TRIG 107 09/17/2013    Significant Diagnostic Results in last 30 days:  No results found.  Assessment/Plan 1. Tympanic membrane perforation, left Due to chronic sinus issues and subsequent infection. He is not a good candidate for various nasal sprays as he would not inhale and tends to resist care. Previous ENT referral not helpful. Avoid water in the ears, see below  2. Other chronic nonsuppurative otitis media of right ear Augmentin 875 mg BID x 7 days  Florastor 1 cap BID x 7 days  3. Mucopurulent chronic bronchitis ([place]) Controlled Continue duonebs prn, not scheduled as he tended to take them off his face and became agitated.   4. Type 2 diabetes mellitus with stage 4 chronic kidney disease, without long-term current use of insulin (HCC) Check A1C q 6 months  5. CKD (chronic kidney disease) stage 3, GFR 30-59 ml/min (HCC) Continue to periodically monitor BMP and avoid nephrotoxic agents  6. Seborrheic dermatitis Ciclopirox 0.77% BID to face and neck x 1 week     Family/ staff Communication:staff  Labs/tests ordered:  NA

## 2018-05-14 ENCOUNTER — Encounter: Payer: Self-pay | Admitting: Adult Health

## 2018-05-14 ENCOUNTER — Non-Acute Institutional Stay (SKILLED_NURSING_FACILITY): Payer: Medicare Other | Admitting: Adult Health

## 2018-05-14 DIAGNOSIS — J690 Pneumonitis due to inhalation of food and vomit: Secondary | ICD-10-CM

## 2018-05-14 DIAGNOSIS — R0902 Hypoxemia: Secondary | ICD-10-CM | POA: Diagnosis not present

## 2018-05-14 DIAGNOSIS — R569 Unspecified convulsions: Secondary | ICD-10-CM

## 2018-05-14 DIAGNOSIS — R638 Other symptoms and signs concerning food and fluid intake: Secondary | ICD-10-CM

## 2018-05-14 NOTE — Progress Notes (Signed)
Location:  Occupational psychologist of Service:  SNF (31) Provider:   Cindi Carbon, ANP [place] [phone]  [name], DO  Patient Care Team: [name], DO as PCP - General (Geriatric Medicine) Raynelle Bring, MD as Consulting Physician (Urology) Jarome Matin, MD as Consulting Physician (Dermatology)  Extended Emergency Contact Information Primary Emergency Contact: Cathey,[name] Address: [address]          [place], [place] [address] [name] of [place] Phone: [phone] Mobile Phone: [phone] Relation: Son  Code Status:  DNR Goals of care: Advanced Directive information Advanced Directives 12/09/2017  Does Patient Have a Medical Advance Directive? Yes  Type of Paramedic of [place];Out of facility DNR (pink MOST or yellow form)  Does patient want to make changes to medical advance directive? No - Patient declined  Copy of [place] in Chart? Yes  Would patient like information on creating a medical advance directive? -  Pre-existing out of facility DNR order (yellow form or pink MOST form) Yellow form placed in chart (order not valid for inpatient use);Pink MOST form placed in chart (order not valid for inpatient use)     Chief Complaint  Patient presents with  . Acute Visit    end of life    HPI:  Pt is a 84 y.o. male seen today for an acute visit for cough, sob associated with an aspiration event. ON 10/22 he developed a worsening cough, congestion, lethargy, and decreased 02 sats.  Oxygen was applied, and suction placed to the bedside. Roxanol was ordered for sob as his goals of care were comfort based. He has not eaten since that time and has remained in bed. For my visit he is looking up and to the right and has a tremor to both hands. He is coughing frequently and needs oral suctioning. Sats are in the 85% range.   Past Medical History:  Diagnosis Date  .  Actinic keratosis   . Alzheimer disease ([place]) 09/13/2008   Qualifier: Diagnosis of  By: Nils Pyle CMA (AAMA), Mearl Latin    . Anemia 2012  . Arthritis   . Cervicalgia 1998   degenereative disease of CS and foraminal narrowing at C5-6  . Coronary atherosclerosis of native coronary artery 2002   60% stenois of the 1st diagonal of the LAD  . Deaf   . Diabetes mellitus 1997  . Epilepsy ([place]) 1943  . Gastric ulcer 2012  . Generalized osteoarthrosis, unspecified site   . Glaucoma   . H/O prostate cancer 2011   treated with radiation  . History of fall 2013  . Hyperlipidemia   . Hypertension 2008  . Macular degeneration (senile) of retina, unspecified   . Major depressive disorder, single episode, unspecified   . Osteoarthritis of knee 10/07/2013   Qualifier: Diagnosis of  By: Nils Pyle CMA (AAMA), Mearl Latin    . Other B-complex deficiencies 2012  . Personal history of colonic polyps 2012   adenomatous  . Reflux esophagitis 2002  . Type II or unspecified type diabetes mellitus with peripheral circulatory disorders, not stated as uncontrolled(250.70) 05/24/2009   Qualifier: Diagnosis of  By: Caryl Comes, MD, Remus Blake   . Unspecified venous (peripheral) insufficiency   . Unspecified vitamin D deficiency 2010   Past Surgical History:  Procedure Laterality Date  . APPENDECTOMY  1947  . CARPAL TUNNEL RELEASE Bilateral 2005  . COLONOSCOPY  10/22/10   multiple adenomatous polyps  .  MYRINGOTOMY Right   . TONSILLECTOMY  1930    Allergies  Allergen Reactions  . Aspirin   . Nsaids     Outpatient Encounter Medications as of 05/14/2018  Medication Sig  . morphine (ROXANOL) 20 MG/ML concentrated solution Take 5 mg by mouth every 2 (two) hours as needed for severe pain.  Marland Kitchen acetaminophen (TYLENOL) 325 MG tablet Take 650 mg by mouth 3 (three) times daily.   Marland Kitchen allopurinol (ZYLOPRIM) 100 MG tablet Take 100 mg daily by mouth.  . Calcium 600-400 MG-UNIT CHEW Chew 1 tablet by mouth 2 (two) times daily.    . carboxymethylcellulose (REFRESH PLUS) 0.5 % SOLN 2 drops 2 (two) times daily.  . Cholecalciferol (VITAMIN D) 2000 UNITS CAPS Take by mouth.  . Cyanocobalamin (VITAMIN B 12 PO) Take 1,000 mcg by mouth daily.  . divalproex (DEPAKOTE SPRINKLE) 125 MG capsule Take 125 mg by mouth 2 (two) times daily.  . Eyelid Cleansers (OCUSOFT EYELID CLEANSING) PADS Apply 1 application topically daily.  . ferrous sulfate 325 (65 FE) MG tablet Take 325 mg by mouth daily with breakfast.  . ipratropium-albuterol (DUONEB) 0.5-2.5 (3) MG/3ML SOLN Take 3 mLs by nebulization every 6 (six) hours as needed.   Marland Kitchen ketoconazole (NIZORAL) 2 % shampoo Apply 1 application topically 2 (two) times a week.  . latanoprost (XALATAN) 0.005 % ophthalmic solution Place 1 drop into the right eye at bedtime.  Marland Kitchen loratadine (CLARITIN) 10 MG tablet Take 10 mg by mouth daily.   . Memantine HCl ER (NAMENDA XR) 28 MG CP24 Take by mouth.  . nystatin (NYSTATIN) powder Apply topically daily.  Marland Kitchen omeprazole (PRILOSEC) 20 MG capsule Take 20 mg by mouth 2 (two) times daily before a meal.  . polyethylene glycol (MIRALAX / GLYCOLAX) packet Take 17 g by mouth daily.   No facility-administered encounter medications on file as of 05/14/2018.     Review of Systems  Unable to perform ROS: Acuity of condition    Immunization History  Administered Date(s) Administered  . Influenza Inj Mdck Quad Pf 05/09/2016  . Influenza-Unspecified 05/21/2013, 04/26/2014, 05/04/2015, 05/15/2017  . Pneumococcal Conjugate-13 09/12/2016  . Pneumococcal Polysaccharide-23 10/12/2010  . Td 10/11/2011  . Zoster 10/17/2010  . Zoster Recombinat (Shingrix) 08/11/2017, 11/11/2017   Pertinent  Health Maintenance Due  Topic Date Due  . INFLUENZA VACCINE  02/19/2018  . HEMOGLOBIN A1C  06/25/2018  . FOOT EXAM  02/06/2019  . PNA vac Low Risk Adult  Completed  . OPHTHALMOLOGY EXAM  Discontinued  . URINE MICROALBUMIN  Discontinued   Fall Risk  11/25/2017 11/13/2016  03/16/2015 11/07/2014  Falls in the past year? No No No No  Risk for fall due to : - - History of fall(s) Impaired balance/gait;Impaired mobility;Medication side effect;History of fall(s)   Functional Status Survey:    Vitals:   05/14/18 1017  BP: 124/75  Pulse: 71  Resp: (!) 22  Temp: 98.3 F (36.8 C)  SpO2: (!) 85%   There is no height or weight on file to calculate BMI. Physical Exam  Constitutional: He appears well-developed and well-nourished. He appears distressed (mild).  HENT:  Head: Normocephalic and atraumatic.  Nose: Nose normal.  Very dry mucous membranes  Neck: No JVD present.  Cardiovascular: Normal rate and regular rhythm.  No murmur heard. Pulmonary/Chest: No stridor. He has no wheezes.  Bilateral rhonchi with slight increased wob and accessory muscle use  Abdominal: Bowel sounds are normal.  Lymphadenopathy:    He has no cervical adenopathy.  Neurological:  Lethargic, not verbal. Eyes fixed to the left. Tremor noted to both hands.   Skin: Skin is warm and dry.  Nursing note and vitals reviewed.   Labs reviewed: Recent Labs    06/09/17 02/05/18  NA 143 142  K 5.6* 5.9*  BUN 27* 39*  CREATININE 1.5* 1.5*   Recent Labs    02/05/18  AST 14  ALT 6*  ALKPHOS 81   Recent Labs    02/05/18  WBC 8.5  HGB 11.1*  HCT 34*  PLT 210   Lab Results  Component Value Date   TSH 3.02 01/15/2017   Lab Results  Component Value Date   HGBA1C 7.0 12/24/2017   Lab Results  Component Value Date   CHOL 150 09/17/2013   HDL 57 09/17/2013   LDLCALC 72 09/17/2013   TRIG 107 09/17/2013    Significant Diagnostic Results in last 30 days:  No results found.  Assessment/Plan  1. Aspiration pneumonia due to regurgitated food, unspecified laterality, unspecified part of lung ([place]) He is going through the dying process likely due to aspiration. He extremities remain warm without cyanosis. He has some shortness of breath which is relieved with morphine. Will  add atropine 1% gtts orally qid prn secretions. I spoke with his son Gaspar Bidding and let him know that he is progressing through the dying process and has not had anything to eat for the past two days. He expressed understanding and appreciation for our care. He wishes for him to be comfortable and does not want any treatments for his condition.   2. Hypoxia Oxygen 2L for comfort only  3. Poor fluid intake Due to altered mental status he is not able to eat or drink. Oral care should be done q 4 hrs for dry mouth.  Avoid IVF due to goals of care.   4. Seizures ? If this was seizure activity with tremors and fixed gaze Will add Ativan 0.5 mg q 4 hrs prn agitation or seizures.   Family/ staff Communication: discussed with son Gaspar Bidding  Labs/tests ordered:  NA

## 2018-05-14 NOTE — ACP (Advance Care Planning) (Signed)
He is going through the dying process likely due to aspiration. He extremities remain warm without cyanosis. He has some shortness of breath which is relieved with morphine. Will add atropine 1% gtts orally qid prn secretions. I spoke with his son Gaspar Bidding and let him know that he is progressing through the dying process and has not had anything to eat for the past two days. He expressed understanding and appreciation for our care. He wishes for him to be comfortable and does not want any treatments for his condition.

## 2018-05-21 DIAGNOSIS — Z23 Encounter for immunization: Secondary | ICD-10-CM | POA: Diagnosis not present

## 2018-05-22 DEATH — deceased
